# Patient Record
Sex: Female | Born: 1955 | Race: White | Hispanic: No | Marital: Married | Smoking: Never smoker
Health system: Southern US, Community
[De-identification: ages and names within clinical notes are randomized; demographics above are authoritative.]

## PROBLEM LIST (undated history)

## (undated) ENCOUNTER — Ambulatory Visit: Admission: EM | Disposition: A | Discharge status: Home / Self Care | Payer: 59

## (undated) DIAGNOSIS — B2790 Infectious mononucleosis, unspecified without complication: Secondary | ICD-10-CM

## (undated) DIAGNOSIS — C4490 Unspecified malignant neoplasm of skin, unspecified: Secondary | ICD-10-CM

## (undated) DIAGNOSIS — E039 Hypothyroidism, unspecified: Secondary | ICD-10-CM

## (undated) DIAGNOSIS — K929 Disease of digestive system, unspecified: Secondary | ICD-10-CM

## (undated) DIAGNOSIS — M810 Age-related osteoporosis without current pathological fracture: Secondary | ICD-10-CM

## (undated) DIAGNOSIS — E7800 Pure hypercholesterolemia, unspecified: Secondary | ICD-10-CM

## (undated) DIAGNOSIS — B379 Candidiasis, unspecified: Secondary | ICD-10-CM

## (undated) DIAGNOSIS — Z923 Personal history of irradiation: Secondary | ICD-10-CM

## (undated) HISTORY — DX: Age-related osteoporosis without current pathological fracture: M81.0

## (undated) HISTORY — DX: Disease of digestive system, unspecified: K92.9

## (undated) HISTORY — PX: BREAST SURGERY: SHX581

## (undated) HISTORY — PX: OTHER SURGICAL HISTORY: SHX169

## (undated) HISTORY — PX: TONSILLECTOMY: SUR1361

---

## 2009-05-09 ENCOUNTER — Encounter: Admission: RE | Admit: 2009-05-09 | Discharge: 2009-05-09 | Payer: Self-pay | Admitting: Gynecology

## 2012-02-28 ENCOUNTER — Other Ambulatory Visit: Payer: Self-pay | Admitting: Family Medicine

## 2012-02-28 DIAGNOSIS — Z1231 Encounter for screening mammogram for malignant neoplasm of breast: Secondary | ICD-10-CM

## 2012-03-12 ENCOUNTER — Ambulatory Visit
Admission: RE | Admit: 2012-03-12 | Discharge: 2012-03-12 | Disposition: A | Discharge status: Home / Self Care | Payer: BC Managed Care – PPO | Source: Ambulatory Visit | Attending: Family Medicine | Admitting: Family Medicine

## 2012-03-12 DIAGNOSIS — Z1231 Encounter for screening mammogram for malignant neoplasm of breast: Secondary | ICD-10-CM

## 2013-03-11 ENCOUNTER — Ambulatory Visit: Payer: BC Managed Care – PPO | Admitting: Internal Medicine

## 2014-02-03 ENCOUNTER — Other Ambulatory Visit: Payer: Self-pay

## 2014-03-30 ENCOUNTER — Encounter: Payer: Self-pay | Admitting: Internal Medicine

## 2014-03-30 ENCOUNTER — Ambulatory Visit (HOSPITAL_BASED_OUTPATIENT_CLINIC_OR_DEPARTMENT_OTHER)
Admission: RE | Admit: 2014-03-30 | Discharge: 2014-03-30 | Disposition: A | Discharge status: Home / Self Care | Payer: Managed Care, Other (non HMO) | Source: Ambulatory Visit | Attending: Internal Medicine | Admitting: Internal Medicine

## 2014-03-30 ENCOUNTER — Ambulatory Visit (INDEPENDENT_AMBULATORY_CARE_PROVIDER_SITE_OTHER): Payer: Managed Care, Other (non HMO) | Admitting: Internal Medicine

## 2014-03-30 VITALS — BP 95/60 | HR 80 | Temp 98.4°F | Resp 18 | Ht 64.0 in | Wt 111.0 lb

## 2014-03-30 DIAGNOSIS — N6019 Diffuse cystic mastopathy of unspecified breast: Secondary | ICD-10-CM

## 2014-03-30 DIAGNOSIS — N951 Menopausal and female climacteric states: Secondary | ICD-10-CM

## 2014-03-30 DIAGNOSIS — C4491 Basal cell carcinoma of skin, unspecified: Secondary | ICD-10-CM | POA: Insufficient documentation

## 2014-03-30 DIAGNOSIS — Z803 Family history of malignant neoplasm of breast: Secondary | ICD-10-CM

## 2014-03-30 DIAGNOSIS — E785 Hyperlipidemia, unspecified: Secondary | ICD-10-CM

## 2014-03-30 DIAGNOSIS — M542 Cervicalgia: Secondary | ICD-10-CM

## 2014-03-30 DIAGNOSIS — D126 Benign neoplasm of colon, unspecified: Secondary | ICD-10-CM

## 2014-03-30 DIAGNOSIS — M5030 Other cervical disc degeneration, unspecified cervical region: Secondary | ICD-10-CM | POA: Diagnosis not present

## 2014-03-30 DIAGNOSIS — M47812 Spondylosis without myelopathy or radiculopathy, cervical region: Secondary | ICD-10-CM | POA: Insufficient documentation

## 2014-03-30 DIAGNOSIS — M4120 Other idiopathic scoliosis, site unspecified: Secondary | ICD-10-CM | POA: Diagnosis not present

## 2014-03-30 DIAGNOSIS — Z139 Encounter for screening, unspecified: Secondary | ICD-10-CM

## 2014-03-30 DIAGNOSIS — M810 Age-related osteoporosis without current pathological fracture: Secondary | ICD-10-CM

## 2014-03-30 DIAGNOSIS — Z780 Asymptomatic menopausal state: Secondary | ICD-10-CM

## 2014-03-30 DIAGNOSIS — K635 Polyp of colon: Secondary | ICD-10-CM

## 2014-03-30 LAB — CBC WITH DIFFERENTIAL/PLATELET
BASOS ABS: 0 10*3/uL (ref 0.0–0.1)
BASOS PCT: 0 % (ref 0–1)
EOS ABS: 0.2 10*3/uL (ref 0.0–0.7)
Eosinophils Relative: 3 % (ref 0–5)
HEMATOCRIT: 40.9 % (ref 36.0–46.0)
Hemoglobin: 13.6 g/dL (ref 12.0–15.0)
Lymphocytes Relative: 26 % (ref 12–46)
Lymphs Abs: 1.9 10*3/uL (ref 0.7–4.0)
MCH: 30 pg (ref 26.0–34.0)
MCHC: 33.3 g/dL (ref 30.0–36.0)
MCV: 90.3 fL (ref 78.0–100.0)
MONO ABS: 0.6 10*3/uL (ref 0.1–1.0)
Monocytes Relative: 8 % (ref 3–12)
NEUTROS ABS: 4.7 10*3/uL (ref 1.7–7.7)
NEUTROS PCT: 63 % (ref 43–77)
Platelets: 280 10*3/uL (ref 150–400)
RBC: 4.53 MIL/uL (ref 3.87–5.11)
RDW: 12.4 % (ref 11.5–15.5)
WBC: 7.4 10*3/uL (ref 4.0–10.5)
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
## 2014-03-30 NOTE — Patient Instructions (Signed)
[facility] up [facility] D [facility]    Will refer to [facility]     See me in 2-3 weeks  Leave 30 mins

## 2014-03-31 ENCOUNTER — Telehealth: Payer: Self-pay | Admitting: *Deleted

## 2014-03-31 LAB — VITAMIN D 25 HYDROXY (VIT D DEFICIENCY, FRACTURES): Vit D, 25-Hydroxy: 96 ng/mL — ABNORMAL HIGH (ref 30–89)

## 2014-03-31 LAB — COMPREHENSIVE METABOLIC PANEL
ALBUMIN: 4.8 g/dL (ref 3.5–5.2)
ALT: 26 U/L (ref 0–35)
AST: 29 U/L (ref 0–37)
Alkaline Phosphatase: 90 U/L (ref 39–117)
BUN: 24 mg/dL — ABNORMAL HIGH (ref 6–23)
CALCIUM: 10.2 mg/dL (ref 8.4–10.5)
CHLORIDE: 103 meq/L (ref 96–112)
CO2: 27 mEq/L (ref 19–32)
Creat: 0.67 mg/dL (ref 0.50–1.10)
Glucose, Bld: 82 mg/dL (ref 70–99)
POTASSIUM: 4.5 meq/L (ref 3.5–5.3)
Sodium: 139 mEq/L (ref 135–145)
Total Bilirubin: 0.4 mg/dL (ref 0.2–1.2)
Total Protein: 7.5 g/dL (ref 6.0–8.3)

## 2014-03-31 LAB — LIPID PANEL
CHOLESTEROL: 261 mg/dL — AB (ref 0–200)
HDL: 93 mg/dL (ref >39)
LDL Cholesterol: 152 mg/dL — ABNORMAL HIGH (ref 0–99)
Total CHOL/HDL Ratio: 2.8 Ratio
Triglycerides: 82 mg/dL (ref <150)
VLDL: 16 mg/dL (ref 0–40)

## 2014-03-31 LAB — TSH: TSH: 2.073 u[IU]/mL (ref 0.350–4.500)

## 2014-03-31 NOTE — Telephone Encounter (Signed)
Left VM message for pt to return call.

## 2014-03-31 NOTE — Telephone Encounter (Signed)
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

## 2014-04-03 ENCOUNTER — Telehealth: Payer: Self-pay | Admitting: Internal Medicine

## 2014-04-03 DIAGNOSIS — Z780 Asymptomatic menopausal state: Secondary | ICD-10-CM | POA: Insufficient documentation

## 2014-04-03 NOTE — Telephone Encounter (Signed)
Paula [name]  Call pt and let her know that her cholesterol is quite high and I wish to see her in office to discuss this and see if she is losing weight.  Labs will be coming to her in the mail    Give her a 30 min office visit in one month  Message back with date of visit

## 2014-04-04 NOTE — Telephone Encounter (Signed)
Left a message for [place] to give [place] a call.

## 2014-04-07 ENCOUNTER — Other Ambulatory Visit: Payer: Self-pay | Admitting: Internal Medicine

## 2014-04-07 ENCOUNTER — Ambulatory Visit
Admission: RE | Admit: 2014-04-07 | Discharge: 2014-04-07 | Disposition: A | Discharge status: Home / Self Care | Payer: Managed Care, Other (non HMO) | Source: Ambulatory Visit | Attending: Internal Medicine | Admitting: Internal Medicine

## 2014-04-07 ENCOUNTER — Ambulatory Visit: Admission: RE | Admit: 2014-04-07 | Payer: Managed Care, Other (non HMO) | Source: Ambulatory Visit

## 2014-04-07 DIAGNOSIS — Z139 Encounter for screening, unspecified: Secondary | ICD-10-CM

## 2014-04-20 ENCOUNTER — Ambulatory Visit (INDEPENDENT_AMBULATORY_CARE_PROVIDER_SITE_OTHER): Payer: Managed Care, Other (non HMO) | Admitting: Internal Medicine

## 2014-04-20 ENCOUNTER — Encounter: Payer: Self-pay | Admitting: Internal Medicine

## 2014-04-20 VITALS — BP 99/58 | HR 76 | Resp 16 | Ht 64.0 in | Wt 113.0 lb

## 2014-04-20 DIAGNOSIS — E785 Hyperlipidemia, unspecified: Secondary | ICD-10-CM

## 2014-04-20 DIAGNOSIS — M542 Cervicalgia: Secondary | ICD-10-CM

## 2014-04-20 NOTE — Progress Notes (Signed)
Subjective:    Patient ID: Paula [name], female    DOB: 01-Dec-1955, 58 y.o.   MRN: [id number]  HPI  [name] is here for follow up  Neck pain is better  Xray neg  See lipids  She repeatedly declines RX meds    No Known Allergies Past Medical History  Diagnosis Date  . Osteoporosis   . Digestive system disease    Past Surgical History  Procedure Laterality Date  . Breast surgery      right breast biopsy   History   Social History  . Marital Status: Married    Spouse Name: N/A    Number of Children: N/A  . Years of Education: N/A   Occupational History  . Not on file.   Social History Main Topics  . Smoking status: Former Smoker    Quit date: 03/30/1976  . Smokeless tobacco: Never Used  . Alcohol Use: No  . Drug Use: No  . Sexual Activity: Yes    Birth Control/ Protection: Post-menopausal   Other Topics Concern  . Not on file   Social History Narrative  . No narrative on file   Family History  Problem Relation Age of Onset  . Cancer Mother 4    breast  . Cancer Father     pancreatic  . Cancer Sister 82    breast  . Heart disease Maternal Grandfather   . Cancer Paternal Grandmother 37    lymphoma  . Cancer Paternal Grandfather 44    stomach   Patient Active Problem List   Diagnosis Date Noted  . Menopause 04/03/2014  . Basal cell carcinoma of skin 03/30/2014  . Hyperlipidemia 03/30/2014  . Neck pain 03/30/2014  . Colon polyp 03/30/2014  . Family history of breast cancer in first degree relative  MGM, mother and sister 03/30/2014  . Fibrocystic breast disease 03/30/2014  . Osteoporosis  last Dexa  02/2012 03/30/2014   Current Outpatient Prescriptions on File Prior to Visit  Medication Sig Dispense Refill  . Ascorbic Acid (VITAMIN C) 100 MG tablet Take 100 mg by mouth 2 (two) times daily.      . cholecalciferol (VITAMIN D) 1000 UNITS tablet Take 1,000 Units by mouth daily.      . COD LIVER OIL PO Take 1 tablet by mouth daily.      Marland Kitchen GARLIC  OIL PO Take by mouth 3 (three) times daily with meals.      Marland Kitchen GLUTAMINE PO Take by mouth. For leaky gut      . MAGNESIUM CITRATE PO Take 200 mg by mouth 2 (two) times daily.      . niacin 50 MG tablet Take 50 mg by mouth at bedtime.      . OIL OF OREGANO PO Take by mouth 3 (three) times daily with meals.      Marland Kitchen OVER THE COUNTER MEDICATION 1 tablet 3 (three) times daily with meals. Digestive enzmyes      . thiamine (VITAMIN B-1) 50 MG tablet Take 50 mg by mouth 2 (two) times daily.      . TURMERIC PO Take 250 mg by mouth daily.      . vitamin E (VITAMIN E) 200 UNIT capsule Take 200 Units by mouth 2 (two) times daily.      Marland Kitchen VITAMIN K PO Take 2 mg by mouth daily.      Marland Kitchen ZINC ASPARTATE PO Take 15 mg by mouth daily. For leaky gut  No current facility-administered medications on file prior to visit.      Review of Systems See HPI    Objective:   Physical Exam Physical Exam  Nursing note and vitals reviewed.  Constitutional: She is oriented to person, place, and time. She appears well-developed and well-nourished.  HENT:  Head: Normocephalic and atraumatic.  Cardiovascular: Normal rate and regular rhythm. Exam reveals no gallop and no friction rub.  No murmur heard.  Pulmonary/Chest: Breath sounds normal. She has no wheezes. She has no rales.  Neurological: She is alert and oriented to person, place, and time.  Skin: Skin is warm and dry.  Psychiatric: She has a normal mood and affect. Her behavior is normal.        Assessment & Plan:  Hyperlipidemia :  Discussed red yeast rice and counseled pt it is very similar to statin and if she decides to start this, she is to see me in 8 weeks for hepatic profile  Neck pain  Improved  Keep CPE appt with me

## 2014-05-03 ENCOUNTER — Encounter: Payer: Self-pay | Admitting: Internal Medicine

## 2014-06-28 LAB — HM COLONOSCOPY

## 2014-07-06 ENCOUNTER — Encounter: Payer: BC Managed Care – PPO | Admitting: Internal Medicine

## 2014-08-29 ENCOUNTER — Encounter: Payer: Self-pay | Admitting: Internal Medicine

## 2015-04-27 ENCOUNTER — Other Ambulatory Visit: Payer: Self-pay | Admitting: Nurse Practitioner

## 2015-04-27 DIAGNOSIS — Z1231 Encounter for screening mammogram for malignant neoplasm of breast: Secondary | ICD-10-CM

## 2016-10-17 ENCOUNTER — Other Ambulatory Visit: Payer: Self-pay | Admitting: Family Medicine

## 2016-10-17 DIAGNOSIS — Z1231 Encounter for screening mammogram for malignant neoplasm of breast: Secondary | ICD-10-CM

## 2016-11-08 ENCOUNTER — Ambulatory Visit: Payer: Managed Care, Other (non HMO)

## 2016-12-05 HISTORY — PX: BREAST LUMPECTOMY: SHX2

## 2016-12-23 ENCOUNTER — Encounter: Payer: Self-pay | Admitting: Radiation Oncology

## 2017-01-01 ENCOUNTER — Institutional Professional Consult (permissible substitution): Payer: Managed Care, Other (non HMO) | Admitting: Radiation Oncology

## 2017-01-03 NOTE — Progress Notes (Signed)
Location of Breast Cancer: Right Breast  Histology per Pathology Report:  12/05/16 at [facility]: Right Axilla, sentinel lymph node, biopsy - Two lymph nodes negative for carcinoma (0,2)  B:Breast, right, needle-localized partial mastectomy -Invasive ductal carcinoma, multifocal (see synoptic report) -Tumor size 56m, 7 mm and 3 mm (see comment) -Ductal carcinoma in situ, nuclear grade 3, solid type with microcalcifications   Receptor Status: ER(NEG), PR (NEG), Her2-neu (NEG), Ki-()  Did patient present with symptoms or was this found on screening mammography?: It was found on a screening mammgram.   Past/Anticipated interventions by surgeon, if any: 12/05/16 Procedure(s): MASTECTOMY, PARTIAL (EG, LUMPECTOMY, TYLECTOMY, QUADRANTECTOMY, SEGMENTECTOMY)  BX/EXC LYMPH NODE; OPEN, DEEP AXILRY NODE  INTRAOPERATIVE IDENTIFICATION SENTINEL LYMPH NODE(S) INCLUDE INJECTION NON-RADIOACTIVE DYE, WHEN PERFORMED  Note: Revisions to procedures should be made in chart - see Procedures tab.  Performing Service: Surgical Oncology Surgeon(s) and Role: * Kristalyn KDoran Stabler DO - Primary * BOswaldo Milian MD - Resident - Assisting   Lymphedema issues, if any:  She denies. She has good arm mobility  Pain issues, if any:  She denies.   SAFETY ISSUES:  Prior radiation? No  Pacemaker/ICD? No  Possible current pregnancy? No  Is the patient on methotrexate? No  Current Complaints / other details:   BP 94/67   Pulse 87   Temp 98.6 F (37 C)   Ht _0  (1.626 m)   Wt 103 lb (46.7 kg)   SpO2 97% Comment: room air  BMI 17.68 kg/m    Wt Readings from Last 3 Encounters:  01/07/17 103 lb (46.7 kg)  04/20/14 113 lb (51.3 kg)  03/30/14 111 lb (50.3 kg)      Jamarious Febo, JStephani Police RN 01/03/2017,1:36 PM

## 2017-01-07 ENCOUNTER — Ambulatory Visit
Admission: RE | Admit: 2017-01-07 | Discharge: 2017-01-07 | Disposition: A | Discharge status: Home / Self Care | Payer: Managed Care, Other (non HMO) | Source: Ambulatory Visit | Attending: Radiation Oncology | Admitting: Radiation Oncology

## 2017-01-07 ENCOUNTER — Encounter: Payer: Self-pay | Admitting: Radiation Oncology

## 2017-01-07 VITALS — BP 94/67 | HR 87 | Temp 98.6°F | Ht 64.0 in | Wt 103.0 lb

## 2017-01-07 DIAGNOSIS — C50911 Malignant neoplasm of unspecified site of right female breast: Secondary | ICD-10-CM

## 2017-01-07 DIAGNOSIS — E039 Hypothyroidism, unspecified: Secondary | ICD-10-CM | POA: Diagnosis not present

## 2017-01-07 DIAGNOSIS — R079 Chest pain, unspecified: Secondary | ICD-10-CM | POA: Diagnosis not present

## 2017-01-07 DIAGNOSIS — Z510 Encounter for antineoplastic radiation therapy: Secondary | ICD-10-CM | POA: Insufficient documentation

## 2017-01-07 DIAGNOSIS — C50411 Malignant neoplasm of upper-outer quadrant of right female breast: Secondary | ICD-10-CM

## 2017-01-07 DIAGNOSIS — E7800 Pure hypercholesterolemia, unspecified: Secondary | ICD-10-CM | POA: Diagnosis not present

## 2017-01-07 DIAGNOSIS — M810 Age-related osteoporosis without current pathological fracture: Secondary | ICD-10-CM | POA: Diagnosis not present

## 2017-01-07 DIAGNOSIS — Z87891 Personal history of nicotine dependence: Secondary | ICD-10-CM | POA: Diagnosis not present

## 2017-01-07 DIAGNOSIS — Z85828 Personal history of other malignant neoplasm of skin: Secondary | ICD-10-CM | POA: Insufficient documentation

## 2017-01-07 DIAGNOSIS — Z809 Family history of malignant neoplasm, unspecified: Secondary | ICD-10-CM | POA: Insufficient documentation

## 2017-01-07 DIAGNOSIS — Z171 Estrogen receptor negative status [ER-]: Secondary | ICD-10-CM | POA: Diagnosis not present

## 2017-01-07 HISTORY — DX: Hypothyroidism, unspecified: E03.9

## 2017-01-07 HISTORY — DX: Unspecified malignant neoplasm of skin, unspecified: C44.90

## 2017-01-07 HISTORY — DX: Infectious mononucleosis, unspecified without complication: B27.90

## 2017-01-07 HISTORY — DX: Candidiasis, unspecified: B37.9

## 2017-01-07 HISTORY — DX: Pure hypercholesterolemia, unspecified: E78.00

## 2017-01-07 NOTE — Progress Notes (Signed)
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 female who presented with an abnormal right mammogram last year.  She opted to follow this with a thermogram instead of a biopsy at that time.  The thermogram was apparently reassuring.  However, she developed arm pain months later and pursued a repeat thermogram which was concerning, followed by another right breast mammogram.  On 10/25/16  Bilateral diagnostic mammogram with tomosynthesis: segmental distribution of puncture with spanning a 3 x 3 x 7 cm. There is an irregular 7 mm mass over the posterior third of the upper outer right breast with few associated microcalcifications, as this mass lies along the posterior edge of the previously described segmental microcalcifications. Targeted ultrasound revealed a bilobed hypoechoic mass with mild border irregularity over the 10:30 position of the right breast; ultrasound of the right axilla demonstrates no abnormal lymph nodes.   On 11/06/16 RIGHT breast biopsy: ER/PR-, HER 2 non amplified (ratio 0.9). 10:30: invasive carcinoma, ductal, associate with high grade DCIS. 11:00 4cm from nipple: invasive carcinoma, favor metaplastic carcinoma.   She underwent  Right lumpectomy and  SLN dissection on 12-05-16 at [facility] showing multifocal breast cancer - pT1b (81m, 749mand 3m33m pN0(0/2).   Path summary from surgery: A: Right axilla, sentinel lymph node, biopsy - Two lymph nodes negative for carcinoma (0/2)  B: Breast, right, needle-localized partial mastectomy - Invasive ductal carcinoma, multifocal (see synoptic report) - Tumor size: 9 mm, 7 mm and 3 mm (see comment) - Ductal carcinoma in situ, nuclear grade 3, solid type with microcalcifications - Biopsy sites (x2) identified - Margin status for specimen B only (for final margin status see specimens C-G)  Invasive carcinoma: Negative; 0.2 mm from anterior margin; 0.8 mm from inferior margin  Ductal carcinoma in situ: Positive inferior, medial, lateral and posterior margins; 0.2 mm from anterior margin; 0.6 from superior margin - Ancillary studies previously reported on [facility] biopsies ([facility] [id number], 9 cm from nipple: Estrogen receptor: Negative Progesterone receptor: Negative HER2 IHC: Negative (1+) 11:00, 4 cm from nipple: Estrogen receptor: Negative Progesterone receptor: Negative HER2 IHC: Equivocal (2+) HER2 FISH: Not amplified HER2/CEP17 ratio: 0.9 Mean HER2 copy number: 2.7 - Ancillary studies for 3 mm tumor (see biomarker synoptic report)  Estrogen receptor: Negative  Progesterone receptor: Negative  HER2 IHC: Equivocal (2+)  C: Breast, right, medial margin, excision - Negative for carcinoma   D: Breast, right, lateral margin, excision - Negative for carcinoma  E: Breast, right, anterior margin, excision  - Ductal carcinoma in situ, nuclear grade 3, solid type - Margin: Negative, 0.5 mm  F: Breast, right, inferior margin, excision  - Ductal carcinoma in situ, nuclear grade 3, solid type - Margin: Negative, 0.2 mm  G: Breast, right, superior margin, excision  - Negative for carcinoma  Chemotherapy recommended at one university and less favored at another.  I  only have the [facility] notes on hand (recommend dose dense AC --> taxol). Patient is undecided.   Genetic testing was conducted as patient's mother and two sisters with breast cancer, all tested negative. Sister with VUS in [place]. Patient revealed BRCA2 variant of uncertain significance (VUS) known as [id number] ([id number]).  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  She's not seen GI or infectious disease specialists, she reports.  Gynecologic History: Period in the last 6 months: no Age of menarche: 79 Number of pregnancies: 0 Age of first term pregancy: 0 Gravida: 0  Para: 0 Breastfeeding status: 0 Date of last pelvic/pap smear: 2017  OCP History: y Fertility drug use: maybe, not sure but possibly clomid  Menopause caused by: Natural menopause Age at menopause: 34 Hormone replacement therapy: no   PREVIOUS RADIATION THERAPY: No  PAST MEDICAL HISTORY:  has a past medical history of Candida infection; Digestive system disease; [name] infection; Hypercholesteremia; Hypothyroid; Osteoporosis; and Skin cancer.    PAST SURGICAL HISTORY: Past Surgical History:  Procedure Laterality Date  . basal cell skin cancer     to right side of face two times  . BREAST SURGERY     right breast biopsy  . TONSILLECTOMY     as a child    FAMILY HISTORY: family history includes Cancer in her father; Cancer (age of onset: 10) in her paternal grandfather; Cancer (age of onset: 71) in her sister; Cancer (age of onset: 53) in her paternal grandmother; Cancer (age of onset: 36) in her mother; Heart disease in her maternal grandfather.  SOCIAL HISTORY:  reports that she quit smoking about 40 years ago. She has never used smokeless tobacco. She reports that she drinks alcohol. She reports that she does not use drugs.  ALLERGIES: Patient has no known allergies.  MEDICATIONS:  Current Outpatient  Prescriptions  Medication Sig Dispense Refill  . Ascorbic Acid (VITAMIN C) 100 MG tablet Take 100 mg by mouth 2 (two) times daily.    . cholecalciferol (VITAMIN D) 1000 UNITS tablet Take 1,000 Units by mouth daily.    Marland Kitchen GLUTAMINE PO Take by mouth. For leaky gut    . MAGNESIUM CITRATE PO Take 200 mg by mouth 2 (two) times daily.    . niacin 50 MG tablet Take 50 mg by mouth at bedtime.    . OIL OF OREGANO PO Take by mouth 3 (three) times daily with meals.    Marland Kitchen OVER THE COUNTER MEDICATION 1 tablet 3 (three) times daily with meals. Digestive enzmyes    . TURMERIC PO Take 250 mg by mouth daily.    . vitamin E (VITAMIN E) 200 UNIT capsule Take 200 Units by mouth 2 (two) times daily.    Marland Kitchen VITAMIN K PO Take 2 mg by mouth daily.    Marland Kitchen ZINC ASPARTATE PO Take 15 mg by mouth daily. For leaky gut    . co-enzyme Q-10 50 MG capsule Take 50 mg by mouth.    . Selenium (V-R SELENIUM) 200 MCG TABS Take by mouth.     No current facility-administered medications for this encounter.     REVIEW OF SYSTEMS: as above  PHYSICAL EXAM:  height is _0  (1.626 m) and weight is 103 lb (46.7 kg). Her temperature is 98.6 F (37 C). Her blood pressure is 94/67 and her  pulse is 87. Her oxygen saturation is 97%.   General: Alert and oriented, in no acute distress HEENT: Head is normocephalic. Extraocular movements are intact. Oropharynx is clear. Neck: Neck is supple, no palpable cervical or supraclavicular lymphadenopathy. Heart: Regular in rate and rhythm with no murmurs, rubs, or gallops. Chest: Clear to auscultation bilaterally, with no rhonchi, wheezes, or rales. Abdomen: Soft, nontender, nondistended, with no rigidity or guarding. Extremities: No cyanosis or edema. Lymphatics: see Neck Exam Skin: No concerning lesions. Musculoskeletal: symmetric strength and muscle tone throughout. Neurologic: Cranial nerves II through XII are grossly intact. No obvious focalities. Speech is fluent. Coordination is  intact. Psychiatric: Judgment and insight are intact. Affect is appropriate. Breasts: healing well from recent right lumpectomy and SLN biopsy.  No oozing from scars . No other masses appreciated in the breasts or axillae bilateraly .   ECOG = 0  0 - Asymptomatic (Fully active, able to carry on all predisease activities without restriction)  1 - Symptomatic but completely ambulatory (Restricted in physically strenuous activity but ambulatory and able to carry out work of a light or sedentary nature. For example, light housework, office work)  2 - Symptomatic, <50% in bed during the day (Ambulatory and capable of all self care but unable to carry out any work activities. Up and about more than 50% of waking hours)  3 - Symptomatic, >50% in bed, but not bedbound (Capable of only limited self-care, confined to bed or chair 50% or more of waking hours)  4 - Bedbound (Completely disabled. Cannot carry on any self-care. Totally confined to bed or chair)  5 - Death   Eustace Pen MM, Creech RH, Tormey DC, et al. [phone]). "Toxicity and response criteria of the [facility]". [place] Oncol. 5 (6): 649-55   LABORATORY DATA:  Lab Results  Component Value Date   WBC 7.4 03/30/2014   HGB 13.6 03/30/2014   HCT 40.9 03/30/2014   MCV 90.3 03/30/2014   PLT 280 03/30/2014   CMP     Component Value Date/Time   NA 139 03/30/2014 1530   K 4.5 03/30/2014 1530   CL 103 03/30/2014 1530   CO2 27 03/30/2014 1530   GLUCOSE 82 03/30/2014 1530   BUN 24 (H) 03/30/2014 1530   CREATININE 0.67 03/30/2014 1530   CALCIUM 10.2 03/30/2014 1530   PROT 7.5 03/30/2014 1530   ALBUMIN 4.8 03/30/2014 1530   AST 29 03/30/2014 1530   ALT 26 03/30/2014 1530   ALKPHOS 90 03/30/2014 1530   BILITOT 0.4 03/30/2014 1530       RADIOGRAPHY: as above    IMPRESSION/PLAN: triple negative right breast cancer  It was a pleasure meeting the patient today. We discussed the risks, benefits, and side  effects of radiotherapy. I recommend radiotherapy to the right breast to reduce her risk of locoregional recurrence by 2/3.  We discussed that radiation would take approximately 4-6 weeks to complete and that I would give the patient a few weeks to heal following surgery before starting treatment planning.  If chemotherapy were to be given, this would precede radiotherapy. We spoke about acute effects including skin irritation and fatigue as well as much less common late effects including internal organ injury or irritation. We spoke about the latest technology that is used to minimize the risk of late effects for patients undergoing radiotherapy to the breast or chest wall. No guarantees of treatment were given. The patient is enthusiastic about proceeding with treatment. I look forward  to participating in the patient's care.  I will await her referral back to me for postoperative follow-up and eventual CT simulation/treatment planning. The patient has my contact card and may also call, herself, after she decides upon chemotherapy.  A total of 3 medically necessary complex treatment devices will be fabricated and supervised by me: 2 fields with MLCs for custom blocks to protect heart, and lungs;  and, a Vac-lok. MORE COMPLEX DEVICES MAY BE MADE IN DOSIMETRY FOR FIELD IN FIELD BEAMS FOR DOSE HOMOGENEITY.  I have requested : 3D Simulation which is medically necessary to give adequate dose to at risk tissues while sparing lungs and heart.  I have requested a DVH of the following structures: lungs, heart, right lumpectomy cavity.    All of this being said, she is still undecided on chemotherapy.  She feels split between the opinions of [facility] and [place].  I cannot see the [facility] consult note but I see [facility]'s opinion is favoring systemic therapy.  I explained the concerning biologics of her cancer to her.  She would like to see a medical oncologist here, as it may provide a "tie breaker" opinion; and, she'll likely want  local therapy if pursued. I will refer to Dr Burr Medico; she is a Social research officer, government and I think she will be an excellent fit for the patient.  I will also refer to social work due to acute distress.  I advised the patient to talk to Dr Burr Medico about her parasitic condition. This may warrant an ID consult, especially before chemotherapy which can be immunosuppressive.  The patient had dozens of questions which I answering to her satisfaction. I gave her my contact information if more questions arise.  I spent >60 minutes  face to face with the patient and more than 50% of that time was spent in counseling and/or coordination of care.   __________________________________________   Eppie Gibson, MD

## 2017-01-08 ENCOUNTER — Telehealth: Payer: Self-pay | Admitting: Hematology

## 2017-01-08 DIAGNOSIS — C50411 Malignant neoplasm of upper-outer quadrant of right female breast: Secondary | ICD-10-CM | POA: Insufficient documentation

## 2017-01-08 NOTE — Telephone Encounter (Signed)
Cld pt to schedule an appt for her to see Dr. Burr Medico on 3/15 at 230pm. Pt aware to arrive 15 minutes early.

## 2017-01-09 ENCOUNTER — Encounter: Payer: Self-pay | Admitting: Hematology

## 2017-01-09 ENCOUNTER — Telehealth: Payer: Self-pay | Admitting: *Deleted

## 2017-01-09 ENCOUNTER — Ambulatory Visit (HOSPITAL_BASED_OUTPATIENT_CLINIC_OR_DEPARTMENT_OTHER): Payer: Managed Care, Other (non HMO) | Admitting: Hematology

## 2017-01-09 VITALS — BP 106/65 | HR 79 | Temp 98.7°F | Resp 18 | Ht 64.0 in | Wt 103.0 lb

## 2017-01-09 DIAGNOSIS — C50411 Malignant neoplasm of upper-outer quadrant of right female breast: Secondary | ICD-10-CM | POA: Diagnosis not present

## 2017-01-09 DIAGNOSIS — A071 Giardiasis [lambliasis]: Secondary | ICD-10-CM

## 2017-01-09 DIAGNOSIS — M818 Other osteoporosis without current pathological fracture: Secondary | ICD-10-CM | POA: Diagnosis not present

## 2017-01-09 DIAGNOSIS — B719 Cestode infection, unspecified: Secondary | ICD-10-CM | POA: Diagnosis not present

## 2017-01-09 DIAGNOSIS — Z171 Estrogen receptor negative status [ER-]: Secondary | ICD-10-CM | POA: Diagnosis not present

## 2017-01-09 DIAGNOSIS — E039 Hypothyroidism, unspecified: Secondary | ICD-10-CM | POA: Diagnosis not present

## 2017-01-09 NOTE — Progress Notes (Signed)
[place]  [phone] [phone]  Clinic New Consult Note   Patient Care Team: Lanice Shirts, MD as PCP - General (Internal Medicine) 01/09/2017  Referring physician: Dr. Isidore Moos  Oncology History   Cancer Staging Malignant neoplasm of upper outer quadrant of female breast [facility]) Staging form: Breast, AJCC 8th Edition - Clinical: No stage assigned - Unsigned - Pathologic: pT1b, pN0, cM0, ER: Negative, PR: Negative, HER2: Negative - Signed by Eppie Gibson, MD on 01/08/2017       Malignant neoplasm of upper outer quadrant of female breast ([place])   02/01/2016 Mammogram    Breast density category B. There are new punctate calcification at the 11:00 position of right breast, highly suspicious for malignancy. Patient refused biopsy      12/05/2016 Initial Diagnosis    Malignant neoplasm of upper outer quadrant of female breast ([place])      12/05/2016 Surgery    Right breast lumpectomy and sentinel lymph node biopsy by Dr. Ernst Bowler at [facility]       12/05/2016 Pathology Results    Right breast lumpectomy and a sentinel lymph node biopsy showed invasive ductal carcinoma, multifocal, tumor size 9 mm, 7 mm and 3 mm. Grade 3. High-grade DCIS with microcalcifications. Final margins were negative for invasive and DCIS. 2 sentinel lymph nodes were negative.      12/05/2016 Receptors her2    ER, PR and HER-2 triple negative       CHIEF COMPLAINTS/PURPOSE OF CONSULTATION:  Recently diagnosed Right Breast cancer, triple Negative, Stage pT1b, pN0  HISTORY OF PRESENTING ILLNESS:  Paula [name] 61 y.o. female is here because of recently diagnosed right multifocal ductal carcinoma, triple negative.  She is s/p right lumpectomy and node biopsy on 12/05/2016. She was referred by her radiation oncologist Dr. Isidore Moos. She presents to my clinic by herself today.  The patient had a routine mammogram on 01/2016. The findings demonstrated a group of indeterminate  microcalcification over the upper outer right breast for which  A biopsy was recommended, which was declined at the time. The patient opted to pursue a thermogram instead that was reported as normal. A few months after, while walking her dog, and felt pain in her arm. A repeat thermogram was done and showed an abnormality on the opposite side. She then decided to repeat a mammogram.   Repeat mammogram on 10/25/16 showed an irregular 7 mm mass over the posterior third of the upper right breast with few associated microcalcifications, as this mass lies along the posterior edge of the previously described segmental microclacifcations. Targeted ultrasound showed bilobed hypoechoic mass with mild border irregularly over the 10:30 position of the right breast 9 cm from the nipple corresponding to the mass with the border irregularity.  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  Right lumpectomy and sentinel node dissection on 12/05/16 with Dr. Ernst Bowler revealed multifocal breast cancer - pT1b, pN0.  The patient presents today to discuss the roll that chemotherapy may have to help with her disease. Patient does not live in [place], but in [place] She reports her sisters have experienced the same thing, which has resulted in [place], Stage 0. She is here to get another opinion, due to her cancer being triple negative. She denies any pain, swelling and has full ROM. Patient does yoga and walk to help. Also denies fever. Patient does not smoke. She does  not want to take calcium because she thinks it may have cause colon polyps.   Patient reports history of parasites which was found 20 years ago and tests are still positive. She also has history of EBV virus infection.  Patient is strongly believe homeopathic and alternative medicine, she takes multiple vitamin and  herbal supplements.    MEDICAL HISTORY:  Past Medical History:  Diagnosis Date  . Candida infection    in bowels  . 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 [name] infection   . Hypercholesteremia   . Hypothyroid   . Osteoporosis   . Skin cancer    right side of face basal cell    SURGICAL HISTORY: Past Surgical History:  Procedure Laterality Date  . basal cell skin cancer     to right side of face two times  . BREAST SURGERY     right breast biopsy  . TONSILLECTOMY     as a child    SOCIAL HISTORY: Social History   Social History  . Marital status: Married    Spouse name: N/A  . Number of children: N/A  . Years of education: N/A   Occupational History  . Not on file.   Social History Main Topics  . Smoking status: Former Smoker    Packs/day: 0.25    Years: 2.00    Quit date: 03/30/1976  . Smokeless tobacco: Never Used     Comment: she smoked socially as a teenager  . Alcohol use Yes     Comment: rare wine use  . Drug use: No  . Sexual activity: Yes    Birth control/ protection: Post-menopausal   Other Topics Concern  . Not on file   Social History Narrative  . No narrative on file    FAMILY HISTORY: Family History  Problem Relation Age of Onset  . Cancer Mother 55    breast  . Cancer Father     pancreatic  . Cancer Sister 60    breast  . Heart disease Maternal Grandfather   . Cancer Paternal Grandmother 38    lymphoma  . Cancer Paternal Grandfather 28    stomach  . Cancer Maternal Grandmother 95    breast cancer   . Cancer Sister 23    breast cancer   2 children, not biological  ALLERGIES:  has No Known Allergies.  MEDICATIONS:  Current Outpatient Prescriptions  Medication Sig Dispense Refill  . Ascorbic Acid (VITAMIN C) 100 MG tablet Take 100 mg by mouth 2 (two) times daily.    . cholecalciferol (VITAMIN D) 1000 UNITS tablet Take 1,000 Units by mouth daily.    Marland Kitchen co-enzyme Q-10 50 MG capsule Take 50 mg by  mouth.    Marland Kitchen GLUTAMINE PO Take by mouth. For leaky gut    . MAGNESIUM CITRATE PO Take 200 mg by mouth 2 (two) times daily.    . niacin 50 MG tablet Take 50 mg by mouth at bedtime.    . OIL OF OREGANO PO Take by mouth 3 (three) times daily with meals.    Marland Kitchen OVER THE COUNTER MEDICATION 1 tablet 3 (three) times daily with meals. Digestive enzmyes    . Selenium (V-R SELENIUM) 200 MCG TABS Take by mouth.    . TURMERIC PO Take 250 mg by mouth daily.    . vitamin E (VITAMIN E) 200 UNIT capsule Take 200 Units by mouth 2 (two) times daily.    Marland Kitchen VITAMIN  K PO Take 2 mg by mouth daily.    Marland Kitchen ZINC ASPARTATE PO Take 15 mg by mouth daily. For leaky gut     No current facility-administered medications for this visit.     REVIEW OF SYSTEMS:   Constitutional: Denies fevers, chills or abnormal night sweats Eyes: Denies blurriness of vision, double vision or watery eyes Ears, nose, mouth, throat, and face: Denies mucositis or sore throat Respiratory: Denies cough, dyspnea or wheezes Cardiovascular: Denies palpitation, chest discomfort or lower extremity swelling Gastrointestinal:  Denies nausea, heartburn or change in bowel habits Skin: Denies abnormal skin rashes Lymphatics: Denies new lymphadenopathy or easy bruising Neurological:Denies numbness, tingling or new weaknesses Behavioral/Psych: Mood is stable, no new changes  All other systems were reviewed with the patient and are negative.  PHYSICAL EXAMINATION:  ECOG PERFORMANCE STATUS: 0 - Asymptomatic  Vitals:   01/09/17 1515  BP: 106/65  Pulse: 79  Resp: 18  Temp: 98.7 F (37.1 C)   Filed Weights   01/09/17 1515  Weight: 103 lb (46.7 kg)    GENERAL:alert, no distress and comfortable SKIN: skin color, texture, turgor are normal, no rashes or significant lesions EYES: normal, conjunctiva are pink and non-injected, sclera clear OROPHARYNX:no exudate, no erythema and lips, buccal mucosa, and tongue normal  NECK: supple, thyroid normal size,  non-tender, without nodularity LYMPH:  no palpable lymphadenopathy in the cervical, axillary or inguinal LUNGS: clear to auscultation and percussion with normal breathing effort HEART: regular rate & rhythm and no murmurs and no lower extremity edema ABDOMEN:abdomen soft, non-tender and normal bowel sounds Musculoskeletal:no cyanosis of digits and no clubbing  PSYCH: alert & oriented x 3 with fluent speech NEURO: no focal motor/sensory deficits BREAST: No discharge in incision in right breast above areola well healed   LABORATORY DATA:  I have reviewed the data as listed CBC Latest Ref Rng & Units 03/30/2014  WBC 4.0 - 10.5 K/uL 7.4  Hemoglobin 12.0 - 15.0 g/dL 13.6  Hematocrit 36.0 - 46.0 % 40.9  Platelets 150 - 400 K/uL 280   CMP Latest Ref Rng & Units 03/30/2014  Glucose 70 - 99 mg/dL 82  BUN 6 - 23 mg/dL 24(H)  Creatinine 0.50 - 1.10 mg/dL 0.67  Sodium 135 - 145 mEq/L 139  Potassium 3.5 - 5.3 mEq/L 4.5  Chloride 96 - 112 mEq/L 103  CO2 19 - 32 mEq/L 27  Calcium 8.4 - 10.5 mg/dL 10.2  Total Protein 6.0 - 8.3 g/dL 7.5  Total Bilirubin 0.2 - 1.2 mg/dL 0.4  Alkaline Phos 39 - 117 U/L 90  AST 0 - 37 U/L 29  ALT 0 - 35 U/L 26   PATHOLOGY REPORT  See onc history   RADIOGRAPHIC STUDIES: I have personally reviewed the radiological images as listed and agreed with the findings in the report. No results found.  ASSESSMENT & PLAN: 61 year old Caucasian female, postmenopausal  1. 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 -She had early stage breast cancer, had a complete surgical resection. -We discussed the risk of cancer recurrence after surgical resection. We reviewed the natural history of triple negative disease, which is more aggressive. She probably has moderate to high risk  of recurrence, estimated distant recurrence risk about 20-30%.  -Given her multifocal disease, with the largest 0.9 cm, and the triple negative disease, I do recommend  adjuvant chemotherapy. We discussed Reiss option of chemotherapy regiment, including standard AC-T, or TC 4-6 cycles, or single agent with Taxol or Taxotere. Due to her stage IA disease, I think the benefit of TC (docetaxel and Cytoxan) 4-6 cycles is probably similar to Adriamycin-based chemotherapy regimen. Patient is very concerned about side effects from chemotherapy, I think TCx4 is probably the best fit for her. - Patient has visited multiple oncologist to get opinion of taking chemotherapy, but has not determined if she wants to take chemo or not - Patient fears being over treated, would prefer a less intensive chemo if she takes it -He has been 5 weeks since her breast surgery, I encouraged her to make a decision soon. I strongly recommend her to start adjuvant chemotherapy as well as possible. We usually recommend adjuvant chemotherapy starts within 8 weeks of surgery. -She has seen radiation oncologist Dr. Isidore Moos, and agreed to take adjuvant breast radiation. We discussed that adjuvant chemotherapy is usually given before radiation. - I encouraged her to take chemotherapy as soon as possible, to help her make decision.  -After lengthy discussion, patient agrees to take the chemotherapy class, and call me next week about her final decision on chemotherapy -Patient takes multiple vitamins and herbs supplements, I may ask our pharmacist to check the interaction with chemotherapy, if she decides to do chemotherapy  2. Genetic Testing A. Mother and two sisters with breast cancer, all tested negative. Sister with VUS in [place]. B. 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 - Father had pancreatic cancer   3. 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 She is being treated by Dr.  Julien Nordmann at [facility] in [place].  -I will refer her to see infection disease specialist if she decides to take chemotherapy.   Plan:   -she will schedule chemo class - pt will consider options and contact us with her decisions on chemo next week. If she decides against chemo, she will start radiation soon.  -she will call us next week.    No orders of the defined types were placed in this encounter.   All questions were answered. The patient knows to call the clinic with any problems, questions or concerns.  I spent 55 minutes counseling the patient face to face. The total time spent in the appointment was 60 minutes and more than 50% was on counseling.  This document serves as a record of services personally performed by Truitt Merle, MD. It was created on her behalf by Brandt Loosen, a trained medical scribe. The creation of this record is based on the scribe's personal observations and the provider's statements to them. This document has been checked and approved by the attending provider.     Truitt Merle, MD 01/09/2017

## 2017-01-09 NOTE — Telephone Encounter (Signed)
PATIENT HAD AN APPT. WITH DR. FENG ON 01-09-17 @ 2:30 PM, PT. CAME TO APPT.

## 2017-01-10 ENCOUNTER — Encounter: Payer: Self-pay | Admitting: Hematology

## 2017-01-13 ENCOUNTER — Telehealth: Payer: Self-pay | Admitting: *Deleted

## 2017-01-13 ENCOUNTER — Telehealth: Payer: Self-pay

## 2017-01-13 NOTE — Telephone Encounter (Signed)
Pt called with questions: PORT vs peripheral, ice on fingers and toes, how long is a treatment in infusion. Does she need a driver. We discussed port/peripheral, ice on fingers and toes and treatment time.  She is concerned about taking steroids and the effects on osteoporosis and bone density. She already has osteoporosis and is worried also that the steroids will worsen the possible bone mets.  She is asking if she can go without the steroids? Would you use a biphosphonate? And how often? Does she need a bone density test presently?

## 2017-01-13 NOTE — Telephone Encounter (Signed)
I called her back and left a message, encouraged her to call back tomorrow. I will also try tomorrow.   Truitt Merle MD

## 2017-01-13 NOTE — Telephone Encounter (Signed)
Called pt to give navigation resources and contact information.

## 2017-01-14 ENCOUNTER — Other Ambulatory Visit: Payer: Self-pay | Admitting: Hematology

## 2017-01-14 ENCOUNTER — Encounter: Payer: Self-pay | Admitting: Hematology

## 2017-01-14 ENCOUNTER — Telehealth: Payer: Self-pay | Admitting: *Deleted

## 2017-01-14 ENCOUNTER — Telehealth: Payer: Self-pay | Admitting: Hematology

## 2017-01-14 DIAGNOSIS — M8580 Other specified disorders of bone density and structure, unspecified site: Secondary | ICD-10-CM

## 2017-01-14 DIAGNOSIS — C50411 Malignant neoplasm of upper-outer quadrant of right female breast: Secondary | ICD-10-CM

## 2017-01-14 DIAGNOSIS — Z171 Estrogen receptor negative status [ER-]: Secondary | ICD-10-CM

## 2017-01-14 MED ORDER — ONDANSETRON HCL 8 MG PO TABS: 8.0000 mg | ORAL_TABLET | Freq: Two times a day (BID) | ORAL | 1 refills | Status: DC | PRN

## 2017-01-14 MED ORDER — PROCHLORPERAZINE MALEATE 10 MG PO TABS: 10.0000 mg | ORAL_TABLET | Freq: Four times a day (QID) | ORAL | 1 refills | Status: DC | PRN

## 2017-01-14 NOTE — Telephone Encounter (Signed)
Return pt call regarding starting chemotherapy. After looking at her calendar she would like to start on 01/16/17. Informed pt that we need to get the authorization from her insurance company first as well as ensure there is availability in the infusion room. Scheduled pt for chemo education on 01/15/17. Informed pt that I would reach out the prior authorization specialist as well as infusion room to see if there was availability. Received verbal understanding.

## 2017-01-14 NOTE — Progress Notes (Signed)
START ON PATHWAY REGIMEN - Breast     A cycle is every 21 days:     Docetaxel      Cyclophosphamide   **Always confirm dose/schedule in your pharmacy ordering system**    Patient Characteristics: Adjuvant Therapy, Node Negative, HER2/neu Negative/Unknown/Equivocal, ER Negative AJCC Stage Grouping: IA Current Disease Status: No Distant Mets or Local Recurrence AJCC M Stage: X ER Status: Negative (-) AJCC N Stage: X AJCC T Stage: X HER2/neu: Negative (-) PR Status: Negative (-) Node Status: Negative (-)  Intent of Therapy: Curative Intent, Discussed with Patient

## 2017-01-14 NOTE — Progress Notes (Signed)
Called patient to introduce myself as her Estate manager/land agent and to see if she had any financial questions or concerns. Asked patient if she was familiar with her deductible/OOP for her insurance plan. Patient states her deductible has been met and she doesn't have OOP.She thinks once her deductible has been met, her insurance pays at 100%. Gave patient my name and number should she have any questions or concerns.

## 2017-01-14 NOTE — Telephone Encounter (Signed)
sw pt to confirm March appt dates/times per LOS

## 2017-01-15 ENCOUNTER — Other Ambulatory Visit: Payer: Self-pay | Admitting: *Deleted

## 2017-01-15 ENCOUNTER — Encounter: Payer: Self-pay | Admitting: *Deleted

## 2017-01-15 ENCOUNTER — Other Ambulatory Visit (HOSPITAL_BASED_OUTPATIENT_CLINIC_OR_DEPARTMENT_OTHER): Payer: Managed Care, Other (non HMO)

## 2017-01-15 ENCOUNTER — Other Ambulatory Visit: Payer: Managed Care, Other (non HMO)

## 2017-01-15 DIAGNOSIS — Z171 Estrogen receptor negative status [ER-]: Principal | ICD-10-CM

## 2017-01-15 DIAGNOSIS — C50411 Malignant neoplasm of upper-outer quadrant of right female breast: Secondary | ICD-10-CM

## 2017-01-15 LAB — COMPREHENSIVE METABOLIC PANEL
ALT: 24 U/L (ref 0–55)
AST: 25 U/L (ref 5–34)
Albumin: 4.4 g/dL (ref 3.5–5.0)
Alkaline Phosphatase: 102 U/L (ref 40–150)
Anion Gap: 7 mEq/L (ref 3–11)
BUN: 11.5 mg/dL (ref 7.0–26.0)
CHLORIDE: 103 meq/L (ref 98–109)
CO2: 28 meq/L (ref 22–29)
CREATININE: 0.8 mg/dL (ref 0.6–1.1)
Calcium: 10.1 mg/dL (ref 8.4–10.4)
EGFR: 87 mL/min/{1.73_m2} — ABNORMAL LOW (ref >90)
GLUCOSE: 92 mg/dL (ref 70–140)
Potassium: 4.6 mEq/L (ref 3.5–5.1)
SODIUM: 138 meq/L (ref 136–145)
Total Bilirubin: 0.36 mg/dL (ref 0.20–1.20)
Total Protein: 7.2 g/dL (ref 6.4–8.3)

## 2017-01-15 LAB — CBC WITH DIFFERENTIAL/PLATELET
BASO%: 0.7 % (ref 0.0–2.0)
BASOS ABS: 0 10*3/uL (ref 0.0–0.1)
EOS%: 0.7 % (ref 0.0–7.0)
Eosinophils Absolute: 0 10*3/uL (ref 0.0–0.5)
HCT: 40.2 % (ref 34.8–46.6)
HGB: 13.3 g/dL (ref 11.6–15.9)
LYMPH#: 1.6 10*3/uL (ref 0.9–3.3)
LYMPH%: 24.7 % (ref 14.0–49.7)
MCH: 30.5 pg (ref 25.1–34.0)
MCHC: 33.1 g/dL (ref 31.5–36.0)
MCV: 92.1 fL (ref 79.5–101.0)
MONO#: 0.6 10*3/uL (ref 0.1–0.9)
MONO%: 8.9 % (ref 0.0–14.0)
NEUT#: 4.3 10*3/uL (ref 1.5–6.5)
NEUT%: 65 % (ref 38.4–76.8)
Platelets: 294 10*3/uL (ref 145–400)
RBC: 4.36 10*6/uL (ref 3.70–5.45)
RDW: 13 % (ref 11.2–14.5)
WBC: 6.6 10*3/uL (ref 3.9–10.3)

## 2017-01-16 ENCOUNTER — Encounter: Payer: Self-pay | Admitting: *Deleted

## 2017-01-17 ENCOUNTER — Telehealth: Payer: Self-pay | Admitting: Hematology

## 2017-01-17 ENCOUNTER — Telehealth: Payer: Self-pay

## 2017-01-17 NOTE — Telephone Encounter (Signed)
Pt had many questions. Worried that her chemo is starting 8 weeks after her surgery for triple negative breast cancer, and she heard that 8 weeks after surgery is the longest to wait to start chemo.  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 She is anxiously waiting since her diagnosis was in January. We were also waiting for chemo prior auth.   Discussed premed for nausea and that she has nausea pills at the pharmacy for pickup. Noted her injection appt is before chemo and not after - inbasket sent to change this. Discussed neulasta and OnPro

## 2017-01-17 NOTE — Telephone Encounter (Signed)
I called her back, and suggested her to keep her chemo appointment on 4/4, due to her trip to [place] on 3/31. She agrees.   Truitt Merle MD

## 2017-01-17 NOTE — Telephone Encounter (Signed)
Rescheduled inj per Currie Paris. Patient is aware of new time and date.

## 2017-01-17 NOTE — Telephone Encounter (Signed)
Unable to reach patient . Left message with appt date and time. Sent reminder letter.

## 2017-01-20 NOTE — Progress Notes (Signed)
[place]  Telephone:([phone] Fax:(336) [phone]  Clinic Follow Up Note   Patient Care Team: Hayden Rasmussen, MD as PCP - General (Family Medicine)   Oncology History   Cancer Staging Malignant neoplasm of upper outer quadrant of female breast [facility]) Staging form: Breast, AJCC 8th Edition - Clinical: No stage assigned - Unsigned - Pathologic: pT1b, pN0, cM0, ER: Negative, PR: Negative, HER2: Negative - Signed by Eppie Gibson, MD on 01/08/2017       Breast cancer of upper-outer quadrant of right female breast ([place])   02/01/2016 Mammogram    Breast density category B. There are new punctate calcification at the 11:00 position of right breast, highly suspicious for malignancy. Patient refused biopsy      12/05/2016 Initial Diagnosis    Malignant neoplasm of upper outer quadrant of female breast ([place])      12/05/2016 Surgery    Right breast lumpectomy and sentinel lymph node biopsy by Dr. Ernst Bowler at [facility]       12/05/2016 Pathology Results    Right breast lumpectomy and a sentinel lymph node biopsy showed invasive ductal carcinoma, multifocal, tumor size 9 mm, 7 mm and 3 mm. Grade 3. High-grade DCIS with microcalcifications. Final margins were negative for invasive and DCIS. 2 sentinel lymph nodes were negative.      12/05/2016 Receptors her2    ER, PR and HER-2 triple negative      01/22/2017 Imaging    Bone Density  ASSESSMENT: The BMD measured at AP Spine L1-L4 is 0.766 g/cm2 with a T-score of -3.5. This patient is considered OSTEOPOROTIC according to [facility]) criteria. There has been a statistically significant decrease in BMD of left hip since exam dated 03/12/2012. Spine was not compared to prior study due to exclusion of vertebral bodies.      01/29/2017 -  Adjuvant Chemotherapy    Docetaxel and Cytoxan 4 cycles every 3 weeks starting 01/29/17. Neulasta given 2 days after chemo.       CHIEF COMPLAINTS:  Follow up right triple  negative breast cancer   HISTORY OF PRESENTING ILLNESS:  Paula [name] 61 y.o. female is here because of recently diagnosed right multifocal ductal carcinoma, triple negative.  She is s/p right lumpectomy and node biopsy on 12/05/2016. She was referred by her radiation oncologist Dr. Isidore Moos. She presents to my clinic by herself today.  The patient had a routine mammogram on 01/2016. The findings demonstrated a group of indeterminate microcalcification over the upper outer right breast for which  A biopsy was recommended, which was declined at the time. The patient opted to pursue a thermogram instead that was reported as normal. A few months after, while walking her dog, and felt pain in her arm. A repeat thermogram was done and showed an abnormality on the opposite side. She then decided to repeat a mammogram.   Repeat mammogram on 10/25/16 showed an irregular 7 mm mass over the posterior third of the upper right breast with few associated microcalcifications, as this mass lies along the posterior edge of the previously described segmental microclacifcations. Targeted ultrasound showed bilobed hypoechoic mass with mild border irregularly over the 10:30 position of the right breast 9 cm from the nipple corresponding to the mass with the border irregularity.  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  Right lumpectomy and sentinel node dissection on 12/05/16 with Dr. Ernst Bowler  revealed multifocal breast cancer - pT1b, pN0.  The patient presents today to discuss the roll that chemotherapy may have to help with her disease. Patient does not live in [place], but in [place] She reports her sisters have experienced the same thing, which has resulted in [place], Stage 0. She is here to get another opinion, due to her cancer being triple negative. She denies any  pain, swelling and has full ROM. Patient does yoga and walk to help. Also denies fever. Patient does not smoke. She does not want to take calcium because she thinks it may have cause colon polyps.   Patient reports history of parasites which was found 20 years ago and tests are still positive. She also has history of EBV virus infection.  Patient is strongly believe homeopathic and alternative medicine, she takes multiple vitamin and herbal supplements.  CURRENT THERAPY: Docetaxel and Cytoxan 4 cycles every 3 weeks and Neulasta on day 3, started 01/29/17.  INTERVAL HISTORY: Paula [name] returns for a follow up and first cycle of Docetaxel and Cytoxan. She presents with her husband. The patient states she is doing well and has no complaints at this time. She is worried about the results of her bone scan.  MEDICAL HISTORY:  Past Medical History:  Diagnosis Date  . Candida infection    in bowels  . 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 [name] infection   . Hypercholesteremia   . Hypothyroid   . Osteoporosis   . Skin cancer    right side of face basal cell    SURGICAL HISTORY: Past Surgical History:  Procedure Laterality Date  . basal cell skin cancer     to right side of face two times  . BREAST SURGERY     right breast biopsy  . TONSILLECTOMY     as a child    SOCIAL HISTORY: Social History   Social History  . Marital status: Married    Spouse name: N/A  . Number of children: N/A  . Years of education: N/A   Occupational History  . Not on file.   Social History Main Topics  . Smoking status: Former Smoker    Packs/day: 0.25    Years: 2.00    Quit date: 03/30/1976  . Smokeless tobacco: Never Used     Comment: she smoked socially as a teenager  . Alcohol use Yes     Comment: rare wine use  . Drug use: No  . Sexual activity: Yes    Birth control/ protection: Post-menopausal   Other Topics Concern  . Not on file   Social History  Narrative  . No narrative on file    FAMILY HISTORY: Family History  Problem Relation Age of Onset  . Cancer Mother 22    breast  . Cancer Father     pancreatic  . Cancer Sister 53    breast  . Heart disease Maternal Grandfather   . Cancer Paternal Grandmother 83    lymphoma  . Cancer Paternal Grandfather 7    stomach  . Cancer Maternal Grandmother 95    breast cancer   . Cancer Sister 47    breast cancer   2 children, not biological  ALLERGIES:  has No Known Allergies.  MEDICATIONS:  Current Outpatient Prescriptions  Medication Sig Dispense Refill  . ARMOUR THYROID 30 MG tablet 1 1/2 TABLET EVERY DAY  3  . Ascorbic Acid (VITAMIN C) 100 MG tablet Take  100 mg by mouth 2 (two) times daily.    . cholecalciferol (VITAMIN D) 1000 UNITS tablet Take 1,000 Units by mouth daily.    Marland Kitchen co-enzyme Q-10 50 MG capsule Take 50 mg by mouth.    . Digestive Enzymes (BETAINE HCL) 650-130 MG CAPS Take 1 capsule by mouth 3 (three) times daily with meals. Betaine HCL [place]    . Garlic 10 MG CAPS Take by mouth. Dosing unknown    . GLUTAMINE PO Take by mouth. For leaky gut    . Homeopathic Products (FRANKINCENSE UPLIFTING) OIL Inhale into the lungs.    . IODINE, KELP, PO Take by mouth. ? Dose 84m?    .Marland KitchenMAGNESIUM CITRATE PO Take 200 mg by mouth 2 (two) times daily.    . Melatonin 3-2 MG TABS Take by mouth every evening. Actual dose is 3-62m   . niacin 50 MG tablet Take 50 mg by mouth at bedtime.    . OIL OF [place]O PO Take by mouth 3 (three) times daily with meals.    . ondansetron (ZOFRAN) 8 MG tablet Take 1 tablet (8 mg total) by mouth 2 (two) times daily as needed for refractory nausea / vomiting. Start on day 3 after chemo. 30 tablet 1  . OVER THE COUNTER MEDICATION 1 tablet 3 (three) times daily with meals. Digestive enzmyes    . Selenium (V-R SELENIUM) 200 MCG TABS Take by mouth.    . TURMERIC PO Take 250 mg by mouth daily.    . Marland KitchenNABLE TO FIND 1 Units by Other  route once. Cranial Prosthesis - hair loss due to chemotherapy. 1 Units 0  . vitamin E (VITAMIN E) 200 UNIT capsule Take 200 Units by mouth 2 (two) times daily.    . Marland KitchenITAMIN K PO Take 2 mg by mouth daily.    . Marland KitchenINC ASPARTATE PO Take 15 mg by mouth daily. For leaky gut    . prochlorperazine (COMPAZINE) 10 MG tablet Take 1 tablet (10 mg total) by mouth every 6 (six) hours as needed (Nausea or vomiting). 30 tablet 1   No current facility-administered medications for this visit.     REVIEW OF SYSTEMS:   Constitutional: Denies fevers, chills or abnormal night sweats Eyes: Denies blurriness of vision, double vision or watery eyes Ears, nose, mouth, throat, and face: Denies mucositis or sore throat Respiratory: Denies cough, dyspnea or wheezes Cardiovascular: Denies palpitation, chest discomfort or lower extremity swelling Gastrointestinal:  Denies nausea, heartburn or change in bowel habits Skin: Denies abnormal skin rashes Lymphatics: Denies new lymphadenopathy or easy bruising Neurological:Denies numbness, tingling or new weaknesses Behavioral/Psych: Mood is stable, no new changes  All other systems were reviewed with the patient and are negative.  PHYSICAL EXAMINATION:  ECOG PERFORMANCE STATUS: 0 - Asymptomatic  Vitals:   01/29/17 1335  BP: 114/67  Pulse: 76  Resp: 17  Temp: 98.2 F (36.8 C)   Filed Weights   01/29/17 1335  Weight: 103 lb 1.6 oz (46.8 kg)    GENERAL:alert, no distress and comfortable SKIN: skin color, texture, turgor are normal, no rashes or significant lesions EYES: normal, conjunctiva are pink and non-injected, sclera clear OROPHARYNX:no exudate, no erythema and lips, buccal mucosa, and tongue normal  NECK: supple, thyroid normal size, non-tender, without nodularity LYMPH:  no palpable lymphadenopathy in the cervical, axillary or inguinal LUNGS: clear to auscultation and percussion with normal breathing effort HEART: regular rate & rhythm and no murmurs  and no lower  extremity edema ABDOMEN:abdomen soft, non-tender and normal bowel sounds Musculoskeletal:no cyanosis of digits and no clubbing  PSYCH: alert & oriented x 3 with fluent speech NEURO: no focal motor/sensory deficits BREAST: the incision in right breast above areola well healed, No palpable mass or adenopathy.  LABORATORY DATA:  I have reviewed the data as listed CBC Latest Ref Rng & Units 01/29/2017 01/15/2017 03/30/2014  WBC 3.9 - 10.3 10e3/uL 6.7 6.6 7.4  Hemoglobin 11.6 - 15.9 g/dL 12.8 13.3 13.6  Hematocrit 34.8 - 46.6 % 37.6 40.2 40.9  Platelets 145 - 400 10e3/uL 284 294 280   CMP Latest Ref Rng & Units 01/29/2017 01/15/2017 03/30/2014  Glucose 70 - 140 mg/dl 118 92 82  BUN 7.0 - 26.0 mg/dL 17.2 11.5 24(H)  Creatinine 0.6 - 1.1 mg/dL 0.8 0.8 0.67  Sodium 136 - 145 mEq/L 136 138 139  Potassium 3.5 - 5.1 mEq/L 4.3 4.6 4.5  Chloride 96 - 112 mEq/L - - 103  CO2 22 - 29 mEq/L _0 Calcium 8.4 - 10.4 mg/dL 9.9 10.1 10.2  Total Protein 6.4 - 8.3 g/dL 6.7 7.2 7.5  Total Bilirubin 0.20 - 1.20 mg/dL 0.23 0.36 0.4  Alkaline Phos 40 - 150 U/L 99 102 90  AST 5 - 34 U/L _1 ALT 0 - 55 U/L _2 PATHOLOGY REPORT  See onc history   RADIOGRAPHIC STUDIES: I have personally reviewed the radiological images as listed and agreed with the findings in the report. Dg Bone Density  Result Date: 01/22/2017 EXAM: DUAL X-RAY ABSORPTIOMETRY (DXA) FOR BONE MINERAL DENSITY IMPRESSION: Referring Physician:  Truitt Merle PATIENT: Name: Paula, [name] Patient ID: [id number] Birth Date: October 14, 1956 Height: 64.0 in. Sex: Female Measured: 01/22/2017 Weight: 102.9 lbs. Indications: Caucasian, Estrogen Deficient, History of Osteoporosis, Low Body Weight (783.22), Low Calcium Intake (269.3), Postmenopausal Fractures: None Treatments: Vitamin D (E933.5) ASSESSMENT: The BMD measured at AP Spine L1-L4 is 0.766 g/cm2 with a T-score of -3.5. This patient is considered OSTEOPOROTIC according to [facility]) criteria. There has been a statistically significant decrease in BMD of left hip since exam dated 03/12/2012. Spine was not compared to prior study due to exclusion of vertebral bodies. Site Region Measured Date Measured Age YA T-score BMD Significant CHANGE AP Spine  L1-L4      2/22/2017    59.7         -3.6    0.766 g/cm2 DualFemur Total Left 01/22/2017 59.7 -2.9 0.647 g/cm2 * World Health Organization [facility]) criteria for post-menopausal, Caucasian Women: Normal       T-score at or above -1 SD Osteopenia   T-score between -1 and -2.5 SD Osteoporosis T-score at or below -2.5 SD RECOMMENDATION: [place] recommends that FDA-approved medical therapies be considered in postmenopausal women and men age 66 or older with a: 1. Hip or vertebral (clinical or morphometric) fracture. 2. T-score of <-2.5 at the spine or hip. 3. Ten-year fracture probability by FRAX of 3% or greater for hip fracture or 20% or greater for major osteoporotic fracture. All treatment decisions require clinical judgment and consideration of individual patient factors, including patient preferences, co-morbidities, previous drug use, risk factors not captured in the FRAX model (e.g. falls, vitamin D deficiency, increased bone turnover, interval significant decline in bone density) and possible under - or over-estimation of fracture risk by FRAX. All patients should ensure an adequate intake of dietary calcium (1200 mg/d) and vitamin D (800 IU  daily) unless contraindicated. FOLLOW-UP: People with diagnosed cases of osteoporosis or at high risk for fracture should have regular bone mineral density tests. For patients eligible for Medicare, routine testing is allowed once every 2 years. The testing frequency can be increased to one year for patients who have rapidly progressing disease, those who are receiving or discontinuing medical therapy to restore bone mass, or have additional risk factors. I have  reviewed this report, and agree with the above findings. Mark A. Thornton Papas, M.D. [facility] Electronically Signed   By: Lavonia Dana M.D.   On: 01/22/2017 15:58    ASSESSMENT & PLAN: 61 y.o. Caucasian female, postmenopausal  1. 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 -She had early stage breast cancer, had a complete surgical resection. -We discussed the risk of cancer recurrence after surgical resection. We reviewed the natural history of triple negative disease, which is more aggressive. She probably has moderate to high risk of recurrence, estimated distant recurrence risk about 20-30%.  -Given her multifocal disease, with the largest 0.9 cm, and the triple negative disease, I do recommend adjuvant chemotherapy. We discussed various option of chemotherapy regiment, including standard AC-T, or TC 4-6 cycles, or single agent with Taxol or Taxotere. Due to her stage IA disease, I think the benefit of TC (docetaxel and Cytoxan) 4-6 cycles is probably similar to Adriamycin-based chemotherapy regimen. Patient is very concerned about side effects from chemotherapy, I think TCx4 is probably the best fit for her. - Patient has visited multiple oncologist to get opinion of taking chemotherapy, but was not determined if she wants to take chemo or not -After lengthy discussion and multiple phone calls after her last visit, patient finally agreed to take chemo TC, starting today  -Patient takes multiple vitamins and herbs supplements, I may ask our pharmacist to check the interaction with chemotherapy, if she decides to do chemotherapy.  -I advised the patient not to take herbal supplements 3 days prior and 3 days after chemotherapy. -Labs reviewed, adequate for treatment. First cycle TC today and every  3 weeks. Neulasta injection 2 days after chemo. -Patient is extremely concerned about the side effects of osteoporosis from steroids, and she declined any steroids before or after chemotherapy, she is only agreeable to take steroids if she has severe allergy reaction.  2. Genetic Testing A. Mother and two sisters with breast cancer, all tested negative. Sister with VUS in [place]. B. 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 - Father had pancreatic cancer   3. 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 She is being treated by Dr. Julien Nordmann at [facility] in [place].  -I offered her a referral to see infection disease specialist if she decides to take chemotherapy. She deferred.  4. Osteoporosis -Bone scan on 01/22/17 revealed worsening osteoporosis. -I advised the patient to take Calcium and Vitamin D supplementation. -The patient does not want to take steroids for her treatment since steroids could weaken her bone. -I discussed that she might develop reactions during chemotherapy; such as a drop in BP, skin rash, itchy skin. -The patient still insists not have steroids with her IV chemotherapy infusion to reduce the risk of reaction. If she does develop a reaction, she is to take oral Benadryl TID and I will give steroids the following infusion.  Plan:   -  First cycle TC today, every 3 weeks. Neulasta on day 3  -No steroids given with chemo today. -Lab and  f/u in 1 week for toxicity check up  No orders of the defined types were placed in this encounter.   All questions were answered. The patient knows to call the clinic with any problems, questions or concerns.  I spent 20 minutes counseling the patient face to face. The total time spent in the appointment was 30 minutes and more than 50% was on counseling.     Truitt Merle, MD 01/29/2017   This document serves as a record of services personally performed by Truitt Merle,  MD. It was created on her behalf by Darcus Austin, a trained medical scribe. The creation of this record is based on the scribe's personal observations and the provider's statements to them. This document has been checked and approved by the attending provider.

## 2017-01-21 ENCOUNTER — Other Ambulatory Visit: Payer: Managed Care, Other (non HMO)

## 2017-01-21 ENCOUNTER — Ambulatory Visit: Payer: Managed Care, Other (non HMO)

## 2017-01-21 ENCOUNTER — Ambulatory Visit: Payer: Managed Care, Other (non HMO) | Admitting: Hematology

## 2017-01-22 ENCOUNTER — Encounter: Payer: Self-pay | Admitting: *Deleted

## 2017-01-22 ENCOUNTER — Ambulatory Visit
Admission: RE | Admit: 2017-01-22 | Discharge: 2017-01-22 | Disposition: A | Discharge status: Home / Self Care | Payer: Managed Care, Other (non HMO) | Source: Ambulatory Visit | Attending: Hematology | Admitting: Hematology

## 2017-01-22 DIAGNOSIS — M8580 Other specified disorders of bone density and structure, unspecified site: Secondary | ICD-10-CM

## 2017-01-22 NOTE — Progress Notes (Signed)
[place] Psychosocial Distress Screening Clinical Social Work  Clinical Social Work was referred by distress screening protocol.  The patient scored a 7 on the Psychosocial Distress Thermometer which indicates severe distress. Clinical Social Worker reviewed chart and phoned pt to assess for distress and other psychosocial needs. CSW left brief message for pt and introduced self, explained role of CSW/Pt and Family Support Team, support groups and other resources to assist. CSW encouraged pt to return CSW call.     ONCBCN DISTRESS SCREENING 01/07/2017  Screening Type Initial Screening  Distress experienced in past week (1-10) 7  Family Problem type Partner;Children  Emotional problem type Nervousness/Anxiety  Information Concerns Type Lack of info about complementary therapy choices  Physical Problem type Sleep/insomnia    Clinical Social Worker follow up needed: No.  If yes, follow up plan:  Loren Racer, Marlinda Mike, OSW-C Clinical Social Worker [place]  [facility] Phone: [phone] Fax: [phone]

## 2017-01-23 ENCOUNTER — Ambulatory Visit: Payer: Managed Care, Other (non HMO)

## 2017-01-24 ENCOUNTER — Other Ambulatory Visit: Payer: Self-pay | Admitting: *Deleted

## 2017-01-27 ENCOUNTER — Encounter: Payer: Self-pay | Admitting: Hematology

## 2017-01-27 ENCOUNTER — Encounter: Payer: Self-pay | Admitting: *Deleted

## 2017-01-28 ENCOUNTER — Other Ambulatory Visit: Payer: Self-pay | Admitting: *Deleted

## 2017-01-28 DIAGNOSIS — C50411 Malignant neoplasm of upper-outer quadrant of right female breast: Secondary | ICD-10-CM

## 2017-01-29 ENCOUNTER — Other Ambulatory Visit (HOSPITAL_BASED_OUTPATIENT_CLINIC_OR_DEPARTMENT_OTHER): Payer: Managed Care, Other (non HMO)

## 2017-01-29 ENCOUNTER — Ambulatory Visit (HOSPITAL_BASED_OUTPATIENT_CLINIC_OR_DEPARTMENT_OTHER): Payer: Managed Care, Other (non HMO) | Admitting: Hematology

## 2017-01-29 ENCOUNTER — Ambulatory Visit (HOSPITAL_BASED_OUTPATIENT_CLINIC_OR_DEPARTMENT_OTHER): Payer: Managed Care, Other (non HMO)

## 2017-01-29 ENCOUNTER — Other Ambulatory Visit: Payer: Self-pay | Admitting: *Deleted

## 2017-01-29 VITALS — BP 114/67 | HR 76 | Temp 98.2°F | Resp 17 | Ht 64.0 in | Wt 103.1 lb

## 2017-01-29 VITALS — BP 122/86 | HR 67 | Temp 97.8°F | Resp 18

## 2017-01-29 DIAGNOSIS — A071 Giardiasis [lambliasis]: Secondary | ICD-10-CM | POA: Diagnosis not present

## 2017-01-29 DIAGNOSIS — M810 Age-related osteoporosis without current pathological fracture: Secondary | ICD-10-CM | POA: Diagnosis not present

## 2017-01-29 DIAGNOSIS — C50411 Malignant neoplasm of upper-outer quadrant of right female breast: Secondary | ICD-10-CM

## 2017-01-29 DIAGNOSIS — Z171 Estrogen receptor negative status [ER-]: Principal | ICD-10-CM

## 2017-01-29 DIAGNOSIS — B719 Cestode infection, unspecified: Secondary | ICD-10-CM

## 2017-01-29 DIAGNOSIS — Z5111 Encounter for antineoplastic chemotherapy: Secondary | ICD-10-CM | POA: Diagnosis not present

## 2017-01-29 DIAGNOSIS — C50412 Malignant neoplasm of upper-outer quadrant of left female breast: Secondary | ICD-10-CM

## 2017-01-29 LAB — CBC WITH DIFFERENTIAL/PLATELET
BASO%: 0.7 % (ref 0.0–2.0)
Basophils Absolute: 0 10*3/uL (ref 0.0–0.1)
EOS%: 0.9 % (ref 0.0–7.0)
Eosinophils Absolute: 0.1 10*3/uL (ref 0.0–0.5)
HCT: 37.6 % (ref 34.8–46.6)
HGB: 12.8 g/dL (ref 11.6–15.9)
LYMPH%: 25.7 % (ref 14.0–49.7)
MCH: 30.5 pg (ref 25.1–34.0)
MCHC: 34 g/dL (ref 31.5–36.0)
MCV: 90 fL (ref 79.5–101.0)
MONO#: 0.5 10*3/uL (ref 0.1–0.9)
MONO%: 7 % (ref 0.0–14.0)
NEUT%: 65.7 % (ref 38.4–76.8)
NEUTROS ABS: 4.4 10*3/uL (ref 1.5–6.5)
Platelets: 284 10*3/uL (ref 145–400)
RBC: 4.17 10*6/uL (ref 3.70–5.45)
RDW: 12.6 % (ref 11.2–14.5)
WBC: 6.7 10*3/uL (ref 3.9–10.3)
lymph#: 1.7 10*3/uL (ref 0.9–3.3)

## 2017-01-29 LAB — COMPREHENSIVE METABOLIC PANEL
ALT: 19 U/L (ref 0–55)
AST: 21 U/L (ref 5–34)
Albumin: 4.1 g/dL (ref 3.5–5.0)
Alkaline Phosphatase: 99 U/L (ref 40–150)
Anion Gap: 9 mEq/L (ref 3–11)
BUN: 17.2 mg/dL (ref 7.0–26.0)
CHLORIDE: 103 meq/L (ref 98–109)
CO2: 24 meq/L (ref 22–29)
CREATININE: 0.8 mg/dL (ref 0.6–1.1)
Calcium: 9.9 mg/dL (ref 8.4–10.4)
EGFR: 82 mL/min/{1.73_m2} — ABNORMAL LOW (ref >90)
GLUCOSE: 118 mg/dL (ref 70–140)
Potassium: 4.3 mEq/L (ref 3.5–5.1)
SODIUM: 136 meq/L (ref 136–145)
Total Bilirubin: 0.23 mg/dL (ref 0.20–1.20)
Total Protein: 6.7 g/dL (ref 6.4–8.3)

## 2017-01-29 MED ORDER — SODIUM CHLORIDE 0.9 % IV SOLN
600.0000 mg/m2 | Freq: Once | INTRAVENOUS | Status: AC
  Administered 2017-01-29: 880 mg via INTRAVENOUS
  Filled 2017-01-29: qty 44

## 2017-01-29 MED ORDER — PALONOSETRON HCL INJECTION 0.25 MG/5ML
INTRAVENOUS | Status: AC
  Filled 2017-01-29: qty 5

## 2017-01-29 MED ORDER — SODIUM CHLORIDE 0.9 % IV SOLN
Freq: Once | INTRAVENOUS | Status: AC
  Administered 2017-01-29: 15:00:00 via INTRAVENOUS

## 2017-01-29 MED ORDER — DEXTROSE 5 % IV SOLN
75.0000 mg/m2 | Freq: Once | INTRAVENOUS | Status: AC
  Administered 2017-01-29: 110 mg via INTRAVENOUS
  Filled 2017-01-29: qty 11

## 2017-01-29 MED ORDER — UNABLE TO FIND: 1.0000 [IU] | Freq: Once | 0 refills | Status: AC

## 2017-01-29 MED ORDER — PALONOSETRON HCL INJECTION 0.25 MG/5ML
0.2500 mg | Freq: Once | INTRAVENOUS | Status: AC
  Administered 2017-01-29: 0.25 mg via INTRAVENOUS

## 2017-01-29 NOTE — Patient Instructions (Addendum)
[facility] Discharge Instructions for Patients Receiving Chemotherapy  Today you received the following chemotherapy agents:  Taxotere, Carboplatin  To help prevent nausea and vomiting after your treatment, we encourage you to take your nausea medication as prescribed.   If you develop nausea and vomiting that is not controlled by your nausea medication, call the clinic.   BELOW ARE SYMPTOMS THAT SHOULD BE REPORTED IMMEDIATELY:  *FEVER GREATER THAN 100.5 F  *CHILLS WITH OR WITHOUT FEVER  NAUSEA AND VOMITING THAT IS NOT CONTROLLED WITH YOUR NAUSEA MEDICATION  *UNUSUAL SHORTNESS OF BREATH  *UNUSUAL BRUISING OR BLEEDING  TENDERNESS IN MOUTH AND THROAT WITH OR WITHOUT PRESENCE OF ULCERS  *URINARY PROBLEMS  *BOWEL PROBLEMS  UNUSUAL RASH Items with * indicate a potential emergency and should be followed up as soon as possible.  Feel free to call the clinic you have any questions or concerns. The clinic phone number is [phone].  Please show the [facility] at check-in to the Emergency Department and triage nurse.    Cyclophosphamide injection What is this medicine? CYCLOPHOSPHAMIDE (sye kloe FOSS fa mide) is a chemotherapy drug. It slows the growth of cancer cells. This medicine is used to treat many types of cancer like lymphoma, myeloma, leukemia, breast cancer, and ovarian cancer, to name a few. This medicine may be used for other purposes; ask your health care provider or pharmacist if you have questions. COMMON BRAND NAME(S): Cytoxan, Neosar What should I tell my health care provider before I take this medicine? They need to know if you have any of these conditions: -blood disorders -history of other chemotherapy -infection -kidney disease -liver disease -recent or ongoing radiation therapy -tumors in the bone marrow -an unusual or allergic reaction to cyclophosphamide, other chemotherapy, other medicines, foods, dyes, or  preservatives -pregnant or trying to get pregnant -breast-feeding How should I use this medicine? This drug is usually given as an injection into a vein or muscle or by infusion into a vein. It is administered in a hospital or clinic by a specially trained health care professional. Talk to your pediatrician regarding the use of this medicine in children. Special care may be needed. Overdosage: If you think you have taken too much of this medicine contact a poison control center or emergency room at once. NOTE: This medicine is only for you. Do not share this medicine with others. What if I miss a dose? It is important not to miss your dose. Call your doctor or health care professional if you are unable to keep an appointment. What may interact with this medicine? This medicine may interact with the following medications: -amiodarone -amphotericin B -azathioprine -certain antiviral medicines for HIV or AIDS such as protease inhibitors (e.g., indinavir, ritonavir) and zidovudine -certain blood pressure medications such as benazepril, captopril, enalapril, fosinopril, lisinopril, moexipril, monopril, perindopril, quinapril, ramipril, trandolapril -certain cancer medications such as anthracyclines (e.g., daunorubicin, doxorubicin), busulfan, cytarabine, paclitaxel, pentostatin, tamoxifen, trastuzumab -certain diuretics such as chlorothiazide, chlorthalidone, hydrochlorothiazide, indapamide, metolazone -certain medicines that treat or prevent blood clots like warfarin -certain muscle relaxants such as succinylcholine -cyclosporine -etanercept -indomethacin -medicines to increase blood counts like filgrastim, pegfilgrastim, sargramostim -medicines used as general anesthesia -metronidazole -natalizumab This list may not describe all possible interactions. Give your health care provider a list of all the medicines, herbs, non-prescription drugs, or dietary supplements you use. Also tell them if  you smoke, drink alcohol, or use illegal drugs. Some items may interact with your medicine. What should I  watch for while using this medicine? Visit your doctor for checks on your progress. This drug may make you feel generally unwell. This is not uncommon, as chemotherapy can affect healthy cells as well as cancer cells. Report any side effects. Continue your course of treatment even though you feel ill unless your doctor tells you to stop. Drink water or other fluids as directed. Urinate often, even at night. In some cases, you may be given additional medicines to help with side effects. Follow all directions for their use. Call your doctor or health care professional for advice if you get a fever, chills or sore throat, or other symptoms of a cold or flu. Do not treat yourself. This drug decreases your body's ability to fight infections. Try to avoid being around people who are sick. This medicine may increase your risk to bruise or bleed. Call your doctor or health care professional if you notice any unusual bleeding. Be careful brushing and flossing your teeth or using a toothpick because you may get an infection or bleed more easily. If you have any dental work done, tell your dentist you are receiving this medicine. You may get drowsy or dizzy. Do not drive, use machinery, or do anything that needs mental alertness until you know how this medicine affects you. Do not become pregnant while taking this medicine or for 1 year after stopping it. Women should inform their doctor if they wish to become pregnant or think they might be pregnant. Men should not father a child while taking this medicine and for 4 months after stopping it. There is a potential for serious side effects to an unborn child. Talk to your health care professional or pharmacist for more information. Do not breast-feed an infant while taking this medicine. This medicine may interfere with the ability to have a child. This medicine  has caused ovarian failure in some women. This medicine has caused reduced sperm counts in some men. You should talk with your doctor or health care professional if you are concerned about your fertility. If you are going to have surgery, tell your doctor or health care professional that you have taken this medicine. What side effects may I notice from receiving this medicine? Side effects that you should report to your doctor or health care professional as soon as possible: -allergic reactions like skin rash, itching or hives, swelling of the face, lips, or tongue -low blood counts - this medicine may decrease the number of white blood cells, red blood cells and platelets. You may be at increased risk for infections and bleeding. -signs of infection - fever or chills, cough, sore throat, pain or difficulty passing urine -signs of decreased platelets or bleeding - bruising, pinpoint red spots on the skin, black, tarry stools, blood in the urine -signs of decreased red blood cells - unusually weak or tired, fainting spells, lightheadedness -breathing problems -dark urine -dizziness -palpitations -swelling of the ankles, feet, hands -trouble passing urine or change in the amount of urine -weight gain -yellowing of the eyes or skin Side effects that usually do not require medical attention (report to your doctor or health care professional if they continue or are bothersome): -changes in nail or skin color -hair loss -missed menstrual periods -mouth sores -nausea, vomiting This list may not describe all possible side effects. Call your doctor for medical advice about side effects. You may report side effects to FDA at 1-800-FDA-1088. Where should I keep my medicine? This drug is given in a  hospital or clinic and will not be stored at home. NOTE: This sheet is a summary. It may not cover all possible information. If you have questions about this medicine, talk to your doctor, pharmacist, or  health care provider.  2018 Elsevier/Gold Standard (2012-08-28 16:22:58)      Docetaxel injection What is this medicine? DOCETAXEL (doe se TAX el) is a chemotherapy drug. It targets fast dividing cells, like cancer cells, and causes these cells to die. This medicine is used to treat many types of cancers like breast cancer, certain stomach cancers, head and neck cancer, lung cancer, and prostate cancer. This medicine may be used for other purposes; ask your health care provider or pharmacist if you have questions. COMMON BRAND NAME(S): Docefrez, Taxotere What should I tell my health care provider before I take this medicine? They need to know if you have any of these conditions: -infection (especially a virus infection such as chickenpox, cold sores, or herpes) -liver disease -low blood counts, like low white cell, platelet, or red cell counts -an unusual or allergic reaction to docetaxel, polysorbate 80, other chemotherapy agents, other medicines, foods, dyes, or preservatives -pregnant or trying to get pregnant -breast-feeding How should I use this medicine? This drug is given as an infusion into a vein. It is administered in a hospital or clinic by a specially trained health care professional. Talk to your pediatrician regarding the use of this medicine in children. Special care may be needed. Overdosage: If you think you have taken too much of this medicine contact a poison control center or emergency room at once. NOTE: This medicine is only for you. Do not share this medicine with others. What if I miss a dose? It is important not to miss your dose. Call your doctor or health care professional if you are unable to keep an appointment. What may interact with this medicine? -cyclosporine -erythromycin -ketoconazole -medicines to increase blood counts like filgrastim, pegfilgrastim, sargramostim -vaccines Talk to your doctor or health care professional before taking any of these  medicines: -acetaminophen -aspirin -ibuprofen -ketoprofen -naproxen This list may not describe all possible interactions. Give your health care provider a list of all the medicines, herbs, non-prescription drugs, or dietary supplements you use. Also tell them if you smoke, drink alcohol, or use illegal drugs. Some items may interact with your medicine. What should I watch for while using this medicine? Your condition will be monitored carefully while you are receiving this medicine. You will need important blood work done while you are taking this medicine. This drug may make you feel generally unwell. This is not uncommon, as chemotherapy can affect healthy cells as well as cancer cells. Report any side effects. Continue your course of treatment even though you feel ill unless your doctor tells you to stop. In some cases, you may be given additional medicines to help with side effects. Follow all directions for their use. Call your doctor or health care professional for advice if you get a fever, chills or sore throat, or other symptoms of a cold or flu. Do not treat yourself. This drug decreases your body's ability to fight infections. Try to avoid being around people who are sick. This medicine may increase your risk to bruise or bleed. Call your doctor or health care professional if you notice any unusual bleeding. This medicine may contain alcohol in the product. You may get drowsy or dizzy. Do not drive, use machinery, or do anything that needs mental alertness until you know  how this medicine affects you. Do not stand or sit up quickly, especially if you are an older patient. This reduces the risk of dizzy or fainting spells. Avoid alcoholic drinks. Do not become pregnant while taking this medicine. Women should inform their doctor if they wish to become pregnant or think they might be pregnant. There is a potential for serious side effects to an unborn child. Talk to your health care  professional or pharmacist for more information. Do not breast-feed an infant while taking this medicine. What side effects may I notice from receiving this medicine? Side effects that you should report to your doctor or health care professional as soon as possible: -allergic reactions like skin rash, itching or hives, swelling of the face, lips, or tongue -low blood counts - This drug may decrease the number of white blood cells, red blood cells and platelets. You may be at increased risk for infections and bleeding. -signs of infection - fever or chills, cough, sore throat, pain or difficulty passing urine -signs of decreased platelets or bleeding - bruising, pinpoint red spots on the skin, black, tarry stools, nosebleeds -signs of decreased red blood cells - unusually weak or tired, fainting spells, lightheadedness -breathing problems -fast or irregular heartbeat -low blood pressure -mouth sores -nausea and vomiting -pain, swelling, redness or irritation at the injection site -pain, tingling, numbness in the hands or feet -swelling of the ankle, feet, hands -weight gain Side effects that usually do not require medical attention (report to your doctor or health care professional if they continue or are bothersome): -bone pain -complete hair loss including hair on your head, underarms, pubic hair, eyebrows, and eyelashes -diarrhea -excessive tearing -changes in the color of fingernails -loosening of the fingernails -nausea -muscle pain -red flush to skin -sweating -weak or tired This list may not describe all possible side effects. Call your doctor for medical advice about side effects. You may report side effects to FDA at 1-800-FDA-1088. Where should I keep my medicine? This drug is given in a hospital or clinic and will not be stored at home. NOTE: This sheet is a summary. It may not cover all possible information. If you have questions about this medicine, talk to your doctor,  pharmacist, or health care provider.  2018 Elsevier/Gold Standard (2015-11-16 12:32:56)

## 2017-01-30 ENCOUNTER — Telehealth: Payer: Self-pay | Admitting: *Deleted

## 2017-01-30 NOTE — Telephone Encounter (Signed)
  Oncology Nurse Navigator Documentation  Navigator Location: [facility] (01/30/17 1200)   )Navigator Encounter Type: Treatment (01/30/17 1200) Telephone: [facility] (01/30/17 1200)    left vm to assess needs after 1st chemo. Contact information provided.               Patient Visit Type: MedOnc (01/30/17 1200) Treatment Phase: First Chemo Tx (01/29/17) (01/30/17 1200)                            Time Spent with Patient: 15 (01/30/17 1200)

## 2017-01-31 ENCOUNTER — Telehealth: Payer: Self-pay | Admitting: Hematology

## 2017-01-31 ENCOUNTER — Ambulatory Visit (HOSPITAL_BASED_OUTPATIENT_CLINIC_OR_DEPARTMENT_OTHER): Payer: Managed Care, Other (non HMO)

## 2017-01-31 VITALS — BP 99/61 | HR 76 | Temp 98.6°F | Resp 18

## 2017-01-31 DIAGNOSIS — C50411 Malignant neoplasm of upper-outer quadrant of right female breast: Secondary | ICD-10-CM | POA: Diagnosis not present

## 2017-01-31 DIAGNOSIS — Z5189 Encounter for other specified aftercare: Secondary | ICD-10-CM | POA: Diagnosis not present

## 2017-01-31 DIAGNOSIS — Z171 Estrogen receptor negative status [ER-]: Principal | ICD-10-CM

## 2017-01-31 MED ORDER — PEGFILGRASTIM INJECTION 6 MG/0.6ML ~~LOC~~
6.0000 mg | PREFILLED_SYRINGE | Freq: Once | SUBCUTANEOUS | Status: AC
  Administered 2017-01-31: 6 mg via SUBCUTANEOUS
  Filled 2017-01-31: qty 0.6

## 2017-01-31 NOTE — Telephone Encounter (Signed)
Gave patient calender - patient came in after injection for scheduled per 01/29/2017 los.

## 2017-01-31 NOTE — Patient Instructions (Signed)
Pegfilgrastim injection What is this medicine? PEGFILGRASTIM (PEG fil gra stim) is a long-acting granulocyte colony-stimulating factor that stimulates the growth of neutrophils, a type of white blood cell important in the body's fight against infection. It is used to reduce the incidence of fever and infection in patients with certain types of cancer who are receiving chemotherapy that affects the bone marrow, and to increase survival after being exposed to high doses of radiation. This medicine may be used for other purposes; ask your health care provider or pharmacist if you have questions. COMMON BRAND NAME(S): Neulasta What should I tell my health care provider before I take this medicine? They need to know if you have any of these conditions: -kidney disease -latex allergy -ongoing radiation therapy -sickle cell disease -skin reactions to acrylic adhesives (On-Body Injector only) -an unusual or allergic reaction to pegfilgrastim, filgrastim, other medicines, foods, dyes, or preservatives -pregnant or trying to get pregnant -breast-feeding How should I use this medicine? This medicine is for injection under the skin. If you get this medicine at home, you will be taught how to prepare and give the pre-filled syringe or how to use the On-body Injector. Refer to the patient Instructions for Use for detailed instructions. Use exactly as directed. Tell your healthcare provider immediately if you suspect that the On-body Injector may not have performed as intended or if you suspect the use of the On-body Injector resulted in a missed or partial dose. It is important that you put your used needles and syringes in a special sharps container. Do not put them in a trash can. If you do not have a sharps container, call your pharmacist or healthcare provider to get one. Talk to your pediatrician regarding the use of this medicine in children. While this drug may be prescribed for selected conditions,  precautions do apply. Overdosage: If you think you have taken too much of this medicine contact a poison control center or emergency room at once. NOTE: This medicine is only for you. Do not share this medicine with others. What if I miss a dose? It is important not to miss your dose. Call your doctor or health care professional if you miss your dose. If you miss a dose due to an On-body Injector failure or leakage, a new dose should be administered as soon as possible using a single prefilled syringe for manual use. What may interact with this medicine? Interactions have not been studied. Give your health care provider a list of all the medicines, herbs, non-prescription drugs, or dietary supplements you use. Also tell them if you smoke, drink alcohol, or use illegal drugs. Some items may interact with your medicine. This list may not describe all possible interactions. Give your health care provider a list of all the medicines, herbs, non-prescription drugs, or dietary supplements you use. Also tell them if you smoke, drink alcohol, or use illegal drugs. Some items may interact with your medicine. What should I watch for while using this medicine? You may need blood work done while you are taking this medicine. If you are going to need a MRI, CT scan, or other procedure, tell your doctor that you are using this medicine (On-Body Injector only). What side effects may I notice from receiving this medicine? Side effects that you should report to your doctor or health care professional as soon as possible: -allergic reactions like skin rash, itching or hives, swelling of the face, lips, or tongue -dizziness -fever -pain, redness, or irritation at site   where injected -pinpoint red spots on the skin -red or dark-brown urine -shortness of breath or breathing problems -stomach or side pain, or pain at the shoulder -swelling -tiredness -trouble passing urine or change in the amount of urine Side  effects that usually do not require medical attention (report to your doctor or health care professional if they continue or are bothersome): -bone pain -muscle pain This list may not describe all possible side effects. Call your doctor for medical advice about side effects. You may report side effects to FDA at 1-800-FDA-1088. Where should I keep my medicine? Keep out of the reach of children. Store pre-filled syringes in a refrigerator between 2 and 8 degrees C (36 and 46 degrees F). Do not freeze. Keep in carton to protect from light. Throw away this medicine if it is left out of the refrigerator for more than 48 hours. Throw away any unused medicine after the expiration date. NOTE: This sheet is a summary. It may not cover all possible information. If you have questions about this medicine, talk to your doctor, pharmacist, or health care provider.  2018 Elsevier/Gold Standard (2016-10-10 12:58:03)  

## 2017-02-01 ENCOUNTER — Encounter: Payer: Self-pay | Admitting: Hematology

## 2017-02-01 ENCOUNTER — Telehealth: Payer: Self-pay | Admitting: Hematology

## 2017-02-01 NOTE — Telephone Encounter (Signed)
Due to YF off day - moved 4/10 lab/fu to 4/11. Left message for patient.

## 2017-02-03 NOTE — Telephone Encounter (Signed)
Spoke with patient today to confirm appointment change. Per patient lab/fu 4/11 moved to 4/13 w/app to better coordinate with her schedule.

## 2017-02-04 ENCOUNTER — Ambulatory Visit: Payer: Managed Care, Other (non HMO) | Admitting: Hematology

## 2017-02-04 ENCOUNTER — Telehealth: Payer: Self-pay | Admitting: *Deleted

## 2017-02-04 ENCOUNTER — Other Ambulatory Visit: Payer: Managed Care, Other (non HMO)

## 2017-02-04 NOTE — Telephone Encounter (Signed)
Faxed 01/31/17 office note to Darrell Jewel. Dye RN/Specialty Case Manager-Oncology to [phone] per Dr Ernestina Penna request.

## 2017-02-04 NOTE — Telephone Encounter (Signed)
"  I am not feeling well.  Received first chemotherapy last Wednesday.   1. Today I'm very nauseous, not eating much.  Last week I was nauseated once, took a pill and it went away.   I have two prescriptions for nausea medicine but do not know if I am to use them both.  Took one this morning and still feel nauseated.  I'd probably feel better if I could throw up.   I ate oatmeal this morning.  Last night I ate lamb and a stuffed pepper with rice.   2. I may be dehydrated because I have a headache between my eyes.  I drink a half gallon water.  No diarrhea.  My bowels moved yesterday.     3. I'm tired so just lying around.   4. I can feel a rash/bumps to my collar near my chest and neck.  Itches a little.  I haven't looked at it but it's more  Than a few bumps.  This is different than  The two to three outbreaks I've had in the past.  No new no new soaps, lotions or detergents.

## 2017-02-05 ENCOUNTER — Other Ambulatory Visit: Payer: Managed Care, Other (non HMO)

## 2017-02-05 ENCOUNTER — Ambulatory Visit: Payer: Managed Care, Other (non HMO) | Admitting: Hematology

## 2017-02-05 NOTE — Telephone Encounter (Signed)
I called her back, and reviewed the management of nausea, hemorrhoids, etc. she had temperature 99.4 this afternoon, no chills, no other new symptoms. I warned her about neutropenic fever, and she knows to call us or go to the emergency room if she has temperature >100.4.  Truitt Merle MD

## 2017-02-07 ENCOUNTER — Other Ambulatory Visit (HOSPITAL_BASED_OUTPATIENT_CLINIC_OR_DEPARTMENT_OTHER): Payer: Managed Care, Other (non HMO)

## 2017-02-07 ENCOUNTER — Ambulatory Visit (HOSPITAL_BASED_OUTPATIENT_CLINIC_OR_DEPARTMENT_OTHER): Payer: Managed Care, Other (non HMO) | Admitting: Nurse Practitioner

## 2017-02-07 ENCOUNTER — Encounter: Payer: Self-pay | Admitting: *Deleted

## 2017-02-07 VITALS — BP 103/89 | HR 98 | Temp 98.4°F | Resp 16 | Wt 103.5 lb

## 2017-02-07 DIAGNOSIS — Z171 Estrogen receptor negative status [ER-]: Secondary | ICD-10-CM

## 2017-02-07 DIAGNOSIS — C50411 Malignant neoplasm of upper-outer quadrant of right female breast: Secondary | ICD-10-CM

## 2017-02-07 DIAGNOSIS — M818 Other osteoporosis without current pathological fracture: Secondary | ICD-10-CM | POA: Diagnosis not present

## 2017-02-07 LAB — COMPREHENSIVE METABOLIC PANEL WITH GFR
ALT: 21 U/L (ref 0–55)
AST: 56 U/L — ABNORMAL HIGH (ref 5–34)
Albumin: 3.6 g/dL (ref 3.5–5.0)
Alkaline Phosphatase: 112 U/L (ref 40–150)
Anion Gap: 8 meq/L (ref 3–11)
BUN: 10.3 mg/dL (ref 7.0–26.0)
CO2: 29 meq/L (ref 22–29)
Calcium: 9.5 mg/dL (ref 8.4–10.4)
Chloride: 102 meq/L (ref 98–109)
Creatinine: 0.7 mg/dL (ref 0.6–1.1)
EGFR: >90 mL/min/{1.73_m2}
Glucose: 102 mg/dL (ref 70–140)
Potassium: 4.1 meq/L (ref 3.5–5.1)
Sodium: 139 meq/L (ref 136–145)
Total Bilirubin: 0.22 mg/dL (ref 0.20–1.20)
Total Protein: 6.5 g/dL (ref 6.4–8.3)

## 2017-02-07 LAB — CBC WITH DIFFERENTIAL/PLATELET
BASO%: 0.3 % (ref 0.0–2.0)
Basophils Absolute: 0.1 10*3/uL (ref 0.0–0.1)
EOS%: 0.1 % (ref 0.0–7.0)
Eosinophils Absolute: 0.1 10*3/uL (ref 0.0–0.5)
HCT: 35.3 % (ref 34.8–46.6)
HGB: 11.6 g/dL (ref 11.6–15.9)
LYMPH%: 3.2 % — ABNORMAL LOW (ref 14.0–49.7)
MCH: 29.5 pg (ref 25.1–34.0)
MCHC: 32.8 g/dL (ref 31.5–36.0)
MCV: 90 fL (ref 79.5–101.0)
MONO#: 2.5 10*3/uL — ABNORMAL HIGH (ref 0.1–0.9)
MONO%: 4.8 % (ref 0.0–14.0)
NEUT#: 47 10*3/uL — ABNORMAL HIGH (ref 1.5–6.5)
NEUT%: 91.6 % — ABNORMAL HIGH (ref 38.4–76.8)
Platelets: 208 10*3/uL (ref 145–400)
RBC: 3.93 10*6/uL (ref 3.70–5.45)
RDW: 12.6 % (ref 11.2–14.5)
WBC: 51.3 10*3/uL (ref 3.9–10.3)
lymph#: 1.6 10*3/uL (ref 0.9–3.3)

## 2017-02-07 NOTE — Progress Notes (Signed)
[place] Work  Clinical Social Work was referred by nurse for assessment of psychosocial needs as pt requested to meet with CSW.  Clinical Social Worker contacted patient at home and left brief message; CSW introduced self, explained role of CSW/Pt and Family Support Team, support groups and other resources to assist. CSW encouraged pt to return call in order to schedule appointment.     Loren Racer, LCSW, OSW-C Clinical Social Worker [place]  [place] Phone: [phone] Fax: [phone]

## 2017-02-07 NOTE — Progress Notes (Signed)
[name]   Diagnosis: Breast cancer, triple negative Oncology History   Cancer Staging Malignant neoplasm of upper outer quadrant of female breast ([place]) Staging form: Breast, AJCC 8th Edition - Clinical: No stage assigned - Unsigned - Pathologic: pT1b, pN0, cM0, ER: Negative, PR: Negative, HER2: Negative - Signed by Eppie Gibson, MD on 01/08/2017       Breast cancer of upper-outer quadrant of right female breast ([place])   02/01/2016 Mammogram    Breast density category B. There are new punctate calcification at the 11:00 position of right breast, highly suspicious for malignancy. Patient refused biopsy      12/05/2016 Initial Diagnosis    Malignant neoplasm of upper outer quadrant of female breast ([place])      12/05/2016 Surgery    Right breast lumpectomy and sentinel lymph node biopsy by Dr. Ernst Bowler at [facility]       12/05/2016 Pathology Results    Right breast lumpectomy and a sentinel lymph node biopsy showed invasive ductal carcinoma, multifocal, tumor size 9 mm, 7 mm and 3 mm. Grade 3. High-grade DCIS with microcalcifications. Final margins were negative for invasive and DCIS. 2 sentinel lymph nodes were negative.      12/05/2016 Receptors her2    ER, PR and HER-2 triple negative      01/22/2017 Imaging    Bone Density  ASSESSMENT: The BMD measured at AP Spine L1-L4 is 0.766 g/cm2 with a T-score of -3.5. This patient is considered OSTEOPOROTIC according to [facility]) criteria. There has been a statistically significant decrease in BMD of left hip since exam dated 03/12/2012. Spine was not compared to prior study due to exclusion of vertebral bodies.      01/29/2017 -  Adjuvant Chemotherapy    Docetaxel and Cytoxan 4 cycles every 3 weeks starting 01/29/17. Neulasta given 2 days after chemo.      CURRENT THERAPY: Docetaxel and Cytoxan 4 cycles every 3 weeks and Neulasta on day 3,  started 01/29/17. INTERVAL HISTORY:   Paula [name] returns for follow-up. She completed cycle 1 docetaxel/Cytoxan 01/29/2017. She had mild nausea day 6 and 7. No vomiting. She took anti-emetics with relief. She noted that one anti-emetic caused drowsiness and the other one caused a headache. She plans to try ginger if she has nausea following cycle 2. No mouth sores. No diarrhea or constipation. No arthralgias or myalgias. She was able to walk in a 5K race a few days after chemotherapy. On 02/03/2017 she developed a rash on the anterior chest/abdomen. The rash is pruritic and is fading/improving.  Objective:  Vital signs in last 24 hours:  Blood pressure 103/89, pulse 98, temperature 98.4 F (36.9 C), temperature source Oral, resp. rate 16, weight 103 lb 8 oz (46.9 kg), SpO2 100 %.    HEENT: No thrush or ulcers. Resp: Lungs clear bilaterally. Cardio: Regular rate and rhythm. GI: Abdomen soft and nontender. No hepatomegaly. Vascular: No leg edema. Calves soft and nontender. Neuro: Alert and oriented.  Skin: Erythematous macular rash scattered over the abdomen.    Lab Results:  Lab Results  Component Value Date   WBC 51.3 (HH) 02/07/2017   HGB 11.6 02/07/2017   HCT 35.3 02/07/2017   MCV 90.0 02/07/2017   PLT 208 02/07/2017   NEUTROABS 47.0 (H) 02/07/2017    Imaging:  No results found.  Medications: I have reviewed the patient's current medications.  Assessment/Plan: 1. 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 Genetic testing; A. Mother and two sisters with breast cancer, all tested negative. Sister with VUS in [place]. B. 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 Being treated by Dr. Julien Nordmann at [facility] in [place]. Previously offered a referral to see infectious disease specialist. She deferred. 4. Osteoporosis. Bone scan 01/22/2017 showed worsening osteoporosis. Dr. Burr Medico advised calcium and vitamin D. She declined steroids for treatment.  Disposition: Paula [name] appears stable. She completed cycle 1 docetaxel/Cytoxan on 01/29/2017. She received Neulasta support. Aside from mild delayed nausea she seems to have tolerated treatment well. She developed a rash on the abdomen earlier this week. We discussed that it may be related to the chemotherapy. The rash is improving. She will continue to manage symptomatically and understands to contact the office if the rash increases.  We reviewed today's labs including the elevated white count related to Neulasta.  She has requested to meet with a [place] pharmacist regarding supplements/herbs. She would also like to meet with the [place] dietitian and social worker. We will make those referrals.  She will return for a follow-up visit and cycle 2 docetaxel/Cytoxan in 2 weeks. She will contact the office in the interim as outlined above or with any other problems.  Plan reviewed with Dr. Burr Medico.     Ned Card ANP/GNP-BC   02/07/2017  10:40 AM

## 2017-02-12 ENCOUNTER — Telehealth: Payer: Self-pay | Admitting: Hematology

## 2017-02-12 ENCOUNTER — Encounter: Payer: Self-pay | Admitting: *Deleted

## 2017-02-12 NOTE — Telephone Encounter (Signed)
Called patient to confirm nutrition appointment for 02/18/17. Left voicemail with appointment details.

## 2017-02-12 NOTE — Progress Notes (Signed)
Paula [name]  Clinical Social [name] was referred by patient for assessment of psychosocial needs. CSW introduced self, explained role of CSW/Pt and Family Support Team, support groups and other resources to assist.  Clinical Social Worker provided supportive counseling session. Pt has concerns about complex family dynamics that were present prior to cancer diagnosis. CSW provided pt with additional counseling resources to address with complex needs. [facility] CSW can assist with adjustment to cancer diagnosis and direct impact. CSW to follow.     Paula Racer, LCSW, OSW-C Clinical Social Worker Paula  [name] Phone: [phone] Fax: [phone]

## 2017-02-13 ENCOUNTER — Telehealth: Payer: Self-pay | Admitting: Hematology

## 2017-02-13 NOTE — Telephone Encounter (Signed)
Per Ashley/schd msg, patient requested to reschedule her nutrition appointment to day of visit. Appointment rescheduled to 02/19/17. Left appointment details on voicemail.

## 2017-02-14 NOTE — Progress Notes (Signed)
[facility]  Telephone:([phone]) [phone] Fax:(336) [phone]  Clinic Follow Up Note   Patient Care Team: Hayden Rasmussen, MD as PCP - General (Family Medicine)   Oncology History   Cancer Staging Malignant neoplasm of upper outer quadrant of female breast [facility]) Staging form: Breast, AJCC 8th Edition - Clinical: No stage assigned - Unsigned - Pathologic: pT1b, pN0, cM0, ER: Negative, PR: Negative, HER2: Negative - Signed by Eppie Gibson, MD on 01/08/2017       Breast cancer of upper-outer quadrant of right female breast ([place])   02/01/2016 Mammogram    Breast density category B. There are new punctate calcification at the 11:00 position of right breast, highly suspicious for malignancy. Patient refused biopsy      12/05/2016 Initial Diagnosis    Malignant neoplasm of upper outer quadrant of female breast ([place])      12/05/2016 Surgery    Right breast lumpectomy and sentinel lymph node biopsy by Dr. Ernst Bowler at [facility]       12/05/2016 Pathology Results    Right breast lumpectomy and a sentinel lymph node biopsy showed invasive ductal carcinoma, multifocal, tumor size 9 mm, 7 mm and 3 mm. Grade 3. High-grade DCIS with microcalcifications. Final margins were negative for invasive and DCIS. 2 sentinel lymph nodes were negative.      12/05/2016 Receptors her2    ER, PR and HER-2 triple negative      01/22/2017 Imaging    Bone Density  ASSESSMENT: The BMD measured at AP Spine L1-L4 is 0.766 g/cm2 with a T-score of -3.5. This patient is considered OSTEOPOROTIC according to [facility]) criteria. There has been a statistically significant decrease in BMD of left hip since exam dated 03/12/2012. Spine was not compared to prior study due to exclusion of vertebral bodies.      01/29/2017 -  Adjuvant Chemotherapy    Docetaxel and Cytoxan 4 cycles every 3 weeks starting 01/29/17. Neulasta given 2 days after chemo.       CHIEF COMPLAINTS:  Follow up right triple  negative breast cancer   HISTORY OF PRESENTING ILLNESS:  Paula [name] 61 y.o. female is here because of recently diagnosed right multifocal ductal carcinoma, triple negative.  She is s/p right lumpectomy and node biopsy on 12/05/2016. She was referred by her radiation oncologist Dr. Isidore Moos. She presents to my clinic by herself today.  The patient had a routine mammogram on 01/2016. The findings demonstrated a group of indeterminate microcalcification over the upper outer right breast for which  A biopsy was recommended, which was declined at the time. The patient opted to pursue a thermogram instead that was reported as normal. A few months after, while walking her dog, and felt pain in her arm. A repeat thermogram was done and showed an abnormality on the opposite side. She then decided to repeat a mammogram.   Repeat mammogram on 10/25/16 showed an irregular 7 mm mass over the posterior third of the upper right breast with few associated microcalcifications, as this mass lies along the posterior edge of the previously described segmental microclacifcations. Targeted ultrasound showed bilobed hypoechoic mass with mild border irregularly over the 10:30 position of the right breast 9 cm from the nipple corresponding to the mass with the border irregularity.  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  Right lumpectomy and sentinel node dissection on 12/05/16 with Dr. Ernst Bowler  revealed multifocal breast cancer - pT1b, pN0.  The patient presents today to discuss the roll that chemotherapy may have to help with her disease. Patient does not live in [place], but in [place] She reports her sisters have experienced the same thing, which has resulted in [place], Stage 0. She is here to get another opinion, due to her cancer being triple negative. She denies any  pain, swelling and has full ROM. Patient does yoga and walk to help. Also denies fever. Patient does not smoke. She does not want to take calcium because she thinks it may have cause colon polyps.   Patient reports history of parasites which was found 20 years ago and tests are still positive. She also has history of EBV virus infection.  Patient is strongly believe homeopathic and alternative medicine, she takes multiple vitamin and herbal supplements.  CURRENT THERAPY: Docetaxel and Cytoxan 4 cycles every 3 weeks and Neulasta on day 3, started 01/29/17.  INTERVAL HISTORY: [name] returns for a follow up and second cycle of Docetaxel and Cytoxan. She reports getting a rash on her chest after chemo that was itchy. She used lotion and it improved. She denies diarrhea. The patient states she felt well after her injection of neulasta.  MEDICAL HISTORY:  Past Medical History:  Diagnosis Date  . Candida infection    in bowels  . 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 [name] infection   . Hypercholesteremia   . Hypothyroid   . Osteoporosis   . Skin cancer    right side of face basal cell    SURGICAL HISTORY: Past Surgical History:  Procedure Laterality Date  . basal cell skin cancer     to right side of face two times  . BREAST SURGERY     right breast biopsy  . TONSILLECTOMY     as a child    SOCIAL HISTORY: Social History   Social History  . Marital status: Married    Spouse name: N/A  . Number of children: N/A  . Years of education: N/A   Occupational History  . Not on file.   Social History Main Topics  . Smoking status: Former Smoker    Packs/day: 0.25    Years: 2.00    Quit date: 03/30/1976  . Smokeless tobacco: Never Used     Comment: she smoked socially as a teenager  . Alcohol use Yes     Comment: rare wine use  . Drug use: No  . Sexual activity: Yes    Birth control/ protection: Post-menopausal   Other Topics Concern  . Not  on file   Social History Narrative  . No narrative on file    FAMILY HISTORY: Family History  Problem Relation Age of Onset  . Cancer Mother 54    breast  . Cancer Father     pancreatic  . Cancer Sister 77    breast  . Heart disease Maternal Grandfather   . Cancer Paternal Grandmother 41    lymphoma  . Cancer Paternal Grandfather 27    stomach  . Cancer Maternal Grandmother 95    breast cancer   . Cancer Sister 27    breast cancer   2 children, not biological  ALLERGIES:  has No Known Allergies.  MEDICATIONS:  Current Outpatient Prescriptions  Medication Sig Dispense Refill  . ARMOUR THYROID 30 MG tablet 1 1/2 TABLET EVERY DAY  3  . Ascorbic Acid (VITAMIN C)  100 MG tablet Take 100 mg by mouth 2 (two) times daily.    . cholecalciferol (VITAMIN D) 1000 UNITS tablet Take 1,000 Units by mouth daily.    Marland Kitchen co-enzyme Q-10 50 MG capsule Take 50 mg by mouth.    . Digestive Enzymes (BETAINE HCL) 650-130 MG CAPS Take 1 capsule by mouth 3 (three) times daily with meals. Betaine HCL [place] and [place]    . Garlic 10 MG CAPS Take by mouth. Dosing unknown    . GLUTAMINE PO Take by mouth. For leaky gut    . Homeopathic Products (FRANKINCENSE UPLIFTING) OIL Inhale into the lungs.    . IODINE, KELP, PO Take by mouth. ? Dose 50m?    .Marland KitchenMAGNESIUM CITRATE PO Take 200 mg by mouth 2 (two) times daily.    . Melatonin 3-2 MG TABS Take by mouth every evening. Actual dose is 3-671m   . niacin 50 MG tablet Take 50 mg by mouth at bedtime.    . OIL OF OREGANO PO Take by mouth 3 (three) times daily with meals.    . ondansetron (ZOFRAN) 8 MG tablet Take 1 tablet (8 mg total) by mouth 2 (two) times daily as needed for refractory nausea / vomiting. Start on day 3 after chemo. 30 tablet 1  . Selenium (V-R SELENIUM) 200 MCG TABS Take by mouth.    . TURMERIC PO Take 250 mg by mouth daily.    . vitamin E (VITAMIN E) 200 UNIT capsule Take 200 Units by mouth 2 (two) times daily.    . Marland KitchenITAMIN K PO  Take 2 mg by mouth daily.    . Marland KitchenINC ASPARTATE PO Take 15 mg by mouth daily. For leaky gut    . prochlorperazine (COMPAZINE) 10 MG tablet Take 1 tablet (10 mg total) by mouth every 6 (six) hours as needed (Nausea or vomiting). (Patient not taking: Reported on 02/07/2017) 30 tablet 1   No current facility-administered medications for this visit.     REVIEW OF SYSTEMS:   Constitutional: Denies fevers, chills or abnormal night sweats, (+) mild fatigue  Eyes: Denies blurriness of vision, double vision or watery eyes Ears, nose, mouth, throat, and face: Denies mucositis or sore throat Respiratory: Denies cough, dyspnea or wheezes Cardiovascular: Denies palpitation, chest discomfort or lower extremity swelling Gastrointestinal:  Denies nausea, heartburn or change in bowel habits Skin: no rash Lymphatics: Denies new lymphadenopathy or easy bruising Neurological:Denies numbness, tingling or new weaknesses Behavioral/Psych: Mood is stable, no new changes  All other systems were reviewed with the patient and are negative.  PHYSICAL EXAMINATION:  ECOG PERFORMANCE STATUS: 0 - Asymptomatic  Vitals:   02/19/17 1333  BP: (!) 112/53  Pulse: 89  Resp: 18  Temp: 98.5 F (36.9 C)   Filed Weights   02/19/17 1333  Weight: 106 lb 6.4 oz (48.3 kg)    GENERAL:alert, no distress and comfortable SKIN: skin color, texture, turgor are normal, no rashes or significant lesions EYES: normal, conjunctiva are pink and non-injected, sclera clear OROPHARYNX:no exudate, no erythema and lips, buccal mucosa, and tongue normal  NECK: supple, thyroid normal size, non-tender, without nodularity LYMPH:  no palpable lymphadenopathy in the cervical, axillary or inguinal LUNGS: clear to auscultation and percussion with normal breathing effort HEART: regular rate & rhythm and no murmurs and no lower extremity edema ABDOMEN:abdomen soft, non-tender and normal bowel sounds Musculoskeletal:no cyanosis of digits and no  clubbing  PSYCH: alert & oriented x 3 with fluent speech NEURO:  no focal motor/sensory deficits BREAST: The incision in right breast above areola well healed. No palpable mass or adenopathy. Nipple pink, no erythema.  LABORATORY DATA:  I have reviewed the data as listed CBC Latest Ref Rng & Units 02/19/2017 02/07/2017 01/29/2017  WBC 3.9 - 10.3 10e3/uL 14.6(H) 51.3(HH) 6.7  Hemoglobin 11.6 - 15.9 g/dL 11.8 11.6 12.8  Hematocrit 34.8 - 46.6 % 36.5 35.3 37.6  Platelets 145 - 400 10e3/uL 449(H) 208 284   CMP Latest Ref Rng & Units 02/19/2017 02/07/2017 01/29/2017  Glucose 70 - 140 mg/dl 103 102 118  BUN 7.0 - 26.0 mg/dL 17.5 10.3 17.2  Creatinine 0.6 - 1.1 mg/dL 0.7 0.7 0.8  Sodium 136 - 145 mEq/L 140 139 136  Potassium 3.5 - 5.1 mEq/L 4.0 4.1 4.3  Chloride 96 - 112 mEq/L - - -  CO2 22 - 29 mEq/L _0 Calcium 8.4 - 10.4 mg/dL 9.7 9.5 9.9  Total Protein 6.4 - 8.3 g/dL 7.1 6.5 6.7  Total Bilirubin 0.20 - 1.20 mg/dL 0.24 0.22 0.23  Alkaline Phos 40 - 150 U/L 99 112 99  AST 5 - 34 U/L 33 56(H) 21  ALT 0 - 55 U/L 48 21 19   PATHOLOGY REPORT  See onc history   RADIOGRAPHIC STUDIES: I have personally reviewed the radiological images as listed and agreed with the findings in the report. Dg Bone Density  Result Date: 01/22/2017 EXAM: DUAL X-RAY ABSORPTIOMETRY (DXA) FOR BONE MINERAL DENSITY IMPRESSION: Referring Physician:  Truitt Merle PATIENT: Name: Paula [name], Paula [name] Patient ID: [id number] Birth Date: 1956-02-06 Height: 64.0 in. Sex: Female Measured: 01/22/2017 Weight: 102.9 lbs. Indications: Caucasian, Estrogen Deficient, History of Osteoporosis, Low Body Weight (783.22), Low Calcium Intake (269.3), Postmenopausal Fractures: None Treatments: Vitamin D (E933.5) ASSESSMENT: The BMD measured at AP Spine L1-L4 is 0.766 g/cm2 with a T-score of -3.5. This patient is considered OSTEOPOROTIC according to [facility]) criteria. There has been a statistically significant decrease in BMD  of left hip since exam dated 03/12/2012. Spine was not compared to prior study due to exclusion of vertebral bodies. Site Region Measured Date Measured Age YA T-score BMD Significant CHANGE AP Spine  L1-L4      01[age]22/2017    59.7         -3.5    0.766 g/cm2 DualFemur Total Left 01/22/2017 59.7 -2.9 0.647 g/cm2 * World Health Organization [facility]) criteria for post-menopausal, Caucasian Women: Normal       T-score at or above -1 SD Osteopenia   T-score between -1 and -2.5 SD Osteoporosis T-score at or below -2.5 SD RECOMMENDATION: [place] recommends that FDA-approved medical therapies be considered in postmenopausal women and men age 20 or older with a: 1. Hip or vertebral (clinical or morphometric) fracture. 2. T-score of <-2.5 at the spine or hip. 3. Ten-year fracture probability by FRAX of 3% or greater for hip fracture or 20% or greater for major osteoporotic fracture. All treatment decisions require clinical judgment and consideration of individual patient factors, including patient preferences, co-morbidities, previous drug use, risk factors not captured in the FRAX model (e.g. falls, vitamin D deficiency, increased bone turnover, interval significant decline in bone density) and possible under - or over-estimation of fracture risk by FRAX. All patients should ensure an adequate intake of dietary calcium (1200 mg/d) and vitamin D (800 IU daily) unless contraindicated. FOLLOW-UP: People with diagnosed cases of osteoporosis or at high risk for fracture should have regular bone mineral density tests.  For patients eligible for Medicare, routine testing is allowed once every 2 years. The testing frequency can be increased to one year for patients who have rapidly progressing disease, those who are receiving or discontinuing medical therapy to restore bone mass, or have additional risk factors. I have reviewed this report, and agree with the above findings. Mark A. Thornton Papas, M.D. [facility]  Radiology Electronically Signed   By: Lavonia Dana M.D.   On: 01/22/2017 15:58    ASSESSMENT & PLAN: 61 y.o. Caucasian female, postmenopausal  1. 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 -She had early stage breast cancer, had a complete surgical resection. -she was reluctant to take adjuvant chemo, and finally has started TC, plan for 4 cycles -Patient takes multiple vitamins and herbs supplements, I advised the patient not tohold herbal supplements during her chemo -She tolerated first cycle chemotherapy well.  -Labs reviewed, adequate for treatment. Second cycle TC today and every 3 weeks. Neulasta injection 2 days after chemo. -Patient is extremely concerned about the side effects of osteoporosis from steroids, and she declined any steroids before or after chemotherapy, she is only agreeable to take steroids if she has severe allergy reaction.  2. Genetic Testing - Mother and two sisters with breast cancer, all tested negative. Sister with VUS in BRCA2. -BRCA2 variant of uncertain significance (VUS) known as c.7759C>T (p.[id number]). - Father had pancreatic cancer   3. 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 She is being treated by Dr. Julien Nordmann at [facility] in [place].  -I offered her a referral to see infection disease specialist if she decides to take chemotherapy. She deferred.  4. Osteoporosis -Bone scan on 01/22/17 revealed worsening osteoporosis. -I advised the patient to take Calcium and Vitamin D supplementation. -The patient does not want to take steroids for her treatment since steroids could weaken her bone. -I discussed that she might develop reactions during chemotherapy; such as a drop in BP, skin rash, itchy skin. -The patient  still insists not have steroids with her IV chemotherapy infusion to reduce the risk of reaction. If she does develop a reaction, she is to take oral Benadryl TID and I will give steroids the following infusion.  5. Skin rash -The patient developed a pruritic skin rash on her chest after chemotherapy that revolved afterwards. -I advised the patient to use oral benadryl and topical hydrocortisone for it.  Plan:   -Second cycle TC today, every 3 weeks. Neulasta on day 3  -No steroids given with chemo today. -Lab, f/u and chemo in 3 weeks  No orders of the defined types were placed in this encounter.   All questions were answered. The patient knows to call the clinic with any problems, questions or concerns.  I spent 20 minutes counseling the patient face to face. The total time spent in the appointment was 30 minutes and more than 50% was on counseling.     Truitt Merle, MD 02/19/2017   This document serves as a record of services personally performed by Truitt Merle, MD. It was created on her behalf by Darcus Austin, a trained medical scribe. The creation of this record is based on the scribe's personal observations and the provider's statements to them. This document has been checked and approved by the attending provider.

## 2017-02-17 ENCOUNTER — Telehealth: Payer: Self-pay

## 2017-02-18 NOTE — Telephone Encounter (Addendum)
Spoke with patient regarding follow up on drug-herbal interactions question. Current chemotherapy regimen along with all herbal medications were analyzed via [employer] drug interaction database. The following drug-herbal interactions were found and discussed with patient:   . Digestive Enzymes, Garlic, Melatonin, & Vitamin E may inhibit liver enzymes to varying degrees and thereby block the clearance of Docetaxel or Cytoxan from your body. . Niacin has been linked to liver toxicity. Cytoxan can have additive toxic effects to the liver.  . Theoretically, ginseng and selenium may decrease the effectiveness of immunosuppressant chemotherapy drugs.   After explaining these interactions with the patient and answering further questions, patient expressed that she is likely to continuing taking these supplements. Further questions that were not answered via phone call will be followed-up while patient is in clinic on 4/25.  Mikael Spray, PharmD Candidate [phone]   Reviewed student note and agree with her findings.   Darl Pikes, PharmD, [place] Clinical Pharmacist- Oncology Pharmacy Resident

## 2017-02-19 ENCOUNTER — Ambulatory Visit (HOSPITAL_BASED_OUTPATIENT_CLINIC_OR_DEPARTMENT_OTHER): Payer: Managed Care, Other (non HMO)

## 2017-02-19 ENCOUNTER — Ambulatory Visit (HOSPITAL_BASED_OUTPATIENT_CLINIC_OR_DEPARTMENT_OTHER): Payer: Managed Care, Other (non HMO) | Admitting: Hematology

## 2017-02-19 ENCOUNTER — Ambulatory Visit: Payer: Managed Care, Other (non HMO) | Admitting: Nutrition

## 2017-02-19 ENCOUNTER — Encounter: Payer: Self-pay | Admitting: Hematology

## 2017-02-19 VITALS — BP 112/53 | HR 89 | Temp 98.5°F | Resp 18 | Ht 64.0 in | Wt 106.4 lb

## 2017-02-19 DIAGNOSIS — E039 Hypothyroidism, unspecified: Secondary | ICD-10-CM | POA: Diagnosis not present

## 2017-02-19 DIAGNOSIS — C50411 Malignant neoplasm of upper-outer quadrant of right female breast: Secondary | ICD-10-CM

## 2017-02-19 DIAGNOSIS — Z5111 Encounter for antineoplastic chemotherapy: Secondary | ICD-10-CM

## 2017-02-19 DIAGNOSIS — Z171 Estrogen receptor negative status [ER-]: Secondary | ICD-10-CM | POA: Diagnosis not present

## 2017-02-19 LAB — COMPREHENSIVE METABOLIC PANEL
ALBUMIN: 4.1 g/dL (ref 3.5–5.0)
ALK PHOS: 99 U/L (ref 40–150)
ALT: 48 U/L (ref 0–55)
AST: 33 U/L (ref 5–34)
Anion Gap: 10 mEq/L (ref 3–11)
BUN: 17.5 mg/dL (ref 7.0–26.0)
CHLORIDE: 103 meq/L (ref 98–109)
CO2: 26 meq/L (ref 22–29)
Calcium: 9.7 mg/dL (ref 8.4–10.4)
Creatinine: 0.7 mg/dL (ref 0.6–1.1)
GLUCOSE: 103 mg/dL (ref 70–140)
POTASSIUM: 4 meq/L (ref 3.5–5.1)
SODIUM: 140 meq/L (ref 136–145)
Total Bilirubin: 0.24 mg/dL (ref 0.20–1.20)
Total Protein: 7.1 g/dL (ref 6.4–8.3)

## 2017-02-19 LAB — CBC WITH DIFFERENTIAL/PLATELET
BASO%: 0.6 % (ref 0.0–2.0)
BASOS ABS: 0.1 10*3/uL (ref 0.0–0.1)
EOS ABS: 0 10*3/uL (ref 0.0–0.5)
EOS%: 0.2 % (ref 0.0–7.0)
HCT: 36.5 % (ref 34.8–46.6)
HEMOGLOBIN: 11.8 g/dL (ref 11.6–15.9)
LYMPH%: 10 % — AB (ref 14.0–49.7)
MCH: 29.6 pg (ref 25.1–34.0)
MCHC: 32.4 g/dL (ref 31.5–36.0)
MCV: 91.5 fL (ref 79.5–101.0)
MONO#: 1 10*3/uL — ABNORMAL HIGH (ref 0.1–0.9)
MONO%: 6.8 % (ref 0.0–14.0)
NEUT#: 12.1 10*3/uL — ABNORMAL HIGH (ref 1.5–6.5)
NEUT%: 82.4 % — AB (ref 38.4–76.8)
Platelets: 449 10*3/uL — ABNORMAL HIGH (ref 145–400)
RBC: 3.99 10*6/uL (ref 3.70–5.45)
RDW: 13.4 % (ref 11.2–14.5)
WBC: 14.6 10*3/uL — ABNORMAL HIGH (ref 3.9–10.3)
lymph#: 1.5 10*3/uL (ref 0.9–3.3)

## 2017-02-19 MED ORDER — HEPARIN SOD (PORK) LOCK FLUSH 100 UNIT/ML IV SOLN
500.0000 [IU] | Freq: Once | INTRAVENOUS | Status: DC | PRN
  Filled 2017-02-19: qty 5

## 2017-02-19 MED ORDER — PALONOSETRON HCL INJECTION 0.25 MG/5ML
INTRAVENOUS | Status: AC
  Filled 2017-02-19: qty 5

## 2017-02-19 MED ORDER — PALONOSETRON HCL INJECTION 0.25 MG/5ML
0.2500 mg | Freq: Once | INTRAVENOUS | Status: AC
  Administered 2017-02-19: 0.25 mg via INTRAVENOUS

## 2017-02-19 MED ORDER — SODIUM CHLORIDE 0.9 % IV SOLN
Freq: Once | INTRAVENOUS | Status: AC
  Administered 2017-02-19: 15:00:00 via INTRAVENOUS

## 2017-02-19 MED ORDER — DOCETAXEL CHEMO INJECTION 160 MG/16ML
75.0000 mg/m2 | Freq: Once | INTRAVENOUS | Status: AC
  Administered 2017-02-19: 110 mg via INTRAVENOUS
  Filled 2017-02-19: qty 11

## 2017-02-19 MED ORDER — CYCLOPHOSPHAMIDE CHEMO INJECTION 1 GM
600.0000 mg/m2 | Freq: Once | INTRAMUSCULAR | Status: AC
  Administered 2017-02-19: 880 mg via INTRAVENOUS
  Filled 2017-02-19: qty 44

## 2017-02-19 MED ORDER — SODIUM CHLORIDE 0.9% FLUSH
10.0000 mL | INTRAVENOUS | Status: DC | PRN
  Filled 2017-02-19: qty 10

## 2017-02-19 NOTE — Telephone Encounter (Addendum)
Followed-up with patient's additional questions while in clinic receiving treatment. Additional herbal supplements recommendations that were added include:  Marland Kitchen MAJOR: Do not take grapefruit with Docetaxel OR Cytoxan as it inhibits liver enzymes known to metabolize these medications.  . Echinacea, Grape seed & Quercetin may also inhibit these liver enzymes to varying degrees and thereby block the clearance of Docetaxel or Cytoxan from your body. Nyoka Cowden tea extracts, & triphala have been linked to liver toxicity. Cytoxan can have additive toxic effects to the liver.   Patient stated that she will now likely discontinue most all her herbal medications with the exception of her Vitamin D3 and ginger to treat nausea. I counseled the patient that other herbals currently in her medication list not listed above but included in the drug interaction check (including ginger) were likely safe to continue & that if she felt any of the above listed were absolutely essential, holding the herbal supplements a few days before and after chemotherapy may be appropriate. Instructed the patient to call [facility] if she has any additional questions. A handout of all interactions results found was provided.  Mikael Spray, PharmD Candidate [phone]  Reviewed student note and agree with her findings.   Darl Pikes, PharmD, [place] Clinical Pharmacist- Oncology Pharmacy Resident

## 2017-02-19 NOTE — Progress Notes (Signed)
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  She is a patient of Dr. Burr Medico.  Past medical history includes osteoporosis, hypothyroidism, hypercholesterolemia, Epstein-Barr, candida infection, tapeworms, and Giardia.  Medications include Armour Thyroid, coenzyme Q 10, magnesium citrate, melatonin, niacin, Zofran, Compazine, vitamin C, vitamin D, garlic, glutamine, frankincense oil, iodine, kelp, oil of oregano, turmeric, vitamin E, vitamin K zinc asparate.  Labs include albumin 3.6 on April 13.  Height: 5 feet 4 inches. Weight: 103.5 pounds. Usual body weight: 113 pounds. BMI: 17.77.  Patient reports she is struggling to gain weight. States she has a goal weight of 120 pounds. She is receiving chemotherapy and had delayed nausea after first therapy treatment. She complains of fatigue. She states nausea medication was effective however, gave her headache and she wishes to use Ginger if she develops any more nausea.  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  Intervention: Patient was educated to consume a healthy plant-based diet with high protein foods. Encouraged patient to add a healthy fats. Encouraged small frequent meals and snacks with protein at each feeding. Provided multiple fact sheets.  Questions were answered. Teach back method used.  Contact information was given.  Monitoring, evaluation, goals: Patient will increase oral intake to promote slow weight gain.  No follow-up required; nutrition diagnosis resolved.  **Disclaimer: This note was dictated with voice recognition software. Similar sounding words can inadvertently be transcribed and this note may contain transcription errors which may not have been corrected upon publication of note.**

## 2017-02-19 NOTE — Patient Instructions (Signed)
[facility] Discharge Instructions for Patients Receiving Chemotherapy  Today you received the following chemotherapy agents;  Taxotere and Cytoxan.    To help prevent nausea and vomiting after your treatment, we encourage you to take your nausea medication as directed.     If you develop nausea and vomiting that is not controlled by your nausea medication, call the clinic.   BELOW ARE SYMPTOMS THAT SHOULD BE REPORTED IMMEDIATELY:  *FEVER GREATER THAN 100.5 F  *CHILLS WITH OR WITHOUT FEVER  NAUSEA AND VOMITING THAT IS NOT CONTROLLED WITH YOUR NAUSEA MEDICATION  *UNUSUAL SHORTNESS OF BREATH  *UNUSUAL BRUISING OR BLEEDING  TENDERNESS IN MOUTH AND THROAT WITH OR WITHOUT PRESENCE OF ULCERS  *URINARY PROBLEMS  *BOWEL PROBLEMS  UNUSUAL RASH Items with * indicate a potential emergency and should be followed up as soon as possible.  Feel free to call the clinic you have any questions or concerns. The clinic phone number is [phone].  Please show the CHEMO ALERT CARD at check-in to the Emergency Department and triage nurse.   

## 2017-02-21 ENCOUNTER — Ambulatory Visit (HOSPITAL_BASED_OUTPATIENT_CLINIC_OR_DEPARTMENT_OTHER): Payer: Managed Care, Other (non HMO)

## 2017-02-21 ENCOUNTER — Telehealth: Payer: Self-pay | Admitting: Oncology

## 2017-02-21 VITALS — BP 100/60 | HR 94 | Temp 98.5°F | Resp 20

## 2017-02-21 DIAGNOSIS — Z5189 Encounter for other specified aftercare: Secondary | ICD-10-CM

## 2017-02-21 DIAGNOSIS — C50411 Malignant neoplasm of upper-outer quadrant of right female breast: Secondary | ICD-10-CM | POA: Diagnosis not present

## 2017-02-21 DIAGNOSIS — Z171 Estrogen receptor negative status [ER-]: Principal | ICD-10-CM

## 2017-02-21 MED ORDER — PEGFILGRASTIM INJECTION 6 MG/0.6ML ~~LOC~~
6.0000 mg | PREFILLED_SYRINGE | Freq: Once | SUBCUTANEOUS | Status: AC
  Administered 2017-02-21: 6 mg via SUBCUTANEOUS
  Filled 2017-02-21: qty 0.6

## 2017-02-21 NOTE — Telephone Encounter (Signed)
Patient bypassed scheduling on 02/19/17. Appointments scheduled per 02/19/17 los. Patient will pick up copy of schedule at next visit.

## 2017-02-21 NOTE — Patient Instructions (Signed)
Pegfilgrastim injection What is this medicine? PEGFILGRASTIM (PEG fil gra stim) is a long-acting granulocyte colony-stimulating factor that stimulates the growth of neutrophils, a type of white blood cell important in the body's fight against infection. It is used to reduce the incidence of fever and infection in patients with certain types of cancer who are receiving chemotherapy that affects the bone marrow, and to increase survival after being exposed to high doses of radiation. This medicine may be used for other purposes; ask your health care provider or pharmacist if you have questions. COMMON BRAND NAME(S): Neulasta What should I tell my health care provider before I take this medicine? They need to know if you have any of these conditions: -kidney disease -latex allergy -ongoing radiation therapy -sickle cell disease -skin reactions to acrylic adhesives (On-Body Injector only) -an unusual or allergic reaction to pegfilgrastim, filgrastim, other medicines, foods, dyes, or preservatives -pregnant or trying to get pregnant -breast-feeding How should I use this medicine? This medicine is for injection under the skin. If you get this medicine at home, you will be taught how to prepare and give the pre-filled syringe or how to use the On-body Injector. Refer to the patient Instructions for Use for detailed instructions. Use exactly as directed. Tell your healthcare provider immediately if you suspect that the On-body Injector may not have performed as intended or if you suspect the use of the On-body Injector resulted in a missed or partial dose. It is important that you put your used needles and syringes in a special sharps container. Do not put them in a trash can. If you do not have a sharps container, call your pharmacist or healthcare provider to get one. Talk to your pediatrician regarding the use of this medicine in children. While this drug may be prescribed for selected conditions,  precautions do apply. Overdosage: If you think you have taken too much of this medicine contact a poison control center or emergency room at once. NOTE: This medicine is only for you. Do not share this medicine with others. What if I miss a dose? It is important not to miss your dose. Call your doctor or health care professional if you miss your dose. If you miss a dose due to an On-body Injector failure or leakage, a new dose should be administered as soon as possible using a single prefilled syringe for manual use. What may interact with this medicine? Interactions have not been studied. Give your health care provider a list of all the medicines, herbs, non-prescription drugs, or dietary supplements you use. Also tell them if you smoke, drink alcohol, or use illegal drugs. Some items may interact with your medicine. This list may not describe all possible interactions. Give your health care provider a list of all the medicines, herbs, non-prescription drugs, or dietary supplements you use. Also tell them if you smoke, drink alcohol, or use illegal drugs. Some items may interact with your medicine. What should I watch for while using this medicine? You may need blood work done while you are taking this medicine. If you are going to need a MRI, CT scan, or other procedure, tell your doctor that you are using this medicine (On-Body Injector only). What side effects may I notice from receiving this medicine? Side effects that you should report to your doctor or health care professional as soon as possible: -allergic reactions like skin rash, itching or hives, swelling of the face, lips, or tongue -dizziness -fever -pain, redness, or irritation at site   where injected -pinpoint red spots on the skin -red or dark-brown urine -shortness of breath or breathing problems -stomach or side pain, or pain at the shoulder -swelling -tiredness -trouble passing urine or change in the amount of urine Side  effects that usually do not require medical attention (report to your doctor or health care professional if they continue or are bothersome): -bone pain -muscle pain This list may not describe all possible side effects. Call your doctor for medical advice about side effects. You may report side effects to FDA at 1-800-FDA-1088. Where should I keep my medicine? Keep out of the reach of children. Store pre-filled syringes in a refrigerator between 2 and 8 degrees C (36 and 46 degrees F). Do not freeze. Keep in carton to protect from light. Throw away this medicine if it is left out of the refrigerator for more than 48 hours. Throw away any unused medicine after the expiration date. NOTE: This sheet is a summary. It may not cover all possible information. If you have questions about this medicine, talk to your doctor, pharmacist, or health care provider.  2018 Elsevier/Gold Standard (2016-10-10 12:58:03)  

## 2017-03-03 ENCOUNTER — Other Ambulatory Visit: Payer: Self-pay | Admitting: Hematology

## 2017-03-03 ENCOUNTER — Telehealth: Payer: Self-pay

## 2017-03-03 DIAGNOSIS — Z171 Estrogen receptor negative status [ER-]: Principal | ICD-10-CM

## 2017-03-03 DIAGNOSIS — C50411 Malignant neoplasm of upper-outer quadrant of right female breast: Secondary | ICD-10-CM

## 2017-03-03 NOTE — Telephone Encounter (Signed)
OK, I will order the referral.   Truitt Merle MD

## 2017-03-03 NOTE — Telephone Encounter (Signed)
Yes she wants to be referred to ID

## 2017-03-03 NOTE — Telephone Encounter (Signed)
Please ask pt to see if she wants to be referred to ID, to address her concern about giardia and tapeworms. I will review other concerns when I see her next time. Let us know if she has any other concerns which needs to be addressed before her next visit. Thanks.  Truitt Merle MD

## 2017-03-03 NOTE — Telephone Encounter (Signed)
  Pt is having floating stools, the last cycle this cleared up. This cycle it is lasting longer. She has giardia and tapeworms. She is wanting a referral to infectious disease for this. Dr Burr Medico had talked with pt before and she had postponed referral but she is ready now.  R ankle has been swollen over this weekend. It has gotten a lot better with some exercises and after a hike/run. Now there is very little discernible difference between R and L. No pain involved, no redness. Does have small spots on 2 on L and 4 on R thighs, are blanchable but return. No pain.   R breast aching at times. Prickling sensation in R armpit and R upper arm. Off and on. These have been for most of treatment time. She noticed started getting worse and she stopped laying on it, did some exercises and put it on pillows in bed and it is getting better again. She wanted Dr Burr Medico to be aware of this.  She has been feeling some discomfort "something not right" in her RUQ abdomen even before starting chemo. She is believing this is related to the giardia and tapeworms. She wants Dr Burr Medico to be aware of this.

## 2017-03-03 NOTE — Telephone Encounter (Signed)
s/w pt re Dr Ernestina Penna response

## 2017-03-06 ENCOUNTER — Telehealth: Payer: Self-pay

## 2017-03-06 NOTE — Telephone Encounter (Signed)
Pt had a couple tick bites. She has an appt with her PCP to check for lymes dx. The tick bites were in the last 2 weeks. She is more tired and the ankle swelling she called about on 5/7 was after the tick bites. She is calling to keep Dr Burr Medico informed.

## 2017-03-12 ENCOUNTER — Other Ambulatory Visit (HOSPITAL_BASED_OUTPATIENT_CLINIC_OR_DEPARTMENT_OTHER): Payer: Managed Care, Other (non HMO)

## 2017-03-12 ENCOUNTER — Ambulatory Visit: Payer: Managed Care, Other (non HMO)

## 2017-03-12 ENCOUNTER — Ambulatory Visit: Payer: Managed Care, Other (non HMO) | Admitting: Hematology

## 2017-03-12 ENCOUNTER — Ambulatory Visit (HOSPITAL_BASED_OUTPATIENT_CLINIC_OR_DEPARTMENT_OTHER): Payer: Managed Care, Other (non HMO)

## 2017-03-12 ENCOUNTER — Other Ambulatory Visit: Payer: Managed Care, Other (non HMO)

## 2017-03-12 ENCOUNTER — Encounter: Payer: Self-pay | Admitting: Radiation Oncology

## 2017-03-12 ENCOUNTER — Ambulatory Visit (HOSPITAL_BASED_OUTPATIENT_CLINIC_OR_DEPARTMENT_OTHER): Payer: Managed Care, Other (non HMO) | Admitting: Hematology

## 2017-03-12 ENCOUNTER — Encounter: Payer: Self-pay | Admitting: Hematology

## 2017-03-12 VITALS — BP 104/61 | HR 91 | Temp 97.6°F | Resp 18 | Ht 64.0 in | Wt 108.3 lb

## 2017-03-12 DIAGNOSIS — C50411 Malignant neoplasm of upper-outer quadrant of right female breast: Secondary | ICD-10-CM

## 2017-03-12 DIAGNOSIS — M818 Other osteoporosis without current pathological fracture: Secondary | ICD-10-CM | POA: Diagnosis not present

## 2017-03-12 DIAGNOSIS — Z171 Estrogen receptor negative status [ER-]: Secondary | ICD-10-CM | POA: Diagnosis not present

## 2017-03-12 DIAGNOSIS — Z5111 Encounter for antineoplastic chemotherapy: Secondary | ICD-10-CM

## 2017-03-12 DIAGNOSIS — R21 Rash and other nonspecific skin eruption: Secondary | ICD-10-CM

## 2017-03-12 LAB — COMPREHENSIVE METABOLIC PANEL
ALT: 34 U/L (ref 0–55)
ANION GAP: 8 meq/L (ref 3–11)
AST: 28 U/L (ref 5–34)
Albumin: 3.9 g/dL (ref 3.5–5.0)
Alkaline Phosphatase: 100 U/L (ref 40–150)
BILIRUBIN TOTAL: 0.28 mg/dL (ref 0.20–1.20)
BUN: 15.9 mg/dL (ref 7.0–26.0)
CALCIUM: 9.8 mg/dL (ref 8.4–10.4)
CO2: 25 mEq/L (ref 22–29)
CREATININE: 0.7 mg/dL (ref 0.6–1.1)
Chloride: 107 mEq/L (ref 98–109)
Glucose: 93 mg/dl (ref 70–140)
Potassium: 4.3 mEq/L (ref 3.5–5.1)
Sodium: 140 mEq/L (ref 136–145)
TOTAL PROTEIN: 6.9 g/dL (ref 6.4–8.3)

## 2017-03-12 LAB — CBC WITH DIFFERENTIAL/PLATELET
BASO%: 0.7 % (ref 0.0–2.0)
Basophils Absolute: 0.1 10*3/uL (ref 0.0–0.1)
EOS ABS: 0 10*3/uL (ref 0.0–0.5)
EOS%: 0.2 % (ref 0.0–7.0)
HEMATOCRIT: 33 % — AB (ref 34.8–46.6)
HGB: 11 g/dL — ABNORMAL LOW (ref 11.6–15.9)
LYMPH#: 1.2 10*3/uL (ref 0.9–3.3)
LYMPH%: 9.7 % — AB (ref 14.0–49.7)
MCH: 30.8 pg (ref 25.1–34.0)
MCHC: 33.4 g/dL (ref 31.5–36.0)
MCV: 92.1 fL (ref 79.5–101.0)
MONO#: 1.2 10*3/uL — AB (ref 0.1–0.9)
MONO%: 9.9 % (ref 0.0–14.0)
NEUT#: 9.5 10*3/uL — ABNORMAL HIGH (ref 1.5–6.5)
NEUT%: 79.5 % — ABNORMAL HIGH (ref 38.4–76.8)
PLATELETS: 326 10*3/uL (ref 145–400)
RBC: 3.58 10*6/uL — ABNORMAL LOW (ref 3.70–5.45)
RDW: 15.3 % — ABNORMAL HIGH (ref 11.2–14.5)
WBC: 12 10*3/uL — ABNORMAL HIGH (ref 3.9–10.3)

## 2017-03-12 MED ORDER — PALONOSETRON HCL INJECTION 0.25 MG/5ML
0.2500 mg | Freq: Once | INTRAVENOUS | Status: AC
  Administered 2017-03-12: 0.25 mg via INTRAVENOUS

## 2017-03-12 MED ORDER — PALONOSETRON HCL INJECTION 0.25 MG/5ML
INTRAVENOUS | Status: AC
  Filled 2017-03-12: qty 5

## 2017-03-12 MED ORDER — SODIUM CHLORIDE 0.9 % IV SOLN
Freq: Once | INTRAVENOUS | Status: AC
  Administered 2017-03-12: 15:00:00 via INTRAVENOUS

## 2017-03-12 MED ORDER — DEXTROSE 5 % IV SOLN
75.0000 mg/m2 | Freq: Once | INTRAVENOUS | Status: AC
  Administered 2017-03-12: 110 mg via INTRAVENOUS
  Filled 2017-03-12: qty 11

## 2017-03-12 MED ORDER — SODIUM CHLORIDE 0.9 % IV SOLN
600.0000 mg/m2 | Freq: Once | INTRAVENOUS | Status: AC
  Administered 2017-03-12: 880 mg via INTRAVENOUS
  Filled 2017-03-12: qty 44

## 2017-03-12 NOTE — Patient Instructions (Signed)
[facility] Discharge Instructions for Patients Receiving Chemotherapy  Today you received the following chemotherapy agents;  Taxotere and Cytoxan.    To help prevent nausea and vomiting after your treatment, we encourage you to take your nausea medication as directed.     If you develop nausea and vomiting that is not controlled by your nausea medication, call the clinic.   BELOW ARE SYMPTOMS THAT SHOULD BE REPORTED IMMEDIATELY:  *FEVER GREATER THAN 100.5 F  *CHILLS WITH OR WITHOUT FEVER  NAUSEA AND VOMITING THAT IS NOT CONTROLLED WITH YOUR NAUSEA MEDICATION  *UNUSUAL SHORTNESS OF BREATH  *UNUSUAL BRUISING OR BLEEDING  TENDERNESS IN MOUTH AND THROAT WITH OR WITHOUT PRESENCE OF ULCERS  *URINARY PROBLEMS  *BOWEL PROBLEMS  UNUSUAL RASH Items with * indicate a potential emergency and should be followed up as soon as possible.  Feel free to call the clinic you have any questions or concerns. The clinic phone number is [phone].  Please show the CHEMO ALERT CARD at check-in to the Emergency Department and triage nurse.   

## 2017-03-12 NOTE — Progress Notes (Signed)
[place]  [phone] [phone]  Clinic Follow Up Note   Patient Care Team: Hayden Rasmussen, MD as PCP - General (Family Medicine)   Oncology History   Cancer Staging Malignant neoplasm of upper outer quadrant of female breast [facility]) Staging form: Breast, AJCC 8th Edition - Clinical: No stage assigned - Unsigned - Pathologic: pT1b, pN0, cM0, ER: Negative, PR: Negative, HER2: Negative - Signed by Eppie Gibson, MD on 01/08/2017       Breast cancer of upper-outer quadrant of right female breast ([place])   02/01/2016 Mammogram    Breast density category B. There are new punctate calcification at the 11:00 position of right breast, highly suspicious for malignancy. Patient refused biopsy      12/05/2016 Initial Diagnosis    Malignant neoplasm of upper outer quadrant of female breast ([place])      12/05/2016 Surgery    Right breast lumpectomy and sentinel lymph node biopsy by Dr. Ernst Bowler at [facility]       12/05/2016 Pathology Results    Right breast lumpectomy and a sentinel lymph node biopsy showed invasive ductal carcinoma, multifocal, tumor size 9 mm, 7 mm and 3 mm. Grade 3. High-grade DCIS with microcalcifications. Final margins were negative for invasive and DCIS. 2 sentinel lymph nodes were negative.      12/05/2016 Receptors her2    ER, PR and HER-2 triple negative      01/22/2017 Imaging    Bone Density  ASSESSMENT: The BMD measured at AP Spine L1-L4 is 0.766 g/cm2 with a T-score of -3.5. This patient is considered OSTEOPOROTIC according to [facility]) criteria. There has been a statistically significant decrease in BMD of left hip since exam dated 03/12/2012. Spine was not compared to prior study due to exclusion of vertebral bodies.      01/29/2017 -  Adjuvant Chemotherapy    Docetaxel and Cytoxan 4 cycles every 3 weeks starting 01/29/17. Neulasta given 2 days after chemo.       CHIEF COMPLAINTS:  Follow up right  triple negative breast cancer   HISTORY OF PRESENTING ILLNESS:  Paula [name] 61 y.o. female is here because of recently diagnosed right multifocal ductal carcinoma, triple negative.  She is s/p right lumpectomy and node biopsy on 12/05/2016. She was referred by her radiation oncologist Dr. Isidore Moos. She presents to my clinic by herself today.  The patient had a routine mammogram on 01/2016. The findings demonstrated a group of indeterminate microcalcification over the upper outer right breast for which  A biopsy was recommended, which was declined at the time. The patient opted to pursue a thermogram instead that was reported as normal. A few months after, while walking her dog, and felt pain in her arm. A repeat thermogram was done and showed an abnormality on the opposite side. She then decided to repeat a mammogram.   Repeat mammogram on 10/25/16 showed an irregular 7 mm mass over the posterior third of the upper right breast with few associated microcalcifications, as this mass lies along the posterior edge of the previously described segmental microclacifcations. Targeted ultrasound showed bilobed hypoechoic mass with mild border irregularly over the 10:30 position of the right breast 9 cm from the nipple corresponding to the mass with the border irregularity.  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  Right lumpectomy and sentinel node dissection on 12/05/16 with Dr. Ernst Bowler  revealed multifocal breast cancer - pT1b, pN0.  The patient presents today to discuss the roll that chemotherapy may have to help with her disease. Patient does not live in [place], but in [place] She reports her sisters have experienced the same thing, which has resulted in [place], Stage 0. She is here to get another opinion, due to her cancer being triple negative. She  denies any pain, swelling and has full ROM. Patient does yoga and walk to help. Also denies fever. Patient does not smoke. She does not want to take calcium because she thinks it may have cause colon polyps.   Patient reports history of parasites which was found 20 years ago and tests are still positive. She also has history of EBV virus infection.  Patient is strongly believe homeopathic and alternative medicine, she takes multiple vitamin and herbal supplements.  CURRENT THERAPY: Docetaxel and Cytoxan 4 cycles every 3 weeks and Neulasta on day 3, started 01/29/17.  INTERVAL HISTORY: Paula [name] returns for a follow up and cycle 3 of Docetaxel and Cytoxan. She presents to the clinic today with her partner. She reports finding 5 Deer Ticks on leg. She has 2 acres and a cat and dog. She reports Her symptoms took longer to effect her. She felt really exahusted and depressed. Not sleeping well. She reports to feeling better this week. She reports to having floating stool. She does not want to take Neulasta due to her WBC spiking but agreed to taking neulasta now.  She has not being having bone pain. Did not get rash this round of chemo.   July 11th for 5 days she will be on a trip. Her last cycle will be June 6th    REVIEW OF SYSTEMS:   Constitutional: Denies fevers, chills or abnormal night sweats, (+) fatigue/exhausted Eyes: Denies blurriness of vision, double vision or watery eyes Ears, nose, mouth, throat, and face: Denies mucositis or sore throat Respiratory: Denies cough, dyspnea or wheezes Cardiovascular: Denies palpitation, chest discomfort or lower extremity swelling Gastrointestinal:  Denies nausea, heartburn (+) floating stool  Skin: no rash Lymphatics: Denies new lymphadenopathy or easy bruising Neurological:Denies numbness, tingling or new weaknesses Behavioral/Psych: Mood is stable, no new changes (+) one time occurrence of depression All other systems were reviewed with the  patient and are negative.   MEDICAL HISTORY:  Past Medical History:  Diagnosis Date  . Candida infection    in bowels  . 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 [name] infection   . Hypercholesteremia   . Hypothyroid   . Osteoporosis   . Skin cancer    right side of face basal cell    SURGICAL HISTORY: Past Surgical History:  Procedure Laterality Date  . basal cell skin cancer     to right side of face two times  . BREAST SURGERY     right breast biopsy  . TONSILLECTOMY     as a child    SOCIAL HISTORY: Social History   Social History  . Marital status: Married    Spouse name: N/A  . Number of children: N/A  . Years of education: N/A   Occupational History  . Not on file.   Social History Main Topics  . Smoking status: Former Smoker    Packs/day: 0.25    Years: 2.00    Quit date: 03/30/1976  . Smokeless tobacco: Never Used     Comment: she smoked socially as a teenager  .  Alcohol use Yes     Comment: rare wine use  . Drug use: No  . Sexual activity: Yes    Birth control/ protection: Post-menopausal   Other Topics Concern  . Not on file   Social History Narrative  . No narrative on file    FAMILY HISTORY: Family History  Problem Relation Age of Onset  . Cancer Mother 8       breast  . Cancer Father        pancreatic  . Cancer Sister 3       breast  . Heart disease Maternal Grandfather   . Cancer Paternal Grandmother 69       lymphoma  . Cancer Paternal Grandfather 90       stomach  . Cancer Maternal Grandmother 95       breast cancer   . Cancer Sister 27       breast cancer   2 children, not biological  ALLERGIES:  has No Known Allergies.  MEDICATIONS:  Current Outpatient Prescriptions  Medication Sig Dispense Refill  . ARMOUR THYROID 30 MG tablet 1 1/2 TABLET EVERY DAY  3  . Ascorbic Acid (VITAMIN C) 100 MG tablet Take 100 mg by mouth 2 (two) times daily.    . cholecalciferol (VITAMIN D) 1000 UNITS tablet Take  1,000 Units by mouth daily.    Marland Kitchen co-enzyme Q-10 50 MG capsule Take 50 mg by mouth.    . Digestive Enzymes (BETAINE HCL) 650-130 MG CAPS Take 1 capsule by mouth 3 (three) times daily with meals. Betaine HCL [place]    . Garlic 10 MG CAPS Take by mouth. Dosing unknown    . GLUTAMINE PO Take by mouth. For leaky gut    . Homeopathic Products (FRANKINCENSE UPLIFTING) OIL Inhale into the lungs.    . IODINE, KELP, PO Take by mouth. ? Dose 24m?    .Marland KitchenMAGNESIUM CITRATE PO Take 200 mg by mouth 2 (two) times daily.    . Melatonin 3-2 MG TABS Take by mouth every evening. Actual dose is 3-634m   . niacin 50 MG tablet Take 50 mg by mouth at bedtime.    . OIL OF OREGANO PO Take by mouth 3 (three) times daily with meals.    . ondansetron (ZOFRAN) 8 MG tablet Take 1 tablet (8 mg total) by mouth 2 (two) times daily as needed for refractory nausea / vomiting. Start on day 3 after chemo. 30 tablet 1  . prochlorperazine (COMPAZINE) 10 MG tablet Take 1 tablet (10 mg total) by mouth every 6 (six) hours as needed (Nausea or vomiting). (Patient not taking: Reported on 02/07/2017) 30 tablet 1  . Selenium (V-R SELENIUM) 200 MCG TABS Take by mouth.    . TURMERIC PO Take 250 mg by mouth daily.    . vitamin E (VITAMIN E) 200 UNIT capsule Take 200 Units by mouth 2 (two) times daily.    . Marland KitchenITAMIN K PO Take 2 mg by mouth daily.    . Marland KitchenINC ASPARTATE PO Take 15 mg by mouth daily. For leaky gut     No current facility-administered medications for this visit.     PHYSICAL EXAMINATION:  ECOG PERFORMANCE STATUS: 0 - Asymptomatic  Vitals:   03/12/17 1336  BP: 104/61  Pulse: 91  Resp: 18  Temp: 97.6 F (36.4 C)   Filed Weights   03/12/17 1336  Weight: 108 lb 4.8 oz (49.1 kg)    GENERAL:alert, no distress  and comfortable SKIN: skin color, texture, turgor are normal, no rashes or significant lesions EYES: normal, conjunctiva are pink and non-injected, sclera clear OROPHARYNX:no exudate, no erythema  and lips, buccal mucosa, and tongue normal  NECK: supple, thyroid normal size, non-tender, without nodularity LYMPH:  no palpable lymphadenopathy in the cervical, axillary or inguinal LUNGS: clear to auscultation and percussion with normal breathing effort HEART: regular rate & rhythm and no murmurs and no lower extremity edema ABDOMEN:abdomen soft, non-tender and normal bowel sounds Musculoskeletal:no cyanosis of digits and no clubbing  PSYCH: alert & oriented x 3 with fluent speech NEURO: no focal motor/sensory deficits BREAST: The incision in right breast above areola well healed. No palpable mass or adenopathy. Nipple pink, no erythema.  LABORATORY DATA:  I have reviewed the data as listed CBC Latest Ref Rng & Units 03/12/2017 02/19/2017 02/07/2017  WBC 3.9 - 10.3 10e3/uL 12.0(H) 14.6(H) 51.3(HH)  Hemoglobin 11.6 - 15.9 g/dL 11.0(L) 11.8 11.6  Hematocrit 34.8 - 46.6 % 33.0(L) 36.5 35.3  Platelets 145 - 400 10e3/uL 326 449(H) 208   CMP Latest Ref Rng & Units 03/12/2017 02/19/2017 02/07/2017  Glucose 70 - 140 mg/dl 93 103 102  BUN 7.0 - 26.0 mg/dL 15.9 17.5 10.3  Creatinine 0.6 - 1.1 mg/dL 0.7 0.7 0.7  Sodium 136 - 145 mEq/L 140 140 139  Potassium 3.5 - 5.1 mEq/L 4.3 4.0 4.1  Chloride 96 - 112 mEq/L - - -  CO2 22 - 29 mEq/L _0 Calcium 8.4 - 10.4 mg/dL 9.8 9.7 9.5  Total Protein 6.4 - 8.3 g/dL 6.9 7.1 6.5  Total Bilirubin 0.20 - 1.20 mg/dL 0.28 0.24 0.22  Alkaline Phos 40 - 150 U/L 100 99 112  AST 5 - 34 U/L 28 33 56(H)  ALT 0 - 55 U/L 34 48 21   PATHOLOGY REPORT  See onc history   RADIOGRAPHIC STUDIES: I have personally reviewed the radiological images as listed and agreed with the findings in the report. No results found.  ASSESSMENT & PLAN: 61 y.o. Caucasian female, postmenopausal  1. 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 -She had early stage breast cancer, had a complete surgical resection. -she was reluctant to take adjuvant chemo, and finally has started TC, plan for 4 cycles -Patient takes multiple vitamins and herbs supplements, I advised the patient not tohold herbal supplements during her chemo -She tolerated first cycle chemotherapy well.  -Labs reviewed, adequate for treatment. Second cycle TC today and every 3 weeks. Neulasta injection 2 days after chemo. -Patient is extremely concerned about the side effects of osteoporosis from steroids, and she declined any steroids before or after chemotherapy, she is only agreeable to take steroids if she has severe allergy reaction. -We discussed the benefits and side effects to neulasta. Patient agrees to take neulasta with treatment  -Labs reviewed and adequate for cycle 3 treatment today  -She will return in 3 weeks for her last cycle chemotherapy -she has scheduled to see Dr. Isidore Moos after her last cycle chemotherapy  2. Genetic Testing - Mother and two sisters with breast cancer, all tested negative. Sister with VUS in BRCA2. -BRCA2 variant of uncertain significance (VUS) known as [id number] ([id number]). - Father had pancreatic cancer   3. 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 She is being treated by Dr. Julien Nordmann  at [facility] in [place].  -I offered her a referral to see infection disease specialist if she decides to take chemotherapy. She deferred.  4. Osteoporosis -Bone scan on 01/22/17 revealed worsening osteoporosis. -I advised the patient to take Calcium and Vitamin D supplementation. -The patient does not want to take steroids for her treatment since steroids could weaken her bone. -I discussed that she might develop reactions during chemotherapy; such as a drop in BP, skin rash, itchy skin. -The patient still insists not have steroids with  her IV chemotherapy infusion to reduce the risk of reaction. If she does develop a reaction, she is to take oral Benadryl TID and I will give steroids the following infusion.  5. Skin rash -The patient developed a pruritic skin rash on her chest after chemotherapy that revolved afterwards. -I again advised the patient to use oral benadryl and topical hydrocortisone for it.  Plan:   -3rd cycle TC today, every 3 weeks. Neulasta on day 3 -labs and f/u in 3 weeks for last cycle chemo TC     No orders of the defined types were placed in this encounter.   All questions were answered. The patient knows to call the clinic with any problems, questions or concerns.  I spent 20 minutes counseling the patient face to face. The total time spent in the appointment was 30 minutes and more than 50% was on counseling.     Truitt Merle, MD 03/12/2017   This document serves as a record of services personally performed by Truitt Merle, MD. It was created on her behalf by Joslyn Devon, a trained medical scribe. The creation of this record is based on the scribe's personal observations and the provider's statements to them. This document has been checked and approved by the attending provider.

## 2017-03-14 ENCOUNTER — Ambulatory Visit: Payer: Managed Care, Other (non HMO)

## 2017-03-14 ENCOUNTER — Telehealth: Payer: Self-pay | Admitting: *Deleted

## 2017-03-14 NOTE — Telephone Encounter (Signed)
Pt called and left message wanting to inform Dr. Burr Medico re:  Pt decided not to receive Neulasta injection today.  Pt would like to see how her counts would be without Neulasta. Pt's    Phone      [phone].

## 2017-03-19 ENCOUNTER — Telehealth: Payer: Self-pay | Admitting: Hematology

## 2017-03-19 NOTE — Telephone Encounter (Signed)
No los per 03/12/17 visit.

## 2017-03-24 ENCOUNTER — Emergency Department (HOSPITAL_COMMUNITY)
Admission: EM | Admit: 2017-03-24 | Discharge: 2017-03-24 | Discharge status: Against Medical Advice | Payer: Managed Care, Other (non HMO)

## 2017-03-24 NOTE — ED Triage Notes (Signed)
Went to triage patient, she has decided to leave and come back if any symptoms occur. Pt reports her temperature was 99.5 F oral here and is denying any symptoms. Advised to please come back with any changes in her condition. Pt is alert, oriented x 4 and appears in no acute distress.

## 2017-03-25 ENCOUNTER — Emergency Department (HOSPITAL_COMMUNITY): Payer: Managed Care, Other (non HMO)

## 2017-03-25 ENCOUNTER — Inpatient Hospital Stay (HOSPITAL_COMMUNITY)
Admission: EM | Admit: 2017-03-25 | Discharge: 2017-03-27 | DRG: 810 | Disposition: A | Discharge status: Home / Self Care | Payer: Managed Care, Other (non HMO) | Attending: Internal Medicine | Admitting: Internal Medicine

## 2017-03-25 ENCOUNTER — Encounter (HOSPITAL_COMMUNITY): Payer: Self-pay | Admitting: Emergency Medicine

## 2017-03-25 ENCOUNTER — Telehealth: Payer: Self-pay

## 2017-03-25 DIAGNOSIS — Z8249 Family history of ischemic heart disease and other diseases of the circulatory system: Secondary | ICD-10-CM

## 2017-03-25 DIAGNOSIS — Z803 Family history of malignant neoplasm of breast: Secondary | ICD-10-CM

## 2017-03-25 DIAGNOSIS — E785 Hyperlipidemia, unspecified: Secondary | ICD-10-CM | POA: Diagnosis present

## 2017-03-25 DIAGNOSIS — I700 Atherosclerosis of aorta: Secondary | ICD-10-CM | POA: Diagnosis present

## 2017-03-25 DIAGNOSIS — D6481 Anemia due to antineoplastic chemotherapy: Secondary | ICD-10-CM | POA: Diagnosis not present

## 2017-03-25 DIAGNOSIS — T451X5A Adverse effect of antineoplastic and immunosuppressive drugs, initial encounter: Secondary | ICD-10-CM | POA: Diagnosis present

## 2017-03-25 DIAGNOSIS — Z87891 Personal history of nicotine dependence: Secondary | ICD-10-CM

## 2017-03-25 DIAGNOSIS — C50919 Malignant neoplasm of unspecified site of unspecified female breast: Secondary | ICD-10-CM

## 2017-03-25 DIAGNOSIS — B719 Cestode infection, unspecified: Secondary | ICD-10-CM | POA: Diagnosis present

## 2017-03-25 DIAGNOSIS — R05 Cough: Secondary | ICD-10-CM

## 2017-03-25 DIAGNOSIS — E7800 Pure hypercholesterolemia, unspecified: Secondary | ICD-10-CM | POA: Diagnosis present

## 2017-03-25 DIAGNOSIS — R5383 Other fatigue: Secondary | ICD-10-CM

## 2017-03-25 DIAGNOSIS — Z79899 Other long term (current) drug therapy: Secondary | ICD-10-CM

## 2017-03-25 DIAGNOSIS — Z807 Family history of other malignant neoplasms of lymphoid, hematopoietic and related tissues: Secondary | ICD-10-CM

## 2017-03-25 DIAGNOSIS — R5081 Fever presenting with conditions classified elsewhere: Secondary | ICD-10-CM | POA: Diagnosis not present

## 2017-03-25 DIAGNOSIS — Z85828 Personal history of other malignant neoplasm of skin: Secondary | ICD-10-CM

## 2017-03-25 DIAGNOSIS — R509 Fever, unspecified: Secondary | ICD-10-CM | POA: Diagnosis not present

## 2017-03-25 DIAGNOSIS — M810 Age-related osteoporosis without current pathological fracture: Secondary | ICD-10-CM | POA: Diagnosis present

## 2017-03-25 DIAGNOSIS — C50411 Malignant neoplasm of upper-outer quadrant of right female breast: Secondary | ICD-10-CM | POA: Diagnosis present

## 2017-03-25 DIAGNOSIS — D709 Neutropenia, unspecified: Secondary | ICD-10-CM | POA: Diagnosis not present

## 2017-03-25 DIAGNOSIS — Z171 Estrogen receptor negative status [ER-]: Secondary | ICD-10-CM

## 2017-03-25 DIAGNOSIS — D638 Anemia in other chronic diseases classified elsewhere: Secondary | ICD-10-CM | POA: Diagnosis present

## 2017-03-25 DIAGNOSIS — E039 Hypothyroidism, unspecified: Secondary | ICD-10-CM | POA: Diagnosis present

## 2017-03-25 LAB — CBC WITH DIFFERENTIAL/PLATELET
Basophils Absolute: 0 10*3/uL (ref 0.0–0.1)
Basophils Relative: 3 %
EOS ABS: 0 10*3/uL (ref 0.0–0.7)
EOS PCT: 0 %
HCT: 28.4 % — ABNORMAL LOW (ref 36.0–46.0)
HEMOGLOBIN: 9.2 g/dL — AB (ref 12.0–15.0)
LYMPHS PCT: 59 %
Lymphs Abs: 0.6 10*3/uL — ABNORMAL LOW (ref 0.7–4.0)
MCH: 28 pg (ref 26.0–34.0)
MCHC: 32.4 g/dL (ref 30.0–36.0)
MCV: 86.6 fL (ref 78.0–100.0)
MONOS PCT: 35 %
Monocytes Absolute: 0.4 10*3/uL (ref 0.1–1.0)
Neutro Abs: 0 10*3/uL — ABNORMAL LOW (ref 1.7–7.7)
Neutrophils Relative %: 3 %
PLATELETS: 239 10*3/uL (ref 150–400)
RBC: 3.28 MIL/uL — ABNORMAL LOW (ref 3.87–5.11)
RDW: 15.6 % — ABNORMAL HIGH (ref 11.5–15.5)
WBC: 1 10*3/uL — CL (ref 4.0–10.5)

## 2017-03-25 LAB — URINALYSIS, ROUTINE W REFLEX MICROSCOPIC
Bilirubin Urine: NEGATIVE
Glucose, UA: NEGATIVE mg/dL
Hgb urine dipstick: NEGATIVE
KETONES UR: NEGATIVE mg/dL
LEUKOCYTES UA: NEGATIVE
NITRITE: NEGATIVE
PH: 7 (ref 5.0–8.0)
PROTEIN: NEGATIVE mg/dL
Specific Gravity, Urine: 1.003 — ABNORMAL LOW (ref 1.005–1.030)

## 2017-03-25 LAB — COMPREHENSIVE METABOLIC PANEL
ALBUMIN: 3.5 g/dL (ref 3.5–5.0)
ALT: 28 U/L (ref 14–54)
AST: 37 U/L (ref 15–41)
Alkaline Phosphatase: 66 U/L (ref 38–126)
Anion gap: 6 (ref 5–15)
BUN: 12 mg/dL (ref 6–20)
CHLORIDE: 102 mmol/L (ref 101–111)
CO2: 26 mmol/L (ref 22–32)
Calcium: 8.8 mg/dL — ABNORMAL LOW (ref 8.9–10.3)
Creatinine, Ser: 0.54 mg/dL (ref 0.44–1.00)
GFR calc Af Amer: >60 mL/min (ref >60)
GFR calc non Af Amer: >60 mL/min (ref >60)
GLUCOSE: 117 mg/dL — AB (ref 65–99)
POTASSIUM: 4.2 mmol/L (ref 3.5–5.1)
SODIUM: 134 mmol/L — AB (ref 135–145)
TOTAL PROTEIN: 6 g/dL — AB (ref 6.5–8.1)
Total Bilirubin: 0.5 mg/dL (ref 0.3–1.2)

## 2017-03-25 LAB — I-STAT CG4 LACTIC ACID, ED

## 2017-03-25 LAB — LIPASE, BLOOD: Lipase: 31 U/L (ref 11–51)

## 2017-03-25 MED ORDER — ACETAMINOPHEN 325 MG PO TABS: 650.0000 mg | ORAL_TABLET | Freq: Four times a day (QID) | ORAL | Status: DC | PRN

## 2017-03-25 MED ORDER — ONDANSETRON HCL 4 MG PO TABS: 4.0000 mg | ORAL_TABLET | Freq: Four times a day (QID) | ORAL | Status: DC | PRN

## 2017-03-25 MED ORDER — ACETAMINOPHEN 650 MG RE SUPP: 650.0000 mg | Freq: Four times a day (QID) | RECTAL | Status: DC | PRN

## 2017-03-25 MED ORDER — NIACIN 100 MG PO TABS
50.0000 mg | ORAL_TABLET | Freq: Every day | ORAL | Status: DC
  Administered 2017-03-25 – 2017-03-26 (×2): 50 mg via ORAL
  Filled 2017-03-25 (×2): qty 1

## 2017-03-25 MED ORDER — ZINC SULFATE 220 (50 ZN) MG PO CAPS
220.0000 mg | ORAL_CAPSULE | Freq: Every day | ORAL | Status: DC
  Administered 2017-03-26: 220 mg via ORAL
  Filled 2017-03-25 (×3): qty 1

## 2017-03-25 MED ORDER — VANCOMYCIN HCL IN DEXTROSE 1-5 GM/200ML-% IV SOLN
1000.0000 mg | Freq: Once | INTRAVENOUS | Status: AC
  Administered 2017-03-25: 1000 mg via INTRAVENOUS
  Filled 2017-03-25: qty 200

## 2017-03-25 MED ORDER — ONDANSETRON HCL 4 MG/2ML IJ SOLN: 4.0000 mg | Freq: Four times a day (QID) | INTRAMUSCULAR | Status: DC | PRN

## 2017-03-25 MED ORDER — ZINC ASPARTATE 40 MG PO TABS: 15.0000 mg | ORAL_TABLET | Freq: Every day | ORAL | Status: DC

## 2017-03-25 MED ORDER — VANCOMYCIN HCL IN DEXTROSE 1-5 GM/200ML-% IV SOLN: 1000.0000 mg | INTRAVENOUS | Status: DC

## 2017-03-25 MED ORDER — PROCHLORPERAZINE MALEATE 10 MG PO TABS: 10.0000 mg | ORAL_TABLET | Freq: Four times a day (QID) | ORAL | Status: DC | PRN

## 2017-03-25 MED ORDER — THYROID 30 MG PO TABS
30.0000 mg | ORAL_TABLET | Freq: Every day | ORAL | Status: DC
  Administered 2017-03-26: 30 mg via ORAL
  Filled 2017-03-25 (×2): qty 1

## 2017-03-25 MED ORDER — VITAMIN C 100 MG PO TABS: 100.0000 mg | ORAL_TABLET | Freq: Two times a day (BID) | ORAL | Status: DC

## 2017-03-25 MED ORDER — PIPERACILLIN-TAZOBACTAM 3.375 G IVPB 30 MIN
3.3750 g | Freq: Once | INTRAVENOUS | Status: AC
  Administered 2017-03-25: 3.375 g via INTRAVENOUS
  Filled 2017-03-25: qty 50

## 2017-03-25 MED ORDER — ALBUTEROL SULFATE (2.5 MG/3ML) 0.083% IN NEBU
2.5000 mg | INHALATION_SOLUTION | RESPIRATORY_TRACT | Status: DC | PRN
Start: 2017-03-25 — End: 2017-03-27

## 2017-03-25 MED ORDER — VITAMIN D3 25 MCG (1000 UNIT) PO TABS
1000.0000 [IU] | ORAL_TABLET | Freq: Every day | ORAL | Status: DC
  Administered 2017-03-26: 1000 [IU] via ORAL
  Filled 2017-03-25: qty 1

## 2017-03-25 MED ORDER — NIACIN 50 MG PO TABS: 50.0000 mg | ORAL_TABLET | Freq: Every day | ORAL | Status: DC

## 2017-03-25 MED ORDER — CO-ENZYME Q-10 50 MG PO CAPS: 50.0000 mg | ORAL_CAPSULE | Freq: Every day | ORAL | Status: DC

## 2017-03-25 MED ORDER — PIPERACILLIN-TAZOBACTAM 3.375 G IVPB
3.3750 g | Freq: Three times a day (TID) | INTRAVENOUS | Status: DC
  Administered 2017-03-25 – 2017-03-27 (×5): 3.375 g via INTRAVENOUS
  Filled 2017-03-25 (×6): qty 50

## 2017-03-25 MED ORDER — VITAMIN E 45 MG (100 UNIT) PO CAPS
200.0000 [IU] | ORAL_CAPSULE | Freq: Two times a day (BID) | ORAL | Status: DC
  Administered 2017-03-25 – 2017-03-26 (×3): 200 [IU] via ORAL
  Filled 2017-03-25 (×5): qty 2

## 2017-03-25 MED ORDER — ENOXAPARIN SODIUM 40 MG/0.4ML ~~LOC~~ SOLN
40.0000 mg | Freq: Every day | SUBCUTANEOUS | Status: DC
  Filled 2017-03-25: qty 0.4

## 2017-03-25 MED ORDER — SODIUM CHLORIDE 0.9 % IV SOLN
INTRAVENOUS | Status: AC
  Administered 2017-03-25 (×2): via INTRAVENOUS

## 2017-03-25 MED ORDER — VITAMIN C 500 MG PO TABS
250.0000 mg | ORAL_TABLET | Freq: Every day | ORAL | Status: DC
  Administered 2017-03-26: 250 mg via ORAL
  Filled 2017-03-25: qty 1

## 2017-03-25 NOTE — ED Notes (Signed)
Patient transported to X-ray 

## 2017-03-25 NOTE — Progress Notes (Signed)
PHARMACIST - PHYSICIAN ORDER COMMUNICATION ° °CONCERNING: P&T Medication Policy on Herbal Medications ° °DESCRIPTION:  This patient’s order for:  CoEnzyme Q10  has been noted. ° °This product(s) is classified as an “herbal” or natural product. °Due to a lack of definitive safety studies or FDA approval, nonstandard manufacturing practices, plus the potential risk of unknown drug-drug interactions while on inpatient medications, the Pharmacy and Therapeutics Committee does not permit the use of “herbal” or natural products of this type within [facility]. °  °ACTION TAKEN: °The pharmacy department is unable to verify this order at this time and your patient has been informed of this safety policy. °Please reevaluate patient’s clinical condition at discharge and address if the herbal or natural product(s) should be resumed at that time.  ° °Malyna Budney, PharmD °

## 2017-03-25 NOTE — Progress Notes (Signed)
Paula [name]   DOB:02/24/1956   MW#:[id number]   YQI#:[id number]  Oncology follow-up note  Subjective: Patient is well-known to me, under my care for her breast cancer. She is on adjuvant chemotherapy, last chemotherapy on 5/23. She presents to the ED with fever and fatigue. She is being admitted for neutropenic fever. No productive cough, abdominal discomfort, dysuria or diarrhea.   Objective:  Vitals:   03/25/17 1844 03/25/17 1920  BP: 99/65 97/62  Pulse: 98 (!) 102  Resp: 16 16  Temp:  99.8 F (37.7 C)    Body mass index is 19 kg/m.  Intake/Output Summary (Last 24 hours) at 03/25/17 2127 Last data filed at 03/25/17 1705  Gross per 24 hour  Intake              200 ml  Output                0 ml  Net              200 ml     Sclerae unicteric  Oropharynx clear  No peripheral adenopathy  Lungs clear -- no rales or rhonchi  Heart regular rate and rhythm  Abdomen benign  MSK no focal spinal tenderness, no peripheral edema  Neuro nonfocal    CBG (last 3)  No results for input(s): GLUCAP in the last 72 hours.   Labs:  Lab Results  Component Value Date   WBC 1.0 (LL) 03/25/2017   HGB 9.2 (L) 03/25/2017   HCT 28.4 (L) 03/25/2017   MCV 86.6 03/25/2017   PLT 239 03/25/2017   NEUTROABS 0.0 (L) 03/25/2017    Urine Studies No results for input(s): UHGB, CRYS in the last 72 hours.  Invalid input(s): UACOL, UAPR, USPG, UPH, UTP, UGL, UKET, UBIL, UNIT, UROB, ULEU, UEPI, UWBC, URBC, UBAC, CAST, UCOM, BILUA  Basic Metabolic Panel:  Recent Labs Lab 03/25/17 1321  NA 134*  K 4.2  CL 102  CO2 26  GLUCOSE 117*  BUN 12  CREATININE 0.54  CALCIUM 8.8*   GFR Estimated Creatinine Clearance: 60 mL/min (by C-G formula based on SCr of 0.54 mg/dL). Liver Function Tests:  Recent Labs Lab 03/25/17 1321  AST 37  ALT 28  ALKPHOS 66  BILITOT 0.5  PROT 6.0*  ALBUMIN 3.5    Recent Labs Lab 03/25/17 1321  LIPASE 31   No results for input(s): AMMONIA in the last 168  hours. Coagulation profile No results for input(s): INR, PROTIME in the last 168 hours.  CBC:  Recent Labs Lab 03/25/17 1321  WBC 1.0*  NEUTROABS 0.0*  HGB 9.2*  HCT 28.4*  MCV 86.6  PLT 239   Cardiac Enzymes: No results for input(s): CKTOTAL, CKMB, CKMBINDEX, TROPONINI in the last 168 hours. BNP: Invalid input(s): POCBNP CBG: No results for input(s): GLUCAP in the last 168 hours. D-Dimer No results for input(s): DDIMER in the last 72 hours. Hgb A1c No results for input(s): HGBA1C in the last 72 hours. Lipid Profile No results for input(s): CHOL, HDL, LDLCALC, TRIG, CHOLHDL, LDLDIRECT in the last 72 hours. Thyroid function studies No results for input(s): TSH, T4TOTAL, T3FREE, THYROIDAB in the last 72 hours.  Invalid input(s): FREET3 Anemia work up No results for input(s): VITAMINB12, FOLATE, FERRITIN, TIBC, IRON, RETICCTPCT in the last 72 hours. Microbiology No results found for this or any previous visit (from the past 240 hour(s)).    Studies:  Dg Chest 2 View  Result Date: 03/25/2017 CLINICAL DATA:  Chemotherapy for breast  cancer. Intermittent fever beginning yesterday. EXAM: CHEST  2 VIEW COMPARISON:  None. FINDINGS: Heart size is normal. There is aortic atherosclerosis. Mediastinal shadows are otherwise normal. Insignificant azygos fissure. The lungs are clear. No infiltrate, collapse or effusion. Minimal spinal curvature. No significant bone finding. IMPRESSION: No active disease.  No cause of fever identified. Electronically Signed   By: Nelson Chimes M.D.   On: 03/25/2017 14:01    Assessment: 61 y.o. 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 Neutropenic fever 2. Stage IA triple negative right breast cancer, currently on adjuvant chemotherapy docetaxel and Cytoxan, last cycle on 03/12/2017, patient declined Neulasta after chemo  3. Anemia secondary to chemotherapy   Plan:  -Agree with hospital admission and IV  antibiotics, she does not have port, no signs of skin infection, I think Zosyn is probably adequate. -ID workup, including blood any urine cultures. Her chest x-ray was negative today -I recommend G-CSF Granix daily for her neutropenia, pt declined for tonight, concerns about "growth factor cause cancer and lymphoma" despite I explained to her that there is no scientific evidence for the statement. She may think about again tomorrow if her [place] does not improve  -please keep her in for at least 48 hours for culture results before discharge  -I will follow up  -she has one more cycle chemo which is scheduled for 6/6, if her counts recover well.    Truitt Merle, MD 03/25/2017  9:27 PM

## 2017-03-25 NOTE — ED Triage Notes (Signed)
Pt reports intermittent fever since yesterday. Pt on chemo for breast ca. Last treatment 13 days ago. Has also had a slight cough.

## 2017-03-25 NOTE — H&P (Signed)
TRH H&P   Patient Demographics:    Paula [name], is a 61 y.o. female  MRN: [id number]   DOB - [birth year]  Admit Date - 03/25/2017  Outpatient Primary MD for the patient is Hayden Rasmussen, MD  Outpatient Specialists: Dr Burr Medico    Patient coming from: Home  Chief Complaint  Patient presents with  . Fever      HPI:    Paula [name]  is a 61 y.o. 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  Patient got her last chemotherapy on 03/12/2017, she was offered Neulasta by the oncology team but she refused, she comes in with one-day history of fevers at home, not associated with chills, minimal dry cough but no other complaints, no headache, no chest pain, no abdominal pain or diarrhea, no new joint pains or aches or skin rashes or bruises. Came to the ER where her white count was 1, chest x-ray was  unremarkable, blood cultures were drawn, she was placed on empiric IV antibiotics and I was called to admit.    Review of systems:    In addition to the HPI above,   +ve Fever-chills, No Headache, No changes with Vision or hearing, No problems swallowing food or Liquids, No Chest pain, Cough or Shortness of Breath, No Abdominal pain, No Nausea or Vommitting, Bowel movements are regular, No Blood in stool or Urine, No dysuria, No new skin rashes or bruises, No new joints pains-aches,  No new weakness, tingling, numbness in any extremity, No recent weight gain or loss, No polyuria, polydypsia or  polyphagia, No significant Mental Stressors.  A full 10 point Review of Systems was done, except as stated above, all other Review of Systems were negative.   With Past History of the following :    Past Medical History:  Diagnosis Date  . Candida infection    in bowels  . 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 [name] infection   . Hypercholesteremia   . Hypothyroid   . Osteoporosis   . Skin cancer    right side of face basal cell      Past Surgical History:  Procedure Laterality Date  . basal cell skin cancer     to right side of face two times  . BREAST SURGERY     right breast biopsy  . TONSILLECTOMY     as a child      Social History:     Social History  Substance Use Topics  . Smoking status: Former Smoker    Packs/day: 0.25    Years: 2.00    Quit date: 03/30/1976  . Smokeless tobacco: Never Used     Comment: she smoked socially as a teenager  . Alcohol use Yes     Comment: rare wine use         Family History :     Family History  Problem Relation Age of Onset  . Cancer Mother 2       breast  . Cancer Father        pancreatic  . Cancer Sister 32       breast  . Heart disease Maternal Grandfather   . Cancer Paternal Grandmother 50       lymphoma  . Cancer Paternal Grandfather 22       stomach  . Cancer Maternal  Grandmother 95       breast cancer   . Cancer Sister 53       breast cancer        Home Medications:   Prior to Admission medications   Medication Sig Start Date End Date Taking? Authorizing Provider  ARMOUR THYROID 30 MG tablet 1 1/2 TABLET EVERY DAY 12/13/16  Yes [provider]  Ascorbic Acid (VITAMIN C) 100 MG tablet Take 100 mg by mouth 2 (two) times daily.   Yes [provider]  cholecalciferol (VITAMIN D) 1000 UNITS tablet Take 1,000 Units by mouth daily.   Yes [provider]  co-enzyme Q-10 50 MG capsule Take 50 mg by mouth.   Yes [provider]  Digestive Enzymes (BETAINE HCL) 650-130 MG CAPS Take 1 capsule by mouth 3 (three) times daily with meals. Betaine HCL [place] [facility]   Yes [provider]  Garlic 10 MG CAPS Take by mouth. Dosing unknown   Yes [provider]  GLUTAMINE PO Take by mouth. For leaky gut   Yes [provider]  Homeopathic Products (FRANKINCENSE UPLIFTING) OIL Inhale into the lungs.   Yes [provider]  IODINE, KELP, PO Take by mouth. ? Dose 6mg ?   Yes [provider]  MAGNESIUM CITRATE PO Take 200 mg by mouth 2 (two) times daily.   Yes [provider]  Melatonin 3-2 MG TABS Take by mouth every evening. Actual dose is 3-6mg    Yes [provider]  niacin 50 MG tablet Take 50 mg by mouth at bedtime.   Yes [provider]  OIL OF [place]ANO PO Take by mouth 3 (three)  times daily with meals.   Yes [provider]  ondansetron (ZOFRAN) 8 MG tablet Take 1 tablet (8 mg total) by mouth 2 (two) times daily as needed for refractory nausea / vomiting. Start on day 3 after chemo. 01/14/17  Yes Truitt Merle, MD  prochlorperazine (COMPAZINE) 10 MG tablet Take 1 tablet (10 mg total) by mouth every 6 (six) hours as needed (Nausea or vomiting). 01/14/17  Yes Truitt Merle, MD  Selenium (V-R SELENIUM) 200 MCG TABS Take by mouth.   Yes [provider]  TURMERIC PO Take 250 mg by mouth daily.   Yes [provider]  vitamin E (VITAMIN E) 200 UNIT capsule Take 200 Units by mouth 2 (two) times daily.   Yes [provider]  VITAMIN K PO Take 2 mg by mouth daily.   Yes [provider]  ZINC ASPARTATE PO Take 15 mg by mouth daily. For leaky gut   Yes [provider]     Allergies:    No Known Allergies   Physical Exam:   Vitals  Blood pressure 126/66, pulse 94, temperature 99.6 F (37.6 C), temperature source Oral, resp. rate 14, height 5\' 4"  (1.626 m), weight 49 kg (108 lb), SpO2 98 %.   1. General thin middle aged white female lying in bed in NAD,    2. Normal affect and insight, Not Suicidal or Homicidal, Awake Alert, Oriented X 3.  3. No F.N deficits, ALL C.Nerves Intact, Strength 5/5 all 4 extremities, Sensation intact all 4 extremities, Plantars down going.  4. Ears and Eyes appear Normal, Conjunctivae clear, PERRLA. Moist Oral Mucosa.  5. Supple Neck, No JVD, No cervical lymphadenopathy appriciated, No Carotid Bruits.  6. Symmetrical Chest wall movement, Good air movement bilaterally, CTAB.   7. RRR, No Gallops, Rubs or Murmurs, No Parasternal Heave.  8. Positive Bowel Sounds, Abdomen Soft, No tenderness, No organomegaly appriciated,No rebound -guarding or rigidity.  9.  No Cyanosis, Normal Skin Turgor, No Skin Rash or Bruise.  10. Good muscle tone,  joints appear normal , no effusions, Normal ROM.  11. No Palpable Lymph Nodes in Neck or Axillae      Data Review:    CBC  Recent Labs Lab 03/25/17 1321  WBC 1.0*  HGB 9.2*  HCT 28.4*  PLT 239  MCV 86.6  MCH 28.0  MCHC 32.4  RDW 15.6*  LYMPHSABS 0.6*  MONOABS 0.4  EOSABS 0.0  BASOSABS 0.0   ------------------------------------------------------------------------------------------------------------------  Chemistries   Recent Labs Lab 03/25/17 1321  NA 134*  K 4.2  CL 102  CO2 26  GLUCOSE 117*  BUN 12    CREATININE 0.54  CALCIUM 8.8*  AST 37  ALT 28  ALKPHOS 66  BILITOT 0.5   ------------------------------------------------------------------------------------------------------------------ estimated creatinine clearance is 58.6 mL/min (by C-G formula based on SCr of 0.54 mg/dL). ------------------------------------------------------------------------------------------------------------------ No results for input(s): TSH, T4TOTAL, T3FREE, THYROIDAB in the last 72 hours.  Invalid input(s): FREET3  Coagulation profile No results for input(s): INR, PROTIME in the last 168 hours. ------------------------------------------------------------------------------------------------------------------- No results for input(s): DDIMER in the last 72 hours. -------------------------------------------------------------------------------------------------------------------  Cardiac Enzymes No results for input(s): CKMB, TROPONINI, MYOGLOBIN in the last 168 hours.  Invalid input(s): CK ------------------------------------------------------------------------------------------------------------------ No results found for: BNP   ---------------------------------------------------------------------------------------------------------------  Urinalysis No results found for: COLORURINE, APPEARANCEUR, [place], [place], GLUCOSEU, [place], BILIRUBINUR, KETONESUR, PROTEINUR, UROBILINOGEN, NITRITE, LEUKOCYTESUR  ----------------------------------------------------------------------------------------------------------------   Imaging Results:    Dg Chest 2 View  Result Date: 03/25/2017 CLINICAL DATA:  Chemotherapy  for breast cancer. Intermittent fever beginning yesterday. EXAM: CHEST  2 VIEW COMPARISON:  None. FINDINGS: Heart size is normal. There is aortic atherosclerosis. Mediastinal shadows are otherwise normal. Insignificant azygos fissure. The lungs are clear. No infiltrate, collapse or effusion. Minimal  spinal curvature. No significant bone finding. IMPRESSION: No active disease.  No cause of fever identified. Electronically Signed   By: Nelson Chimes M.D.   On: 03/25/2017 14:01       Assessment & Plan:      1. Febrile neutropenia in a patient with history of breast cancer who is on chemotherapy with last treatment on the 16th of this month, she does not want Lunesta.  Patient will be kept in observation for one day, blood cultures have been drawn, chest x-ray is unremarkable, UA is pending but her history and exam are benign. She is nontoxic appearing. At this time I think she can be managed on IV Zosyn for 24 hours with cultures to be followed, if she remains stable likely can be discharged on oral Cipro or Levaquin. I have requested her oncologist Dr. Burr Medico to see her later today.   2. History of Giardia, tapeworm infection, EBV chronic infection. Follow with PCP. She does not want conventional treatment and is currently following with a veterinary physician for these issues.  3. Mild anemia of chronic disease. No acute issues monitor.   DVT Prophylaxis  Lovenox    AM Labs Ordered, also please review Full Orders  Family Communication: Admission, patients condition and plan of care including tests being ordered have been discussed with the patient and husband who indicate understanding and agree with the plan and Code Status.  Code Status Full  Likely DC to  Home  Condition Fair  Consults called: Oncology    Admission status: Obs    Time spent in minutes : 35   Lala Lund M.D on 03/25/2017 at 3:59 PM  Between 7am to 7pm - Pager - [phone] ( page via [facility], text pages only, please mention full 10 digit call back number).  After 7pm go to [email or web address] - password [facility]  [facility] - Office  [phone]

## 2017-03-25 NOTE — ED Provider Notes (Signed)
Emergency Department Provider Note   I have reviewed the triage vital signs and the nursing notes.   HISTORY  Chief Complaint Fever   HPI Paula [name] is a 61 y.o. female with PMH of breast cancer on chemotherapy with last treatment 13 days ago presents to the emergency department for evaluation of fatigue, fever last night, and mild cough. Patient states that symptoms were mild. She took her temperature last night after feeling feverish over the weekend. She got a reading of 102F . She continued to feel badly this morning with mild cough and so presented to the emergency department. No fever this morning. No nausea, vomiting, diarrhea. No abdominal pain.    Past Medical History:  Diagnosis Date  . Candida infection    in bowels  . 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 [name] infection   . Hypercholesteremia   . Hypothyroid   . Osteoporosis   . Skin cancer    right side of face basal cell    Patient Active Problem List   Diagnosis Date Noted  . Neutropenia with fever ([place]) 03/25/2017  . Febrile neutropenia ([place]) 03/25/2017  . Breast cancer of upper-outer quadrant of right female breast ([place]) 01/08/2017  . Menopause 04/03/2014  . Basal cell carcinoma of skin 03/30/2014  . Hyperlipidemia 03/30/2014  . Neck pain 03/30/2014  . Colon polyp 03/30/2014  . Family history of breast cancer in first degree relative  MGM, mother and sister 03/30/2014  . Fibrocystic breast disease 03/30/2014  . Osteoporosis  last Dexa  02/2012 03/30/2014    Past Surgical History:  Procedure Laterality Date  . basal cell skin cancer     to right side of face two times  . BREAST SURGERY     right breast biopsy  . TONSILLECTOMY     as a child      Allergies Patient has no known allergies.  Family History  Problem Relation Age of Onset  . Cancer Mother 55       breast  . Cancer Father        pancreatic  . Cancer Sister 88       breast  . Heart disease  Maternal Grandfather   . Cancer Paternal Grandmother 78       lymphoma  . Cancer Paternal Grandfather 107       stomach  . Cancer Maternal Grandmother 95       breast cancer   . Cancer Sister 91       breast cancer     Social History Social History  Substance Use Topics  . Smoking status: Former Smoker    Packs/day: 0.25    Years: 2.00    Quit date: 03/30/1976  . Smokeless tobacco: Never Used     Comment: she smoked socially as a teenager  . Alcohol use Yes     Comment: rare wine use    Review of Systems  Constitutional: Positive fever/chills Eyes: No visual changes. ENT: No sore throat. Cardiovascular: Denies chest pain. Respiratory: Denies shortness of breath. Positive cough.  Gastrointestinal: No abdominal pain.  No nausea, no vomiting.  No diarrhea.  No constipation. Genitourinary: Negative for dysuria. Musculoskeletal: Negative for back pain. Skin: Negative for rash. Neurological: Negative for headaches, focal weakness or numbness.  10-point ROS otherwise negative.  ____________________________________________   PHYSICAL EXAM:  VITAL SIGNS: ED Triage Vitals  Enc Vitals Group     BP 03/25/17 1230 94/62  Pulse Rate 03/25/17 1230 (!) 109     Resp 03/25/17 1230 16     Temp 03/25/17 1230 99.4 F (37.4 C)     Temp Source 03/25/17 1230 Oral     SpO2 03/25/17 1230 97 %     Weight 03/25/17 1234 108 lb (49 kg)     Height 03/25/17 1243 5\' 4"  (1.626 m)     Pain Score 03/25/17 1233 1    Constitutional: Alert and oriented. Well appearing and in no acute distress. Eyes: Conjunctivae are normal.  Head: Atraumatic. Nose: No congestion/rhinnorhea. Mouth/Throat: Mucous membranes are moist.  Oropharynx non-erythematous. Neck: No stridor.  Cardiovascular: Normal rate, regular rhythm. Good peripheral circulation. Grossly normal heart sounds.   Respiratory: Normal respiratory effort.  No retractions. Lungs CTAB. Gastrointestinal: Soft and nontender. No distention.    Musculoskeletal: No lower extremity tenderness nor edema. No gross deformities of extremities. Neurologic:  Normal speech and language. No gross focal neurologic deficits are appreciated.  Skin:  Skin is warm, dry and intact. No rash noted.  ____________________________________________   LABS (all labs ordered are listed, but only abnormal results are displayed)  Labs Reviewed  COMPREHENSIVE METABOLIC PANEL - Abnormal; Notable for the following:       Result Value   Sodium 134 (*)    Glucose, Bld 117 (*)    Calcium 8.8 (*)    Total Protein 6.0 (*)    All other components within normal limits  CBC WITH DIFFERENTIAL/PLATELET - Abnormal; Notable for the following:    WBC 1.0 (*)    RBC 3.28 (*)    Hemoglobin 9.2 (*)    HCT 28.4 (*)    RDW 15.6 (*)    Neutro Abs 0.0 (*)    Lymphs Abs 0.6 (*)    All other components within normal limits  URINALYSIS, ROUTINE W REFLEX MICROSCOPIC - Abnormal; Notable for the following:    Color, Urine STRAW (*)    Specific Gravity, Urine 1.003 (*)    All other components within normal limits  I-STAT CG4 LACTIC ACID, ED - Abnormal; Notable for the following:    Lactic Acid, Venous <0.30 (*)    All other components within normal limits  CULTURE, BLOOD (ROUTINE X 2)  CULTURE, BLOOD (ROUTINE X 2)  URINE CULTURE  LIPASE, BLOOD  CREATININE, SERUM  I-STAT CG4 LACTIC ACID, ED   ____________________________________________  RADIOLOGY  Dg Chest 2 View  Result Date: 03/25/2017 CLINICAL DATA:  Chemotherapy for breast cancer. Intermittent fever beginning yesterday. EXAM: CHEST  2 VIEW COMPARISON:  None. FINDINGS: Heart size is normal. There is aortic atherosclerosis. Mediastinal shadows are otherwise normal. Insignificant azygos fissure. The lungs are clear. No infiltrate, collapse or effusion. Minimal spinal curvature. No significant bone finding. IMPRESSION: No active disease.  No cause of fever identified. Electronically Signed   By: Nelson Chimes M.D.    On: 03/25/2017 14:01    ____________________________________________   PROCEDURES  Procedure(s) performed:   Procedures  None ____________________________________________   INITIAL IMPRESSION / ASSESSMENT AND PLAN / ED COURSE  Pertinent labs & imaging results that were available during my care of the patient were reviewed by me and considered in my medical decision making (see chart for details).  Patient resents to the emergency department for evaluation of fever, cough, in the setting of chemotherapy and active breast cancer. The patient is awake, alert, not hypoxemic. Plan for chest x-ray, labs, reassessment.  03:10 PM I had a Elenna Spratling discussion with the patient regarding her  blood work. Her white blood cell count is 1. ANC is 0.0. No fever here but did have a fever last night of 102F along with symptoms of fever. There is some discussion about if the thermometer at home is correct but patient seems to have symptoms of infection in addition to the reading at home. I have concern for neutropenic fever. Patient came in tachycardic as well. I have covered with broad-spectrum antibiotics, blood cultures, and will place on neutropenic precautions. Patient is very resistant to staying in the hospital and/or initiating antibiotic treatment. We had a Ceci Taliaferro discussion about the risks and benefits of this. I offered to call and discuss with Dr. Burr Medico immediately but patient declined. Plan for hospitalist admission.   Discussed patient's case with hospitalist, Dr. Candiss Norse. Patient and family (if present) updated with plan. Care transferred to hospitalist service.  I reviewed all nursing notes, vitals, pertinent old records, EKGs, labs, imaging (as available).  ____________________________________________  FINAL CLINICAL IMPRESSION(S) / ED DIAGNOSES  Final diagnoses:  Neutropenic fever ([place])     MEDICATIONS GIVEN DURING THIS VISIT:  Medications  piperacillin-tazobactam (ZOSYN) IVPB 3.375 g  (not administered)  0.9 %  sodium chloride infusion ( Intravenous New Bag/Given 03/25/17 1709)  thyroid (ARMOUR) tablet 30 mg (not administered)  cholecalciferol (VITAMIN D) tablet 1,000 Units (not administered)  prochlorperazine (COMPAZINE) tablet 10 mg (not administered)  vitamin E capsule 200 Units (not administered)  niacin tablet 50 mg (not administered)  vitamin C (ASCORBIC ACID) tablet 250 mg (not administered)  zinc sulfate capsule 220 mg (not administered)  piperacillin-tazobactam (ZOSYN) IVPB 3.375 g (0 g Intravenous Stopped 03/25/17 1556)  vancomycin (VANCOCIN) IVPB 1000 mg/200 mL premix (0 mg Intravenous Stopped 03/25/17 1705)     NEW OUTPATIENT MEDICATIONS STARTED DURING THIS VISIT:  None   Note:  This document was prepared using Dragon voice recognition software and may include unintentional dictation errors.  Nanda Quinton, MD Emergency Medicine  Reinhard Schack, [facility], MD 03/25/17 Joen Laura

## 2017-03-25 NOTE — ED Notes (Signed)
Bed: WA08 Expected date:  Expected time:  Means of arrival:  Comments: Triage 2

## 2017-03-25 NOTE — Progress Notes (Signed)
Pharmacy Antibiotic Note  Paula [name] is a 61 y.o. female admitted on 03/25/2017 with sepsis.  Presents to ED with complaints of fever and cough.  PMH significant for breast cancer and undergoing chemotherapy.  In the ED, Vancomycin 1gm and Zosyn 3.375gm x 1 dose each ordered.  Pharmacy has been consulted for Vancomycin and Zosyn dosing.  Plan:  Vancomycin 1gm IV q24h  Zosyn 3.375gm IV q8h (each dose infused over 4 hrs)  Follow daily SCr  Follow culture results/sensitivities  Height: 5\' 4"  (162.6 cm) Weight: 108 lb (49 kg) IBW/kg (Calculated) : 54.7  Temp (24hrs), Avg:99.6 F (37.6 C), Min:99.4 F (37.4 C), Max:99.8 F (37.7 C)   Recent Labs Lab 03/25/17 1321  WBC 1.0*  CREATININE 0.54    Estimated Creatinine Clearance: 58.6 mL/min (by C-G formula based on SCr of 0.54 mg/dL).    No Known Allergies  Antimicrobials this admission: 5/29 vanc >>   5/29 zosyn >>    Dose adjustments this admission:    Microbiology results: 5/29 BCx: sent  Thank you for allowing pharmacy to be a part of this patient's care.  Everette Rank, PharmD 03/25/2017 2:54 PM

## 2017-03-25 NOTE — Telephone Encounter (Signed)
Pt called at 1029 stating she had a slight fever and wanted directions.  At 1321 when called her back noted she was currently admitted at [facility] ED.

## 2017-03-26 DIAGNOSIS — E039 Hypothyroidism, unspecified: Secondary | ICD-10-CM | POA: Diagnosis present

## 2017-03-26 DIAGNOSIS — Z803 Family history of malignant neoplasm of breast: Secondary | ICD-10-CM | POA: Diagnosis not present

## 2017-03-26 DIAGNOSIS — R509 Fever, unspecified: Secondary | ICD-10-CM | POA: Diagnosis present

## 2017-03-26 DIAGNOSIS — M810 Age-related osteoporosis without current pathological fracture: Secondary | ICD-10-CM | POA: Diagnosis present

## 2017-03-26 DIAGNOSIS — Z87891 Personal history of nicotine dependence: Secondary | ICD-10-CM | POA: Diagnosis not present

## 2017-03-26 DIAGNOSIS — Z85828 Personal history of other malignant neoplasm of skin: Secondary | ICD-10-CM | POA: Diagnosis not present

## 2017-03-26 DIAGNOSIS — Z171 Estrogen receptor negative status [ER-]: Secondary | ICD-10-CM | POA: Diagnosis not present

## 2017-03-26 DIAGNOSIS — E785 Hyperlipidemia, unspecified: Secondary | ICD-10-CM | POA: Diagnosis present

## 2017-03-26 DIAGNOSIS — Z8249 Family history of ischemic heart disease and other diseases of the circulatory system: Secondary | ICD-10-CM | POA: Diagnosis not present

## 2017-03-26 DIAGNOSIS — D709 Neutropenia, unspecified: Secondary | ICD-10-CM | POA: Diagnosis present

## 2017-03-26 DIAGNOSIS — I700 Atherosclerosis of aorta: Secondary | ICD-10-CM | POA: Diagnosis present

## 2017-03-26 DIAGNOSIS — R5383 Other fatigue: Secondary | ICD-10-CM | POA: Diagnosis not present

## 2017-03-26 DIAGNOSIS — B719 Cestode infection, unspecified: Secondary | ICD-10-CM | POA: Diagnosis present

## 2017-03-26 DIAGNOSIS — R05 Cough: Secondary | ICD-10-CM | POA: Diagnosis not present

## 2017-03-26 DIAGNOSIS — E7800 Pure hypercholesterolemia, unspecified: Secondary | ICD-10-CM | POA: Diagnosis present

## 2017-03-26 DIAGNOSIS — Z807 Family history of other malignant neoplasms of lymphoid, hematopoietic and related tissues: Secondary | ICD-10-CM | POA: Diagnosis not present

## 2017-03-26 DIAGNOSIS — D638 Anemia in other chronic diseases classified elsewhere: Secondary | ICD-10-CM | POA: Diagnosis present

## 2017-03-26 DIAGNOSIS — T451X5A Adverse effect of antineoplastic and immunosuppressive drugs, initial encounter: Secondary | ICD-10-CM | POA: Diagnosis present

## 2017-03-26 DIAGNOSIS — C50919 Malignant neoplasm of unspecified site of unspecified female breast: Secondary | ICD-10-CM | POA: Diagnosis not present

## 2017-03-26 DIAGNOSIS — Z79899 Other long term (current) drug therapy: Secondary | ICD-10-CM | POA: Diagnosis not present

## 2017-03-26 DIAGNOSIS — D6481 Anemia due to antineoplastic chemotherapy: Secondary | ICD-10-CM | POA: Diagnosis present

## 2017-03-26 DIAGNOSIS — R5081 Fever presenting with conditions classified elsewhere: Secondary | ICD-10-CM | POA: Diagnosis present

## 2017-03-26 DIAGNOSIS — C50411 Malignant neoplasm of upper-outer quadrant of right female breast: Secondary | ICD-10-CM | POA: Diagnosis present

## 2017-03-26 LAB — CBC WITH DIFFERENTIAL/PLATELET
BASOS ABS: 0 10*3/uL (ref 0.0–0.1)
BASOS PCT: 3 %
EOS ABS: 0 10*3/uL (ref 0.0–0.7)
Eosinophils Relative: 0 %
HCT: 26.9 % — ABNORMAL LOW (ref 36.0–46.0)
Hemoglobin: 9 g/dL — ABNORMAL LOW (ref 12.0–15.0)
LYMPHS PCT: 54 %
Lymphs Abs: 0.8 10*3/uL (ref 0.7–4.0)
MCH: 29.3 pg (ref 26.0–34.0)
MCHC: 33.5 g/dL (ref 30.0–36.0)
MCV: 87.6 fL (ref 78.0–100.0)
MONO ABS: 0.6 10*3/uL (ref 0.1–1.0)
Monocytes Relative: 40 %
NEUTROS ABS: 0 10*3/uL — AB (ref 1.7–7.7)
NEUTROS PCT: 3 %
PLATELETS: 213 10*3/uL (ref 150–400)
RBC: 3.07 MIL/uL — ABNORMAL LOW (ref 3.87–5.11)
RDW: 16.3 % — AB (ref 11.5–15.5)
WBC: 1.4 10*3/uL — CL (ref 4.0–10.5)

## 2017-03-26 LAB — COMPREHENSIVE METABOLIC PANEL
ALK PHOS: 56 U/L (ref 38–126)
ALT: 30 U/L (ref 14–54)
AST: 38 U/L (ref 15–41)
Albumin: 3.1 g/dL — ABNORMAL LOW (ref 3.5–5.0)
Anion gap: 6 (ref 5–15)
BILIRUBIN TOTAL: 0.5 mg/dL (ref 0.3–1.2)
BUN: 10 mg/dL (ref 6–20)
CALCIUM: 8.3 mg/dL — AB (ref 8.9–10.3)
CO2: 22 mmol/L (ref 22–32)
CREATININE: 0.57 mg/dL (ref 0.44–1.00)
Chloride: 108 mmol/L (ref 101–111)
GFR calc non Af Amer: >60 mL/min (ref >60)
GLUCOSE: 93 mg/dL (ref 65–99)
Potassium: 4.2 mmol/L (ref 3.5–5.1)
SODIUM: 136 mmol/L (ref 135–145)
TOTAL PROTEIN: 5.3 g/dL — AB (ref 6.5–8.1)

## 2017-03-26 LAB — URINE CULTURE: Culture: NO GROWTH

## 2017-03-26 LAB — HIV ANTIBODY (ROUTINE TESTING W REFLEX): HIV Screen 4th Generation wRfx: NONREACTIVE

## 2017-03-26 MED ORDER — SODIUM CHLORIDE 0.9 % IV SOLN
INTRAVENOUS | Status: DC
  Administered 2017-03-26: 23:00:00 via INTRAVENOUS

## 2017-03-26 MED ORDER — SODIUM CHLORIDE 0.9 % IV BOLUS (SEPSIS)
500.0000 mL | Freq: Once | INTRAVENOUS | Status: AC
  Administered 2017-03-26: 500 mL via INTRAVENOUS

## 2017-03-26 MED ORDER — TBO-FILGRASTIM 300 MCG/0.5ML ~~LOC~~ SOSY
300.0000 ug | PREFILLED_SYRINGE | Freq: Every day | SUBCUTANEOUS | Status: AC
  Administered 2017-03-26: 300 ug via SUBCUTANEOUS
  Filled 2017-03-26: qty 0.5

## 2017-03-26 NOTE — Progress Notes (Addendum)
PROGRESS NOTE  Paula [name] LPF:[id number] DOB: 1955-12-26 DOA: 03/25/2017 PCP: Hayden Rasmussen, MD  HPI/Recap of past 24 hours:  tmax 99.9, report has some mild cough since Sunday, no diarrhea, no pain She want to go home, but now agreed to stay and have the gcsf injection  Assessment/Plan: Principal Problem:   Neutropenia with fever (HCC) Active Problems:   Hyperlipidemia   Breast cancer of upper-outer quadrant of right female breast (HCC)   Febrile neutropenia (HCC)  1. Febrile neutropenia in a patient with history of breast cancer who is on chemotherapy with last treatment on the 16th of this month, she declined Lunesta after her recent chemo  blood cultures have been drawn, chest x-ray and ua  is unremarkable, She is nontoxic appearing.  Continue IV Zosyn which is started in the ED, neurophil 0, start neutropenia precaution, continue ivf for now Oncology Dr Feng consulted, patient has finally agreed to gcsf injection which is ordered to start from today Consider discharged on oral Cipro/flagyl once neutrophil  Count imporved and all culture no growth.  2.  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 Mild anemia of chronic disease. No acute issues monitor.  4.  History of Giardia, tapeworm infection, EBV chronic infection. Follow with PCP. She does not want conventional treatment and is currently following with a veterinary physician for these issues. she declined ID referral    DVT Prophylaxis  Lovenox     Family Communication: patient  Code Status Full   Disposition Plan: home once neutrophil counts improves with oncology clearance    Consultants:  oncology  Procedures:  none  Antibiotics:  zosyn   Objective: BP 99/72 (BP Location: Left Arm)   Pulse (!) 113   Temp 99.1 F (37.3 C)  (Oral)   Resp 16   Ht 5\' 4" (1.626 m)   Wt 50.2 kg (110 lb 10.7 oz)   SpO2 100%   BMI 19.00 kg/m   Intake/Output Summary (Last 24 hours) at 03/26/17 0750 Last data filed at 03/26/17 0525  Gross per 24 hour  Intake             1120 ml  Output                0 ml  Net             11 20 ml   Filed Weights   03/25/17 1234 03/25/17 1920  Weight: 49 kg (108 lb) 50.2 kg (110 lb 10.7 oz)    Exam:   General:  Pale, thin but NAD  Cardiovascular: RRR  Respiratory: CTABL  Abdomen: Soft/ND/NT, positive BS  Musculoskeletal: No Edema  Neuro: aaox3  Data Reviewed: Basic Metabolic Panel:  Recent Labs Lab 03/25/17 1321 03/26/17 0355  NA 134* 136  K 4.2 4.2  CL 102 108  CO2 26 22  GLUCOSE 117* 93  BUN 12 10  CREATININE 0.54 0.57  CALCIUM 8.8* 8.3*   Liver Function Tests:  Recent Labs Lab 03/25/17 1321 03/26/17 0355  AST 37 38  ALT 28 30  ALKPHOS 66 56  BILITOT 0.5 0.5  PROT 6.0* 5.3*  ALBUMIN 3.5 3.1*    Recent Labs Lab 03/25/17 1321  LIPASE 31   No results for input(s): AMMONIA in the last 168 hours. CBC:  Recent Labs Lab 03/25/17 1321 03/26/17 0355  WBC 1.0* 1.4*  NEUTROABS 0.0* 0.0*  HGB 9.2* 9.0*  HCT 28.4* 26.9*  MCV 86.6 87.6  PLT 239 213   Cardiac Enzymes:   No results for input(s): CKTOTAL, CKMB, CKMBINDEX, TROPONINI in the last 168 hours. BNP (last 3 results) No results for input(s): BNP in the last 8760 hours.  ProBNP (last 3 results) No results for input(s): PROBNP in the last 8760 hours.  CBG: No results for input(s): GLUCAP in the last 168 hours.  No results found for this or any previous visit (from the past 240 hour(s)).   Studies: Dg Chest 2 View  Result Date: 03/25/2017 CLINICAL DATA:  Chemotherapy for breast cancer. Intermittent fever beginning yesterday. EXAM: CHEST  2 VIEW COMPARISON:  None. FINDINGS: Heart size is normal. There is aortic atherosclerosis. Mediastinal shadows are otherwise normal. Insignificant azygos  fissure. The lungs are clear. No infiltrate, collapse or effusion. Minimal spinal curvature. No significant bone finding. IMPRESSION: No active disease.  No cause of fever identified. Electronically Signed   By: Nelson Chimes M.D.   On: 03/25/2017 14:01    Scheduled Meds: . cholecalciferol  1,000 Units Oral Daily  . enoxaparin (LOVENOX) injection  40 mg Subcutaneous QHS  . niacin  50 mg Oral QHS  . thyroid  30 mg Oral QAC breakfast  . vitamin C  250 mg Oral Daily  . vitamin E  200 Units Oral BID  . zinc sulfate  220 mg Oral Daily    Continuous Infusions: . sodium chloride 75 mL/hr at 03/26/17 0525  . piperacillin-tazobactam (ZOSYN)  IV 3.375 g (03/26/17 8937)     Time spent: 87mins  Everard Interrante MD, PhD  [facility] Pager [phone]. If 7PM-7AM, please contact night-coverage at [email or web address], password [facility] 03/26/2017, 7:50 AM  LOS: 0 days

## 2017-03-27 LAB — CBC WITH DIFFERENTIAL/PLATELET
BASOS PCT: 1 %
Basophils Absolute: 0.1 10*3/uL (ref 0.0–0.1)
EOS ABS: 0 10*3/uL (ref 0.0–0.7)
Eosinophils Relative: 0 %
HCT: 27.3 % — ABNORMAL LOW (ref 36.0–46.0)
Hemoglobin: 8.7 g/dL — ABNORMAL LOW (ref 12.0–15.0)
Lymphocytes Relative: 19 %
Lymphs Abs: 1.7 10*3/uL (ref 0.7–4.0)
MCH: 28 pg (ref 26.0–34.0)
MCHC: 31.9 g/dL (ref 30.0–36.0)
MCV: 87.8 fL (ref 78.0–100.0)
MONO ABS: 2.2 10*3/uL — AB (ref 0.1–1.0)
Monocytes Relative: 24 %
NEUTROS ABS: 5 10*3/uL (ref 1.7–7.7)
NEUTROS PCT: 56 %
Platelets: 236 10*3/uL (ref 150–400)
RBC: 3.11 MIL/uL — ABNORMAL LOW (ref 3.87–5.11)
RDW: 16.4 % — ABNORMAL HIGH (ref 11.5–15.5)
WBC: 9 10*3/uL (ref 4.0–10.5)

## 2017-03-27 LAB — BASIC METABOLIC PANEL
ANION GAP: 7 (ref 5–15)
BUN: 10 mg/dL (ref 6–20)
CHLORIDE: 109 mmol/L (ref 101–111)
CO2: 24 mmol/L (ref 22–32)
Calcium: 8.7 mg/dL — ABNORMAL LOW (ref 8.9–10.3)
Creatinine, Ser: 0.5 mg/dL (ref 0.44–1.00)
GFR calc non Af Amer: >60 mL/min (ref >60)
Glucose, Bld: 85 mg/dL (ref 65–99)
POTASSIUM: 4.1 mmol/L (ref 3.5–5.1)
Sodium: 140 mmol/L (ref 135–145)

## 2017-03-27 LAB — MAGNESIUM: Magnesium: 1.9 mg/dL (ref 1.7–2.4)

## 2017-03-27 LAB — CORTISOL: Cortisol, Plasma: 6.3 ug/dL

## 2017-03-27 MED ORDER — CIPROFLOXACIN HCL 500 MG PO TABS: 500.0000 mg | ORAL_TABLET | Freq: Two times a day (BID) | ORAL | 0 refills | Status: AC

## 2017-03-27 NOTE — Discharge Summary (Signed)
Discharge Summary  Paula [name] RKY:[id number] DOB: [birth year]  PCP: Hayden Rasmussen, MD  Admit date: 03/25/2017 Discharge date: 03/27/2017  Time spent: <20mins  Recommendations for Outpatient Follow-up:  1. F/u with oncology Dr Burr Medico  Discharge Diagnoses:  Active Hospital Problems   Diagnosis Date Noted  . Neutropenia with fever ([place]) 03/25/2017  . Febrile neutropenia ([place]) 03/25/2017  . Breast cancer of upper-outer quadrant of right female breast ([place]) 01/08/2017  . Hyperlipidemia 03/30/2014    Resolved Hospital Problems   Diagnosis Date Noted Date Resolved  No resolved problems to display.    Discharge Condition: stable  Diet recommendation: regular diet  Filed Weights   03/25/17 1234 03/25/17 1920 03/27/17 0645  Weight: 49 kg (108 lb) 50.2 kg (110 lb 10.7 oz) 48.6 kg (107 lb 2.3 oz)    History of present illness:  Paula [name]  is a 61 y.o. 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  Patient got her last chemotherapy on 03/12/2017, she was offered Neulasta by the oncology team but she refused, she comes in with one-day history of fevers at  home, not associated with chills, minimal dry cough but no other complaints, no headache, no chest pain, no abdominal pain or diarrhea, no new joint pains or aches or skin rashes or bruises. Came to the ER where her white count was 1, chest x-ray was unremarkable, blood cultures were drawn, she was placed on empiric IV antibiotics and I was called to admit.  Hospital Course:  Principal Problem:   Neutropenia with fever (HCC) Active Problems:   Hyperlipidemia   Breast cancer of upper-outer quadrant of right female breast (HCC)   Febrile neutropenia ([place])   1. Febrile neutropenia in a patient with history of breast cancer who is on chemotherapy with last treatment on the 16th of this month, she declined Lunesta after her recent chemo  blood cultures no growth, chest x-ray and ua  is unremarkable, She is nontoxic appearing.  Continue IV Zosyn which is started in the ED and ivf, neurophil 0, start neutropenia precaution Oncology Dr Burr Medico consulted, patient has finally agreed to gcsf injection which is given on 5/30 neutrophil counts improved to 5k/ul, she is discharged on cipro for three more days  2. 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 Mildanemia of chronic disease. No acute issues monitor.  4. History of Giardia, tapeworm infection, EBV chronic infection. Follow with PCP. She does not want conventional treatment and is currently following with a veterinary physician for these issues. she declined ID referral      Family Communication:patient  Code Status Full  Disposition Plan: home ,  neutrophil counts improved with oncology clearance    Consultants:  oncology  Procedures:  none  Antibiotics:  zosyn    Discharge Exam: BP 108/64 (BP Location: Left Arm)   Pulse (!) 108   Temp 98.4 F  (36.9 C) (Oral)   Resp 20   Ht 5\' 4"  (1.626 m)   Wt 48.6 kg (107 lb 2.3 oz)   SpO2 99%   BMI 18.39 kg/m    General:  Pale, thin but NAD  Cardiovascular: RRR  Respiratory: CTABL  Abdomen: Soft/ND/NT, positive BS  Musculoskeletal: No Edema  Neuro: aaox3    Discharge Instructions You were cared for by a hospitalist during your hospital stay. If you have any questions about your discharge medications or the care you received while you were in the hospital after you are discharged, you can call the unit and asked to speak with the hospitalist on call if the hospitalist that took care of you is not available. Once you are discharged, your primary care physician will handle any further medical issues. Please note that NO REFILLS for any discharge medications will be authorized once you are discharged, as it is imperative that you return to your primary care physician (or establish a relationship with a primary care physician if you do not have one) for your aftercare needs so that they can reassess your need for medications and monitor your lab values.  Discharge Instructions    Diet general    Complete by:  As directed    Increase activity slowly    Complete by:  As directed      Allergies as of 03/27/2017   No Known Allergies     Medication List    TAKE these medications   ARMOUR THYROID 30 MG tablet Generic drug:  thyroid 1 1/2 TABLET EVERY DAY   Betaine HCl 650-130 MG Caps Take 1 capsule by mouth 3 (three) times daily with meals. Betaine HCL [place] [employer]   cholecalciferol 1000 units tablet Commonly known as:  VITAMIN D Take 1,000 Units by mouth daily.   ciprofloxacin 500 MG tablet Commonly known as:  CIPRO Take 1 tablet (500 mg total) by mouth 2 (two) times daily.   co-enzyme Q-10 50 MG capsule Take 50 mg by mouth.   FRANKINCENSE UPLIFTING Oil Inhale into the lungs.   Garlic 10 MG Caps Take by mouth. Dosing unknown   GLUTAMINE PO Take by  mouth. For leaky gut   IODINE (KELP) PO Take by mouth. ? Dose 6mg ?   MAGNESIUM CITRATE PO Take 200 mg by mouth 2 (two) times daily.   Melatonin 3-2 MG Tabs Take by mouth every evening. Actual dose is 3-6mg    niacin 50 MG tablet Take 50 mg by mouth at bedtime.   OIL OF OREGANO PO Take by mouth 3 (three) times daily with meals.   ondansetron 8 MG tablet Commonly known as:  ZOFRAN Take 1 tablet (8 mg total) by mouth 2 (two) times daily as needed for refractory nausea / vomiting. Start on day 3 after chemo.   prochlorperazine 10 MG tablet Commonly known as:  COMPAZINE Take 1 tablet (10 mg total) by mouth every 6 (six) hours as needed (Nausea or vomiting).   TURMERIC PO Take 250 mg by mouth daily.   V-R SELENIUM 200 MCG Tabs Generic drug:  Selenium Take by mouth.   vitamin C 100 MG tablet Take 100 mg by mouth 2 (two) times daily.  vitamin E 200 UNIT capsule Generic drug:  vitamin E Take 200 Units by mouth 2 (two) times daily.   VITAMIN K PO Take 2 mg by mouth daily.   ZINC ASPARTATE PO Take 15 mg by mouth daily. For leaky gut      No Known Allergies Follow-up Information    Truitt Merle, MD Follow up.   Specialties:  Hematology, Oncology Contact information: [address] [place] [address] [phone]            The results of significant diagnostics from this hospitalization (including imaging, microbiology, ancillary and laboratory) are listed below for reference.    Significant Diagnostic Studies: Dg Chest 2 View  Result Date: 03/25/2017 CLINICAL DATA:  Chemotherapy for breast cancer. Intermittent fever beginning yesterday. EXAM: CHEST  2 VIEW COMPARISON:  None. FINDINGS: Heart size is normal. There is aortic atherosclerosis. Mediastinal shadows are otherwise normal. Insignificant azygos fissure. The lungs are clear. No infiltrate, collapse or effusion. Minimal spinal curvature. No significant bone finding. IMPRESSION: No active disease.   No cause of fever identified. Electronically Signed   By: Nelson Chimes M.D.   On: 03/25/2017 14:01    Microbiology: Recent Results (from the past 240 hour(s))  Culture, blood (routine x 2)     Status: None (Preliminary result)   Collection Time: 03/25/17  1:29 PM  Result Value Ref Range Status   Specimen Description BLOOD LEFT WRIST  Final   Special Requests   Final    BOTTLES DRAWN AEROBIC AND ANAEROBIC Blood Culture adequate volume   Culture   Final    NO GROWTH < 24 HOURS Performed at [facility], [address], [place], [place] [address]    Report Status PENDING  Incomplete  Culture, blood (routine x 2)     Status: None (Preliminary result)   Collection Time: 03/25/17  3:23 PM  Result Value Ref Range Status   Specimen Description BLOOD LEFT ANTECUBITAL  Final   Special Requests   Final    BOTTLES DRAWN AEROBIC AND ANAEROBIC Blood Culture adequate volume   Culture   Final    NO GROWTH < 24 HOURS Performed at [facility], [place] [address], [place], [place] [address]    Report Status PENDING  Incomplete  Culture, Urine     Status: None   Collection Time: 03/25/17  3:49 PM  Result Value Ref Range Status   Specimen Description URINE, CLEAN CATCH  Final   Special Requests NONE  Final   Culture   Final    NO GROWTH Performed at [facility], [place] [address] [place], [place] [address]    Report Status 03/26/2017 FINAL  Final     Labs: Basic Metabolic Panel:  Recent Labs Lab 03/25/17 1321 03/26/17 0355 03/27/17 0405  NA 134* 136 140  K 4.2 4.2 4.1  CL 102 108 109  CO2 26 22 24   GLUCOSE 117* 93 85  BUN 12 10 10   CREATININE 0.54 0.57 0.50  CALCIUM 8.8* 8.3* 8.7*  MG  --   --  1.9   Liver Function Tests:  Recent Labs Lab 03/25/17 1321 03/26/17 0355  AST 37 38  ALT 28 30  ALKPHOS 66 56  BILITOT 0.5 0.5  PROT 6.0* 5.3*  ALBUMIN 3.5 3.1*    Recent Labs Lab 03/25/17 1321  LIPASE 31   No results for input(s): AMMONIA in the last 168  hours. CBC:  Recent Labs Lab 03/25/17 1321 03/26/17  0355 03/27/17 0405  WBC 1.0* 1.4* 9.0  NEUTROABS 0.0* 0.0* 5.0  HGB 9.2* 9.0* 8.7*  HCT 28.4* 26.9* 27.3*  MCV 86.6 87.6 87.8  PLT 239 213 236   Cardiac Enzymes: No results for input(s): CKTOTAL, CKMB, CKMBINDEX, TROPONINI in the last 168 hours. BNP: BNP (last 3 results) No results for input(s): BNP in the last 8760 hours.  ProBNP (last 3 results) No results for input(s): PROBNP in the last 8760 hours.  CBG: No results for input(s): GLUCAP in the last 168 hours.     SignedFlorencia Reasons MD, PhD  [facility] 03/27/2017, 9:29 AM

## 2017-03-27 NOTE — Progress Notes (Signed)
Paula [name]   DOB:[birth year]   DU#:[id number]   CBJ#:[id number]  Oncology follow-up note  Subjective: Patient received one dose Granix yesterday, ANC increased from 0 to 5K today. Pt remained to be afebrile, VS stable.    Objective:  Vitals:   03/26/17 2309 03/27/17 0645  BP: (!) 87/61 108/64  Pulse: (!) 105 (!) 108  Resp: 20 20  Temp: 98.6 F (37 C) 98.4 F (36.9 C)    Body mass index is 18.39 kg/m.  Intake/Output Summary (Last 24 hours) at 03/27/17 0820 Last data filed at 03/27/17 1607  Gross per 24 hour  Intake              850 ml  Output                5 ml  Net              845 ml     Sclerae unicteric  Oropharynx clear  No peripheral adenopathy  Lungs clear -- no rales or rhonchi  Heart regular rate and rhythm  Abdomen benign  MSK no focal spinal tenderness, no peripheral edema  Neuro nonfocal    CBG (last 3)  No results for input(s): GLUCAP in the last 72 hours.   Labs:  Lab Results  Component Value Date   WBC 9.0 03/27/2017   HGB 8.7 (L) 03/27/2017   HCT 27.3 (L) 03/27/2017   MCV 87.8 03/27/2017   PLT 236 03/27/2017   NEUTROABS 5.0 03/27/2017    Urine Studies No results for input(s): UHGB, CRYS in the last 72 hours.  Invalid input(s): UACOL, UAPR, USPG, UPH, UTP, UGL, UKET, UBIL, UNIT, UROB, ULEU, UEPI, UWBC, URBC, UBAC, CAST, UCOM, [place]  Basic Metabolic Panel:  Recent Labs Lab 03/25/17 1321 03/26/17 0355 03/27/17 0405  NA 134* 136 140  K 4.2 4.2 4.1  CL 102 108 109  CO2 26 22 24   GLUCOSE 117* 93 85  BUN 12 10 10   CREATININE 0.54 0.57 0.50  CALCIUM 8.8* 8.3* 8.7*  MG  --   --  1.9   GFR Estimated Creatinine Clearance: 58.1 mL/min (by C-G formula based on SCr of 0.5 mg/dL). Liver Function Tests:  Recent Labs Lab 03/25/17 1321 03/26/17 0355  AST 37 38  ALT 28 30  ALKPHOS 66 56  BILITOT 0.5 0.5  PROT 6.0* 5.3*  ALBUMIN 3.5 3.1*    Recent Labs Lab 03/25/17 1321  LIPASE 31   No results for input(s): AMMONIA in the  last 168 hours. Coagulation profile No results for input(s): INR, PROTIME in the last 168 hours.  CBC:  Recent Labs Lab 03/25/17 1321 03/26/17 0355 03/27/17 0405  WBC 1.0* 1.4* 9.0  NEUTROABS 0.0* 0.0* 5.0  HGB 9.2* 9.0* 8.7*  HCT 28.4* 26.9* 27.3*  MCV 86.6 87.6 87.8  PLT 239 213 236   Cardiac Enzymes: No results for input(s): CKTOTAL, CKMB, CKMBINDEX, TROPONINI in the last 168 hours. BNP: Invalid input(s): POCBNP CBG: No results for input(s): GLUCAP in the last 168 hours. D-Dimer No results for input(s): DDIMER in the last 72 hours. Hgb A1c No results for input(s): HGBA1C in the last 72 hours. Lipid Profile No results for input(s): CHOL, HDL, LDLCALC, TRIG, CHOLHDL, LDLDIRECT in the last 72 hours. Thyroid function studies No results for input(s): TSH, T4TOTAL, T3FREE, THYROIDAB in the last 72 hours.  Invalid input(s): FREET3 Anemia work up No results for input(s): VITAMINB12, FOLATE, FERRITIN, TIBC, IRON, RETICCTPCT in the last 72 hours. Microbiology Recent  Results (from the past 240 hour(s))  Culture, blood (routine x 2)     Status: None (Preliminary result)   Collection Time: 03/25/17  1:29 PM  Result Value Ref Range Status   Specimen Description BLOOD LEFT WRIST  Final   Special Requests   Final    BOTTLES DRAWN AEROBIC AND ANAEROBIC Blood Culture adequate volume   Culture   Final    NO GROWTH < 24 HOURS Performed at [facility], [address] [place], [place] [address]    Report Status PENDING  Incomplete  Culture, blood (routine x 2)     Status: None (Preliminary result)   Collection Time: 03/25/17  3:23 PM  Result Value Ref Range Status   Specimen Description BLOOD LEFT ANTECUBITAL  Final   Special Requests   Final    BOTTLES DRAWN AEROBIC AND ANAEROBIC Blood Culture adequate volume   Culture   Final    NO GROWTH < 24 HOURS Performed at [facility], [place] [address], [place], [place] [address]    Report Status PENDING  Incomplete   Culture, Urine     Status: None   Collection Time: 03/25/17  3:49 PM  Result Value Ref Range Status   Specimen Description URINE, CLEAN CATCH  Final   Special Requests NONE  Final   Culture   Final    NO GROWTH Performed at [facility], [place] [address] [place],  [address]    Report Status 03/26/2017 FINAL  Final      Studies:  Dg Chest 2 View  Result Date: 03/25/2017 CLINICAL DATA:  Chemotherapy for breast cancer. Intermittent fever beginning yesterday. EXAM: CHEST  2 VIEW COMPARISON:  None. FINDINGS: Heart size is normal. There is aortic atherosclerosis. Mediastinal shadows are otherwise normal. Insignificant azygos fissure. The lungs are clear. No infiltrate, collapse or effusion. Minimal spinal curvature. No significant bone finding. IMPRESSION: No active disease.  No cause of fever identified. Electronically Signed   By: Nelson Chimes M.D.   On: 03/25/2017 14:01    Assessment: 61 y.o. 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 Neutropenic fever 2. Stage IA triple negative right breast cancer, currently on adjuvant chemotherapy docetaxel and Cytoxan, last cycle on 03/12/2017, patient declined Neulasta after chemo  3. Anemia secondary to chemotherapy   Plan:  -neutropenia resolved with granix, blood and urine cultures remain to be negative so far, ok to discharge home from oncology stand point  -cipro or Levaquin for 3 days  -she will f/u with [place] next week    Truitt Merle, MD 03/27/2017  8:20 AM

## 2017-03-27 NOTE — Progress Notes (Signed)
Location of Breast Cancer: Right Breast  Histology per Pathology Report:  12/05/16 at [place]: Right Axilla, sentinel lymph node, biopsy - Two lymph nodes negative for carcinoma (0,2)  B:Breast, right, needle-localized partial mastectomy -Invasive ductal carcinoma, multifocal (see synoptic report) -Tumor size 25m, 7 mm and 3 mm (see comment) -Ductal carcinoma in situ, nuclear grade 3, solid type with microcalcifications   Receptor Status: ER(NEG), PR (NEG), Her2-neu (NEG)  Did patient present with symptoms or was this found on screening mammography?: It was found on a screening mammgram.   Past/Anticipated interventions by surgeon, if any: 12/05/16 Procedure(s): MASTECTOMY, PARTIAL (EG, LUMPECTOMY, TYLECTOMY, QUADRANTECTOMY, SEGMENTECTOMY)  BX/EXC LYMPH NODE; OPEN, DEEP AXILRY NODE  INTRAOPERATIVE IDENTIFICATION SENTINEL LYMPH NODE(S) INCLUDE INJECTION NON-RADIOACTIVE DYE, WHEN PERFORMED  Note: Revisions to procedures should be made in chart - see Procedures tab.  Performing Service: Surgical Oncology Surgeon(s) and Role: * Kristalyn KDoran Stabler DO - Primary * BOswaldo Milian MD - Resident - Assisting  Interventions by Medical Oncology: Dr. FBurr Medico5/31/18  Pt admitted for neutropenic fever 03/24/17- 03/27/17 Stage IA triple negative right breast cancer, currently on adjuvant chemotherapy docetaxel and Cytoxan, last cycle on 03/12/2017, patient declined Neulasta after chemo. She will not receive any further chemotherapy.  Lymphedema issues, if any:  She denies. She has good arm mobility.  Pain issues, if any:  She did report some chest pain while riding her bike today. She described it as a sharp pain  SAFETY ISSUES:  Prior radiation? No  Pacemaker/ICD? No  Possible current pregnancy? No  Is the patient on methotrexate? No  Current Complaints / other details:   BP (!) 99/57   Pulse 99   Temp 97.9 F (36.6 C)   Ht '5\' 4"'  (1.626 m)   Wt 108 lb  3.2 oz (49.1 kg)   SpO2 98% Comment: room air  BMI 18.57 kg/m    Wt Readings from Last 3 Encounters:  04/01/17 108 lb 3.2 oz (49.1 kg)  03/27/17 107 lb 2.3 oz (48.6 kg)  03/12/17 108 lb 4.8 oz (49.1 kg)

## 2017-03-27 NOTE — Progress Notes (Signed)
D/C "D home with her husband. Both verbalized understanding  of d/c instuctions

## 2017-03-28 ENCOUNTER — Telehealth: Payer: Self-pay | Admitting: *Deleted

## 2017-03-28 NOTE — Telephone Encounter (Signed)
Pt called requesting a call back from nurse about chemo treatment.  Spoke with pt, and was informed that pt has concerns about taking the last cycle of chemo with Cytoxan - side effects of causing secondary blood cancer. Pt would like to talk with Dr. Burr Medico before next appt on Wed 04/02/17.   Pt needs to make an informed decision about taking Cytoxan treatment. Pt's    Phone     [phone].

## 2017-03-28 NOTE — Telephone Encounter (Signed)
I called back and left a message, regarding cytoxan and risk of leukemia/MDS. I encouraged her to keep her appointment and complete last cycle, but if she absolutely does not want it, okay to cancel. I encouraged her to keep her appointment with [place] next time as it's scheduled.   Truitt Merle MD

## 2017-03-30 LAB — CULTURE, BLOOD (ROUTINE X 2)
CULTURE: NO GROWTH
Culture: NO GROWTH
SPECIAL REQUESTS: ADEQUATE
SPECIAL REQUESTS: ADEQUATE

## 2017-03-31 ENCOUNTER — Ambulatory Visit (HOSPITAL_BASED_OUTPATIENT_CLINIC_OR_DEPARTMENT_OTHER): Payer: Managed Care, Other (non HMO)

## 2017-03-31 ENCOUNTER — Telehealth: Payer: Self-pay | Admitting: Hematology

## 2017-03-31 ENCOUNTER — Encounter: Payer: Self-pay | Admitting: Hematology

## 2017-03-31 ENCOUNTER — Ambulatory Visit (HOSPITAL_BASED_OUTPATIENT_CLINIC_OR_DEPARTMENT_OTHER): Payer: Managed Care, Other (non HMO) | Admitting: Hematology

## 2017-03-31 DIAGNOSIS — M818 Other osteoporosis without current pathological fracture: Secondary | ICD-10-CM

## 2017-03-31 DIAGNOSIS — C50411 Malignant neoplasm of upper-outer quadrant of right female breast: Secondary | ICD-10-CM

## 2017-03-31 DIAGNOSIS — Z171 Estrogen receptor negative status [ER-]: Secondary | ICD-10-CM

## 2017-03-31 LAB — COMPREHENSIVE METABOLIC PANEL
ALK PHOS: 87 U/L (ref 40–150)
ALT: 40 U/L (ref 0–55)
AST: 38 U/L — AB (ref 5–34)
Albumin: 3.9 g/dL (ref 3.5–5.0)
Anion Gap: 8 mEq/L (ref 3–11)
BILIRUBIN TOTAL: 0.23 mg/dL (ref 0.20–1.20)
BUN: 14.3 mg/dL (ref 7.0–26.0)
CALCIUM: 10 mg/dL (ref 8.4–10.4)
CO2: 27 mEq/L (ref 22–29)
CREATININE: 0.7 mg/dL (ref 0.6–1.1)
Chloride: 105 mEq/L (ref 98–109)
EGFR: >90 mL/min/{1.73_m2} (ref >90)
GLUCOSE: 89 mg/dL (ref 70–140)
Potassium: 4.3 mEq/L (ref 3.5–5.1)
SODIUM: 140 meq/L (ref 136–145)
TOTAL PROTEIN: 6.9 g/dL (ref 6.4–8.3)

## 2017-03-31 LAB — CBC WITH DIFFERENTIAL/PLATELET
BASO%: 0.7 % (ref 0.0–2.0)
BASOS ABS: 0.1 10*3/uL (ref 0.0–0.1)
EOS ABS: 0 10*3/uL (ref 0.0–0.5)
EOS%: 0.1 % (ref 0.0–7.0)
HEMATOCRIT: 33.8 % — AB (ref 34.8–46.6)
HGB: 10.8 g/dL — ABNORMAL LOW (ref 11.6–15.9)
LYMPH#: 3.1 10*3/uL (ref 0.9–3.3)
LYMPH%: 27.8 % (ref 14.0–49.7)
MCH: 27.6 pg (ref 25.1–34.0)
MCHC: 32 g/dL (ref 31.5–36.0)
MCV: 86.3 fL (ref 79.5–101.0)
MONO#: 0.9 10*3/uL (ref 0.1–0.9)
MONO%: 8.1 % (ref 0.0–14.0)
NEUT%: 63.3 % (ref 38.4–76.8)
NEUTROS ABS: 7.1 10*3/uL — AB (ref 1.5–6.5)
Platelets: 347 10*3/uL (ref 145–400)
RBC: 3.91 10*6/uL (ref 3.70–5.45)
RDW: 18.3 % — AB (ref 11.2–14.5)
WBC: 11.2 10*3/uL — AB (ref 3.9–10.3)

## 2017-03-31 NOTE — Progress Notes (Signed)
[place]  [phone] [phone]  Clinic Follow Up Note   Patient Care Team: Hayden Rasmussen, MD as PCP - General (Family Medicine)   Oncology History   Cancer Staging Malignant neoplasm of upper outer quadrant of female breast [facility]) Staging form: Breast, AJCC 8th Edition - Clinical: No stage assigned - Unsigned - Pathologic: pT1b, pN0, cM0, ER: Negative, PR: Negative, HER2: Negative - Signed by Eppie Gibson, MD on 01/08/2017       Breast cancer of upper-outer quadrant of right female breast ([place])   02/01/2016 Mammogram    Breast density category B. There are new punctate calcification at the 11:00 position of right breast, highly suspicious for malignancy. Patient refused biopsy      12/05/2016 Initial Diagnosis    Malignant neoplasm of upper outer quadrant of female breast ([place])      12/05/2016 Surgery    Right breast lumpectomy and sentinel lymph node biopsy by Dr. Ernst Bowler at [facility]       12/05/2016 Pathology Results    Right breast lumpectomy and a sentinel lymph node biopsy showed invasive ductal carcinoma, multifocal, tumor size 9 mm, 7 mm and 3 mm. Grade 3. High-grade DCIS with microcalcifications. Final margins were negative for invasive and DCIS. 2 sentinel lymph nodes were negative.      12/05/2016 Receptors her2    ER, PR and HER-2 triple negative      01/22/2017 Imaging    Bone Density  ASSESSMENT: The BMD measured at [facility] Sp[facility] L1-L4 is 0.766 g/cm2 with a T-score of -3.5. This patient is considered OSTEOPOROTIC according to [facility] [facility]) criteria. There has been a statistically significant decrease in BMD of left hip since exam dated 03/12/2012. Spine was not compared to prior study due to exclusion of vertebral bodies.      01/29/2017 - 03/12/2017 Adjuvant Chemotherapy    Docetaxel and Cytoxan every 3 weeks starting 01/29/17. Neulasta given 2 days after chemo. She received 3 cycles. Last cycle was canceled by  patient due to the concerns of long-term side effects.      03/25/2017 - 03/27/2017 Hospital Admission    Admit date: 03/25/17 Admission diagnosis: Neutropenic Fever  Additional comments: resolved with granix       CHIEF COMPLAINTS:  Follow up right triple negative breast cancer   HISTORY OF PRESENTING ILLNESS:  Paula [name] 61 y.o. female is here because of recently diagnosed right multifocal ductal carcinoma, triple negative.  She is s/p right lumpectomy and node biopsy on 12/05/2016. She was referred by her radiation oncologist Dr. Isidore Moos. She presents to my clinic by herself today.  The patient had a routine mammogram on 01/2016. The findings demonstrated a group of indeterminate microcalcification over the upper outer right breast for which  A biopsy was recommended, which was declined at the time. The patient opted to pursue a thermogram instead that was reported as normal. A few months after, while walking her dog, and felt pain in her arm. A repeat thermogram was done and showed an abnormality on the opposite side. She then decided to repeat a mammogram.   Repeat mammogram on 10/25/16 showed an irregular 7 mm mass over the posterior third of the upper right breast with few associated microcalcifications, as this mass lies along the posterior edge of the previously described segmental microclacifcations. Targeted ultrasound showed bilobed hypoechoic mass with mild border irregularly over the 10:30 position of the right breast 9 cm from the nipple corresponding to the mass  with the border irregularity.  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  Right lumpectomy and sentinel node dissection on 12/05/16 with Dr. Ernst Bowler revealed multifocal breast cancer - pT1b, pN0.  The patient presents today to discuss the roll that chemotherapy may have to  help with her disease. Patient does not live in [place], but in [place] She reports her sisters have experienced the same thing, which has resulted in [place], Stage 0. She is here to get another opinion, due to her cancer being triple negative. She denies any pain, swelling and has full ROM. Patient does yoga and walk to help. Also denies fever. Patient does not smoke. She does not want to take calcium because she thinks it may have cause colon polyps.   Patient reports history of parasites which was found 20 years ago and tests are still positive. She also has history of EBV virus infection.  Patient is strongly believe homeopathic and alternative medicine, she takes multiple vitamin and herbal supplements.  CURRENT THERAPY: Docetaxel and Cytoxan 4 cycles every 3 weeks and Neulasta on day 3, started 01/29/17.  INTERVAL HISTORY: Paula [name] returns for a follow up. She requested to be seen today to discuss her last cycle of chemotherapy. She was admitted to hospital last week for neutropenic fever, ID workup was negative. Her neutropenia quickly resolved after 1 dose of Granix. She has recovered well since her hospital discharge. She is very concerned about the long-term side effects from chemotherapy, and he requests to cancer her last cycle chemotherapy which is scheduled for this week.  VIEW OF SYSTEMS:   Constitutional: Denies fevers, chills or abnormal night sweats, (+) fatigue Eyes: Denies blurriness of vision, double vision or watery eyes Ears, nose, mouth, throat, and face: Denies mucositis or sore throat Respiratory: Denies cough, dyspnea or wheezes Cardiovascular: Denies palpitation, chest discomfort or lower extremity swelling Gastrointestinal:  Denies nausea, heartburn (+) floating stool  Skin: no rash Lymphatics: Denies new lymphadenopathy or easy bruising Neurological:Denies numbness, tingling or new weaknesses Behavioral/Psych: Mood is stable, no new changes (+) one  time occurrence of depression All other systems were reviewed with the patient and are negative.   MEDICAL HISTORY:  Past Medical History:  Diagnosis Date  . Candida infection    in bowels  . 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 [name] infection   . Hypercholesteremia   . Hypothyroid   . Osteoporosis   . Skin cancer    right side of face basal cell    SURGICAL HISTORY: Past Surgical History:  Procedure Laterality Date  . basal cell skin cancer     to right side of face two times  . BREAST SURGERY     right breast biopsy  . TONSILLECTOMY     as a child    SOCIAL HISTORY: Social History   Social History  . Marital status: Married    Spouse name: N/A  . Number of children: N/A  . Years of education: N/A   Occupational History  . Not on file.   Social History Main Topics  . Smoking status: Former Smoker    Packs/day: 0.25    Years: 2.00    Quit date: 03/30/1976  . Smokeless tobacco: Never Used     Comment: she smoked socially as a teenager  . Alcohol use Yes     Comment:  rare wine use  . Drug use: No  . Sexual activity: Yes    Birth control/ protection: Post-menopausal   Other Topics Concern  . Not on file   Social History Narrative  . No narrative on file    FAMILY HISTORY: Family History  Problem Relation Age of Onset  . Cancer Mother 51       breast  . Cancer Father        pancreatic  . Cancer Sister 96       breast  . Heart disease Maternal Grandfather   . Cancer Paternal Grandmother 34       lymphoma  . Cancer Paternal Grandfather 71       stomach  . Cancer Maternal Grandmother 95       breast cancer   . Cancer Sister 103       breast cancer   2 children, not biological  ALLERGIES:  has No Known Allergies.  MEDICATIONS:  Current Outpatient Prescriptions  Medication Sig Dispense Refill  . ARMOUR THYROID 30 MG tablet 1 1/2 TABLET EVERY DAY  3  . Ascorbic Acid (VITAMIN C) 100 MG tablet Take 100 mg by mouth 2  (two) times daily.    . cholecalciferol (VITAMIN D) 1000 UNITS tablet Take 1,000 Units by mouth daily.    Marland Kitchen co-enzyme Q-10 50 MG capsule Take 50 mg by mouth.    . Digestive Enzymes (BETAINE HCL) 650-130 MG CAPS Take 1 capsule by mouth 3 (three) times daily with meals. Betaine HCL [place] and [place]    . Garlic 10 MG CAPS Take by mouth. Dosing unknown    . GLUTAMINE PO Take by mouth. For leaky gut    . Homeopathic Products (FRANKINCENSE UPLIFTING) OIL Inhale into the lungs.    . IODINE, KELP, PO Take by mouth. ? Dose 26m?    .Marland KitchenMAGNESIUM CITRATE PO Take 200 mg by mouth 2 (two) times daily.    . Melatonin 3-2 MG TABS Take by mouth every evening. Actual dose is 3-68m   . niacin 50 MG tablet Take 50 mg by mouth at bedtime.    . OIL OF OREGANO PO Take by mouth 3 (three) times daily with meals.    . ondansetron (ZOFRAN) 8 MG tablet Take 1 tablet (8 mg total) by mouth 2 (two) times daily as needed for refractory nausea / vomiting. Start on day 3 after chemo. 30 tablet 1  . prochlorperazine (COMPAZINE) 10 MG tablet Take 1 tablet (10 mg total) by mouth every 6 (six) hours as needed (Nausea or vomiting). 30 tablet 1  . Selenium (V-R SELENIUM) 200 MCG TABS Take by mouth.    . TURMERIC PO Take 250 mg by mouth daily.    . vitamin E (VITAMIN E) 200 UNIT capsule Take 200 Units by mouth 2 (two) times daily.    . Marland KitchenITAMIN K PO Take 2 mg by mouth daily.    . Marland KitchenINC ASPARTATE PO Take 15 mg by mouth daily. For leaky gut     No current facility-administered medications for this visit.     PHYSICAL EXAMINATION:  ECOG PERFORMANCE STATUS: 1 GENERAL:alert, no distress and comfortable SKIN: skin color, texture, turgor are normal, no rashes or significant lesions EYES: normal, conjunctiva are pink and non-injected, sclera clear OROPHARYNX:no exudate, no erythema and lips, buccal mucosa, and tongue normal  NECK: supple, thyroid normal size, non-tender, without nodularity LYMPH:  no palpable  lymphadenopathy in the cervical, axillary or inguinal LUNGS:  clear to auscultation and percussion with normal breathing effort HEART: regular rate & rhythm and no murmurs and no lower extremity edema ABDOMEN:abdomen soft, non-tender and normal bowel sounds Musculoskeletal:no cyanosis of digits and no clubbing  PSYCH: alert & oriented x 3 with fluent speech NEURO: no focal motor/sensory deficits BREAST: The incision in right breast above areola well healed. No palpable mass or adenopathy. Nipple pink, no erythema.  LABORATORY DATA:  I have reviewed the data as listed CBC Latest Ref Rng & Units 03/31/2017 03/27/2017 03/26/2017  WBC 3.9 - 10.3 10e3/uL 11.2(H) 9.0 1.4(LL)  Hemoglobin 11.6 - 15.9 g/dL 10.8(L) 8.7(L) 9.0(L)  Hematocrit 34.8 - 46.6 % 33.8(L) 27.3(L) 26.9(L)  Platelets 145 - 400 10e3/uL 347 236 213   CMP Latest Ref Rng & Units 03/31/2017 03/27/2017 03/26/2017  Glucose 70 - 140 mg/dl 89 85 93  BUN 7.0 - 26.0 mg/dL 14._0 Creatinine 0.6 - 1.1 mg/dL 0.7 0.50 0.57  Sodium 136 - 145 mEq/L 140 140 136  Potassium 3.5 - 5.1 mEq/L 4.3 4.1 4.2  Chloride 101 - 111 mmol/L - 109 108  CO2 22 - 29 mEq/L _1 Calcium 8.4 - 10.4 mg/dL 10.0 8.7(L) 8.3(L)  Total Protein 6.4 - 8.3 g/dL 6.9 - 5.3(L)  Total Bilirubin 0.20 - 1.20 mg/dL 0.23 - 0.5  Alkaline Phos 40 - 150 U/L 87 - 56  AST 5 - 34 U/L 38(H) - 38  ALT 0 - 55 U/L 40 - 30   PATHOLOGY REPORT  See onc history   RADIOGRAPHIC STUDIES: I have personally reviewed the radiological images as listed and agreed with the findings in the report. Dg Chest 2 View  Result Date: 03/25/2017 CLINICAL DATA:  Chemotherapy for breast cancer. Intermittent fever beginning yesterday. EXAM: CHEST  2 VIEW COMPARISON:  None. FINDINGS: Heart size is normal. There is aortic atherosclerosis. Mediastinal shadows are otherwise normal. Insignificant azygos fissure. The lungs are clear. No infiltrate, collapse or effusion. Minimal spinal curvature. No significant  bone finding. IMPRESSION: No active disease.  No cause of fever identified. Electronically Signed   By: Nelson Chimes M.D.   On: 03/25/2017 14:01    ASSESSMENT & PLAN: 61 y.o. Caucasian female, postmenopausal  1. 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 -She had early stage breast cancer, had a complete surgical resection. -she was reluctant to take adjuvant chemo, and finally has started TC, plan for 4 cycles -Patient takes multiple vitamins and herbs supplements, I advised the patient not tohold herbal supplements during her chemo -She tolerated chemotherapy well.  -She declined Neulasta after this cycle chemotherapy, she was admitted for neutropenic fever after chemo, ID workup was negative. Her neutropenic resolved with 1 dose of Granix. -She is very concerned about long-term side effects from chemotherapy, especially the small risk of leukemia and MDS. I tried to encourage her to take the last cycle chemotherapy this week, and explained to her that the risk is small. I did state that the benefit of last cycle chemo is probably less than the first few cycles. After lengthy discussion, patient decided not to pursue the last cycle chemotherapy. -She is scheduled to see radiation oncologist Dr. Isidore Moos tomorrow, she is also very concerned about potential long-term side effects from radiation, such as cancer. She will discuss with Dr. Isidore Moos tomorrow. -We discussed  cancer surveillance after her adjuvant therapy. I recommend close follow-up every 3-4 months for the first 2 years, then every 6 months for total 5 years.  2. Genetic Testing - Mother and two sisters with breast cancer, all tested negative. Sister with VUS in BRCA2. 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 - Father had pancreatic cancer   3. 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 She is being treated by Dr. Julien Nordmann at [facility] in [place].  -I previously offered her a referral to see infection disease specialist if she decides to take chemotherapy. She deferred.  4. Osteoporosis -Bone scan on 01/22/17 revealed worsening osteoporosis. -I advised the patient to take Calcium and Vitamin D supplementation. -The patient does not want to take steroids for her treatment since steroids could weaken her bone. -I previously discussed that she might develop reactions during chemotherapy; such as a drop in BP, skin rash, itchy skin. -The patient still insists not have steroids with her IV chemotherapy infusion to reduce the risk of reaction.   5. Neutropenic fever -Resolved.  Plan:   -The patient decided not to pursue the last cycle chemotherapy, which was scheduled for this Wednesday. I will cancel her appointments. -She is scheduled to see radiation oncologist Dr. Isidore Moos tomorrow to discuss adjuvant breast irradiation -I'll see her back with lab in 2 months for follow up.    No orders of the defined types were placed in this encounter.   All questions were answered. The patient knows to call the clinic with any problems, questions or concerns.  I spent 20 minutes counseling the patient face to face. The total time spent in the appointment was 30 minutes and more than 50% was on counseling.     Truitt Merle, MD 03/31/2017

## 2017-03-31 NOTE — Telephone Encounter (Signed)
[place] appt per 6/4 sch message -gave patient calender.

## 2017-04-01 ENCOUNTER — Encounter: Payer: Self-pay | Admitting: Radiation Oncology

## 2017-04-01 ENCOUNTER — Ambulatory Visit
Admission: RE | Admit: 2017-04-01 | Discharge: 2017-04-01 | Disposition: A | Discharge status: Home / Self Care | Payer: Managed Care, Other (non HMO) | Source: Ambulatory Visit | Attending: Radiation Oncology | Admitting: Radiation Oncology

## 2017-04-01 ENCOUNTER — Encounter: Payer: Self-pay | Admitting: *Deleted

## 2017-04-01 VITALS — BP 99/57 | HR 99 | Temp 97.9°F | Ht 64.0 in | Wt 108.2 lb

## 2017-04-01 DIAGNOSIS — Z809 Family history of malignant neoplasm, unspecified: Secondary | ICD-10-CM | POA: Diagnosis not present

## 2017-04-01 DIAGNOSIS — Z510 Encounter for antineoplastic radiation therapy: Secondary | ICD-10-CM | POA: Diagnosis not present

## 2017-04-01 DIAGNOSIS — R079 Chest pain, unspecified: Secondary | ICD-10-CM | POA: Diagnosis not present

## 2017-04-01 DIAGNOSIS — C50411 Malignant neoplasm of upper-outer quadrant of right female breast: Secondary | ICD-10-CM

## 2017-04-01 DIAGNOSIS — E7800 Pure hypercholesterolemia, unspecified: Secondary | ICD-10-CM | POA: Diagnosis not present

## 2017-04-01 DIAGNOSIS — Z171 Estrogen receptor negative status [ER-]: Secondary | ICD-10-CM | POA: Diagnosis not present

## 2017-04-01 DIAGNOSIS — E039 Hypothyroidism, unspecified: Secondary | ICD-10-CM | POA: Diagnosis not present

## 2017-04-01 DIAGNOSIS — M810 Age-related osteoporosis without current pathological fracture: Secondary | ICD-10-CM | POA: Diagnosis not present

## 2017-04-01 DIAGNOSIS — Z85828 Personal history of other malignant neoplasm of skin: Secondary | ICD-10-CM | POA: Diagnosis not present

## 2017-04-01 DIAGNOSIS — B89 Unspecified parasitic disease: Secondary | ICD-10-CM

## 2017-04-01 DIAGNOSIS — Z87891 Personal history of nicotine dependence: Secondary | ICD-10-CM | POA: Diagnosis not present

## 2017-04-01 NOTE — Progress Notes (Signed)
  Radiation Oncology         [phone] ________________________________  Name: Paula [name] MRN: [id number]  Date: 04/01/2017  DOB: 1956-01-17  SIMULATION AND TREATMENT PLANNING NOTE    outpatient  DIAGNOSIS:     ICD-9-CM ICD-10-CM   1. Malignant neoplasm of upper-outer quadrant of right breast in female, estrogen receptor negative ([place]) 174.4 C50.411    V86.1 Z17.1     NARRATIVE:  The patient was brought to the [place].  Identity was confirmed.  All relevant records and images related to the planned course of therapy were reviewed.  The patient freely provided informed written consent to proceed with treatment after reviewing the details related to the planned course of therapy. The consent form was witnessed and verified by the simulation staff.    Then, the patient was set-up in a stable reproducible supine position for radiation therapy with her ipsilateral arm over her head, and her upper body secured in a custom-made Vac-lok device.  CT images were obtained.  Surface markings were placed.  The CT images were loaded into the planning software.    TREATMENT PLANNING NOTE: Treatment planning then occurred.  The radiation prescription was entered and confirmed.     A total of 3 medically necessary complex treatment devices were fabricated and supervised by me: 2 fields with MLCs for custom blocks to protect heart, and lungs;  and, a Vac-lok. MORE COMPLEX DEVICES MAY BE MADE IN DOSIMETRY FOR FIELD IN FIELD BEAMS FOR DOSE HOMOGENEITY.  I have requested : 3D Simulation which is medically necessary to give adequate dose to at risk tissues while sparing lungs and heart.  I have requested a DVH of the following structures: lungs, heart, lumpectomy cavity (right).    The patient will receive 42.56 Gy in 16 fractions to the right breast with 2 tangential fields.   This will not be followed by a boost.  Optical Surface Tracking Plan:  Since intensity modulated  radiotherapy (IMRT) and 3D conformal radiation treatment methods are predicated on accurate and precise positioning for treatment, intrafraction motion monitoring is medically necessary to ensure accurate and safe treatment delivery. The ability to quantify intrafraction motion without excessive ionizing radiation dose can only be performed with optical surface tracking. Accordingly, surface imaging offers the opportunity to obtain 3D measurements of patient position throughout IMRT and 3D treatments without excessive radiation exposure. I am ordering optical surface tracking for this patient's upcoming course of radiotherapy.  ________________________________   Reference:  Ursula Alert, J, et al. Surface imaging-based analysis of intrafraction motion for breast radiotherapy patients.Journal of [place], n. 6, nov. 2014. ISSN [id number].  Available at: <http://www.jacmp.org/index.php/jacmp/article/view/4957>.    -----------------------------------  Eppie Gibson, MD

## 2017-04-01 NOTE — Progress Notes (Signed)
[place] Work  Patient came to [place] office (6/4) requesting emotional support.  Patient expressed concerns regarding treatment decisions.  CSW provided a space for patient to express her fears and concerns.  CSW and patient processed her options and identified appropriate questions for her healthcare team.  CSW encouraged patient to meet with her oncologist/healthcare team to get additional information that will assist her in making decisions regarding her healthcare.  Patient was appreciative of CSW support.  CSW encouraged patient to call with needs or concerns.   Johnnye Lana, MSW, LCSW, OSW-C Clinical Social Worker [facility] [phone]

## 2017-04-01 NOTE — Progress Notes (Signed)
Radiation Oncology         [phone] ________________________________  Name: Paula [name] MRN: [id number]  Date: 04/01/2017  DOB: 20-May-1956  Follow-Up Visit Note  Outpatient  CC: Hayden Rasmussen, MD  Truitt Merle, MD  Diagnosis:       ICD-9-CM ICD-10-CM   1. 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 She was initially seen in consultation in the multidisciplinary breast clinic on 01/07/17.    The patient had previously undergone right lumpectomy on 12/05/16 at [facility]. Pathology revealed invasive ductal carcinoma, multifocal, spanning 9 mm, 7 mm, and 3 mm, as well as DCIS grade 3 with microcalcification.    Since initial consultation, the patient began adjuvant chemotherapy with docetaxel and cytoxan on 01/29/17. She received 3 cycles; final cycle on 03/12/17. She declined Neulasta after chemotherapy. She will not receive any further chemotherapy due to concerns of long term side effects.  Of note, the patient was admitted to the hospital from 03/24/17 to 03/27/17 for neutropenic fever.  The patient presents to the clinic today to discuss the role that radiation therapy may play in the treatment of her disease.   On review of systems, the patient denies lymphedema. She reports good arm mobility. She does report some sharp chest pain while riding her bike today - transient, no SOB, nor  diaphoresis, nor chest pressure nor radiation. She reports ongoing feelings of fatigue, and does not believe she has recovered from chemotherapy.  ALLERGIES:  has No Known Allergies.  Meds: Current Outpatient Prescriptions  Medication Sig Dispense Refill  . ARMOUR THYROID 30 MG tablet 1 1/2 TABLET EVERY DAY  3  . co-enzyme Q-10 50 MG capsule Take 50 mg by mouth.    . Digestive Enzymes (BETAINE HCL) 650-130 MG CAPS Take 1 capsule by mouth 3 (three) times daily with meals. Betaine HCL [place]    . IODINE, KELP, PO Take by mouth. ? Dose 2m?    . Melatonin 3-2 MG TABS Take by mouth every evening. Actual dose is 3-654m   . TURMERIC PO Take 250 mg by mouth daily.    . Marland KitchenITAMIN K PO Take 2 mg by mouth daily.    . Ascorbic Acid (VITAMIN C) 100 MG tablet Take 100 mg by mouth 2 (two) times daily.    . cholecalciferol (VITAMIN D) 1000 UNITS tablet Take 1,000 Units by mouth daily.    . Garlic 10 MG CAPS Take by mouth. Dosing unknown    . GLUTAMINE PO Take by mouth. For leaky gut    . Homeopathic Products (FRANKINCENSE UPLIFTING) OIL Inhale into the lungs.    . Marland KitchenAGNESIUM CITRATE PO Take 200 mg by mouth 2 (two) times daily.    . niacin 50 MG tablet Take 50 mg by mouth at bedtime.    . OIL OF OREGANO PO Take by mouth 3 (three) times daily with meals.    .Marland Kitchen  ondansetron (ZOFRAN) 8 MG tablet Take 1 tablet (8 mg total) by mouth 2 (two) times daily as needed for refractory nausea / vomiting. Start on day 3 after chemo. (Patient not taking: Reported on 04/01/2017) 30 tablet 1  . prochlorperazine (COMPAZINE) 10 MG tablet Take 1 tablet (10 mg total) by mouth every 6 (six) hours as needed (Nausea or vomiting). (Patient not taking: Reported on 04/01/2017) 30 tablet 1  . Selenium (V-R SELENIUM) 200 MCG TABS Take by mouth.    . vitamin E (VITAMIN E) 200 UNIT capsule Take 200 Units by mouth 2 (two) times daily.    Marland Kitchen ZINC ASPARTATE PO Take 15 mg by mouth daily. For leaky gut     No current  facility-administered medications for this encounter.     Physical Findings: The patient is in no acute distress. Patient is alert and oriented. Wt Readings from Last 3 Encounters:  04/01/17 108 lb 3.2 oz (49.1 kg)  03/27/17 107 lb 2.3 oz (48.6 kg)  03/12/17 108 lb 4.8 oz (49.1 kg)    height is '5\' 4"'  (1.626 m) and weight is 108 lb 3.2 oz (49.1 kg). Her temperature is 97.9 F (36.6 C). Her blood pressure is 99/57 (abnormal) and her pulse is 99. Her oxygen saturation is 98%.  General: Alert and oriented, in no acute distress. HEENT: Oral cavity is clear. EOMI.  Heart: Regular in rate and rhythm with no murmurs. Chest: Clear to auscultation bilaterally. Abdomen: Soft, non tender, non distended. Extremities: No edema in extremities. Lymphatics: see Neck Exam Psychiatric: Judgment and insight are intact. Affect is appropriate. Examination of the breasts reveals a scar that is well healed just at the upper edge of the right areola. No palpable masses in the right breast or axilla. No palpable masses in left breast or axilla.  Lab Findings: Lab Results  Component Value Date   WBC 11.2 (H) 03/31/2017   HGB 10.8 (L) 03/31/2017   HCT 33.8 (L) 03/31/2017   MCV 86.3 03/31/2017   PLT 347 03/31/2017    Lab Results  Component Value Date   TSH 2.073 03/30/2014    Radiographic Findings: Dg Chest 2 View  Result Date: 03/25/2017 CLINICAL DATA:  Chemotherapy for breast cancer. Intermittent fever beginning yesterday. EXAM: CHEST  2 VIEW COMPARISON:  None. FINDINGS: Heart size is normal. There is aortic atherosclerosis. Mediastinal shadows are otherwise normal. Insignificant azygos fissure. The lungs are clear. No infiltrate, collapse or effusion. Minimal spinal curvature. No significant bone finding. IMPRESSION: No active disease.  No cause of fever identified. Electronically Signed   By: Nelson Chimes M.D.   On: 03/25/2017 14:01   Impression/Plan:  Right breast cancer.  We discussed adjuvant  radiotherapy today.  I recommend 4 weeks of RT (20 fractions)  in order to reduce the risk of locoregional recurrence by 2/3. She understands that 16 treatments given over approximately 3 weeks is the minimum effective treatment I would offer. She declines a boost treatment despite my recommendations to maximize local control  but is amenable to a 16 treatment regimen to the right breast. The risks, benefits and side effects of this treatment were discussed in detail. She understands that radiotherapy is associated with skin irritation and fatigue in the acute setting. Late effects can include cosmetic changes and rare injury to internal organs.  She is in agreement to proceede with treatment given in 16 fractions. A consent form has been signed and placed in her chart. CT Simulation will take place today as  planned, and she will begin RT next week.   A total of 3 medically necessary complex treatment devices will be fabricated and supervised by me: 2 fields with MLCs for custom blocks to protect heart, and lungs;  and, a Vac-lok. MORE COMPLEX DEVICES MAY BE MADE IN DOSIMETRY FOR FIELD IN FIELD BEAMS FOR DOSE HOMOGENEITY.  I have requested : 3D Simulation which is medically necessary to give adequate dose to at risk tissues while sparing lungs and heart.  I have requested a DVH of the following structures: lungs, heart, right lumpectomy cavity.    The patient will receive 42.56 Gy in 16 fractions to the right breast with 2 fields.  This will not be followed by a boost.  I will follow up with Infectious Disease to see if the patient can be worked in for evaluation. (history of parasitic GI infection, treated with holistic measures, unresolved)  Encouraged to continue exercising for well being.  Chest pain was transient and sounds like it was MSK in nature. Let MD know if it worsens.  I spent 45 minutes face to face with the patient and more than 50% of that time was spent in counseling and/or coordination  of care. _____________________________________   Eppie Gibson, MD  This document serves as a record of services personally performed by Eppie Gibson, MD. It was created on her behalf by Maryla Morrow, a trained medical scribe. The creation of this record is based on the scribe's personal observations and the provider's statements to them. This document has been checked and approved by the attending provider.

## 2017-04-02 ENCOUNTER — Other Ambulatory Visit: Payer: Managed Care, Other (non HMO)

## 2017-04-02 ENCOUNTER — Ambulatory Visit: Payer: Managed Care, Other (non HMO) | Admitting: Hematology

## 2017-04-02 ENCOUNTER — Ambulatory Visit: Payer: Managed Care, Other (non HMO)

## 2017-04-02 DIAGNOSIS — Z510 Encounter for antineoplastic radiation therapy: Secondary | ICD-10-CM | POA: Diagnosis not present

## 2017-04-05 ENCOUNTER — Ambulatory Visit: Payer: Managed Care, Other (non HMO)

## 2017-04-08 ENCOUNTER — Encounter: Payer: Self-pay | Admitting: Radiation Oncology

## 2017-04-08 ENCOUNTER — Telehealth: Payer: Self-pay | Admitting: Hematology

## 2017-04-08 ENCOUNTER — Ambulatory Visit
Admission: RE | Admit: 2017-04-08 | Discharge: 2017-04-08 | Disposition: A | Discharge status: Home / Self Care | Payer: Managed Care, Other (non HMO) | Source: Ambulatory Visit | Attending: Radiation Oncology | Admitting: Radiation Oncology

## 2017-04-08 DIAGNOSIS — Z171 Estrogen receptor negative status [ER-]: Secondary | ICD-10-CM | POA: Insufficient documentation

## 2017-04-08 DIAGNOSIS — C50411 Malignant neoplasm of upper-outer quadrant of right female breast: Secondary | ICD-10-CM | POA: Insufficient documentation

## 2017-04-08 DIAGNOSIS — Z5111 Encounter for antineoplastic chemotherapy: Secondary | ICD-10-CM | POA: Diagnosis not present

## 2017-04-08 NOTE — Telephone Encounter (Signed)
R./s appt per patient due to conflicting appt - patient is aware of new appt time.

## 2017-04-08 NOTE — Progress Notes (Signed)
I met with patient upon her request today.  She has a lot of anxiety about RT, which she starts tomorrow. We have had multiple discussions about it. She felt better after we spoke today and discussed the studies that she has seen on the internet about potential side effects.  Upon her request, I asked Iris Pert RN to get her in touch w/ a patient mentor who has had similar disease, and a similar tx course.   -----------------------------------  Eppie Gibson, MD

## 2017-04-09 ENCOUNTER — Ambulatory Visit
Admission: RE | Admit: 2017-04-09 | Discharge: 2017-04-09 | Disposition: A | Discharge status: Home / Self Care | Payer: Managed Care, Other (non HMO) | Source: Ambulatory Visit | Attending: Radiation Oncology | Admitting: Radiation Oncology

## 2017-04-09 DIAGNOSIS — Z87891 Personal history of nicotine dependence: Secondary | ICD-10-CM | POA: Diagnosis not present

## 2017-04-09 DIAGNOSIS — E7800 Pure hypercholesterolemia, unspecified: Secondary | ICD-10-CM | POA: Insufficient documentation

## 2017-04-09 DIAGNOSIS — Z85828 Personal history of other malignant neoplasm of skin: Secondary | ICD-10-CM | POA: Diagnosis not present

## 2017-04-09 DIAGNOSIS — Z171 Estrogen receptor negative status [ER-]: Principal | ICD-10-CM

## 2017-04-09 DIAGNOSIS — Z809 Family history of malignant neoplasm, unspecified: Secondary | ICD-10-CM | POA: Diagnosis not present

## 2017-04-09 DIAGNOSIS — Z510 Encounter for antineoplastic radiation therapy: Secondary | ICD-10-CM | POA: Diagnosis not present

## 2017-04-09 DIAGNOSIS — M810 Age-related osteoporosis without current pathological fracture: Secondary | ICD-10-CM | POA: Diagnosis not present

## 2017-04-09 DIAGNOSIS — R079 Chest pain, unspecified: Secondary | ICD-10-CM | POA: Insufficient documentation

## 2017-04-09 DIAGNOSIS — E039 Hypothyroidism, unspecified: Secondary | ICD-10-CM | POA: Diagnosis not present

## 2017-04-09 DIAGNOSIS — C50411 Malignant neoplasm of upper-outer quadrant of right female breast: Secondary | ICD-10-CM | POA: Insufficient documentation

## 2017-04-09 NOTE — Progress Notes (Signed)
Pt here for patient teaching.  Pt given Radiation and You booklet and skin care instructions.  Reviewed areas of pertinence such as fatigue, skin changes, breast tenderness and breast swelling . Pt able to give teach back of to pat skin, use unscented/gentle soap and drink plenty of water,avoid applying anything to skin within 4 hours of treatment, avoid wearing an under wire bra and to use an electric razor if they must shave. Pt verbalizes understanding of information given and will contact nursing with any questions or concerns.   She declined Radiaplex and Alra deodorant. She will discuss with Dr. Isidore Moos on weekly undertreat day lotions that she would like to use.    [email or web address]/whattoexpect/index

## 2017-04-10 ENCOUNTER — Ambulatory Visit
Admission: RE | Admit: 2017-04-10 | Discharge: 2017-04-10 | Disposition: A | Discharge status: Home / Self Care | Payer: Managed Care, Other (non HMO) | Source: Ambulatory Visit | Attending: Radiation Oncology | Admitting: Radiation Oncology

## 2017-04-10 DIAGNOSIS — Z510 Encounter for antineoplastic radiation therapy: Secondary | ICD-10-CM | POA: Diagnosis not present

## 2017-04-11 ENCOUNTER — Ambulatory Visit
Admission: RE | Admit: 2017-04-11 | Discharge: 2017-04-11 | Disposition: A | Discharge status: Home / Self Care | Payer: Managed Care, Other (non HMO) | Source: Ambulatory Visit | Attending: Radiation Oncology | Admitting: Radiation Oncology

## 2017-04-11 DIAGNOSIS — Z510 Encounter for antineoplastic radiation therapy: Secondary | ICD-10-CM | POA: Diagnosis not present

## 2017-04-14 ENCOUNTER — Ambulatory Visit
Admission: RE | Admit: 2017-04-14 | Discharge: 2017-04-14 | Disposition: A | Discharge status: Home / Self Care | Payer: Managed Care, Other (non HMO) | Source: Ambulatory Visit | Attending: Radiation Oncology | Admitting: Radiation Oncology

## 2017-04-14 ENCOUNTER — Encounter: Payer: Self-pay | Admitting: Radiation Oncology

## 2017-04-14 VITALS — BP 94/65 | HR 91 | Temp 98.1°F | Ht 64.0 in | Wt 108.2 lb

## 2017-04-14 DIAGNOSIS — Z171 Estrogen receptor negative status [ER-]: Principal | ICD-10-CM

## 2017-04-14 DIAGNOSIS — Z510 Encounter for antineoplastic radiation therapy: Secondary | ICD-10-CM | POA: Diagnosis not present

## 2017-04-14 DIAGNOSIS — C50411 Malignant neoplasm of upper-outer quadrant of right female breast: Secondary | ICD-10-CM

## 2017-04-14 NOTE — Progress Notes (Signed)
   Weekly Management Note:  Outpatient    ICD-10-CM   1. Malignant neoplasm of upper-outer quadrant of right breast in female, estrogen receptor negative ([facility]) C50.411    Z17.1     Current Dose:  10.64 Gy  Projected Dose: 42.56 Gy   Narrative:  The patient presents for routine under treatment assessment.  CBCT/MVCT images/Port film x-rays were reviewed.  The chart was checked. Questions about insurance.  No major side effects thus far.  Physical Findings:  height is 5\' 4"  (1.626 m) and weight is 108 lb 3.2 oz (49.1 kg). Her temperature is 98.1 F (36.7 C). Her blood pressure is 94/65 and her pulse is 91. Her oxygen saturation is 98%.   Wt Readings from Last 3 Encounters:  04/14/17 108 lb 3.2 oz (49.1 kg)  04/01/17 108 lb 3.2 oz (49.1 kg)  03/27/17 107 lb 2.3 oz (48.6 kg)   NAD, no skin irritation in RT fields  Impression:  The patient is tolerating radiotherapy.  Plan:  Continue radiotherapy as planned. Patient instructed to apply Radiplex to intact skin in treatment fields - she is very interested in Aloe and Calendula topical so we will see how she tolerates that.  Financial advocate meeting w/ her today.  ________________________________   Eppie Gibson, M.D.

## 2017-04-14 NOTE — Progress Notes (Signed)
Ms. Heitmeyer presents for her 4th fraction of radiation to her Right Breast. She denies pain or fatigue. She denies skin changes at this time. She declined radiaplex and is using aloe and calendula cream twice daily. She has several insurance questions today.   BP 94/65   Pulse 91   Temp 98.1 F (36.7 C)   Ht 5\' 4"  (1.626 m)   Wt 108 lb 3.2 oz (49.1 kg)   SpO2 98% Comment: room air  BMI 18.57 kg/m    Wt Readings from Last 3 Encounters:  04/14/17 108 lb 3.2 oz (49.1 kg)  04/01/17 108 lb 3.2 oz (49.1 kg)  03/27/17 107 lb 2.3 oz (48.6 kg)

## 2017-04-15 ENCOUNTER — Ambulatory Visit
Admission: RE | Admit: 2017-04-15 | Discharge: 2017-04-15 | Disposition: A | Discharge status: Home / Self Care | Payer: Managed Care, Other (non HMO) | Source: Ambulatory Visit | Attending: Radiation Oncology | Admitting: Radiation Oncology

## 2017-04-15 DIAGNOSIS — Z510 Encounter for antineoplastic radiation therapy: Secondary | ICD-10-CM | POA: Diagnosis not present

## 2017-04-16 ENCOUNTER — Ambulatory Visit
Admission: RE | Admit: 2017-04-16 | Discharge: 2017-04-16 | Disposition: A | Discharge status: Home / Self Care | Payer: Managed Care, Other (non HMO) | Source: Ambulatory Visit | Attending: Radiation Oncology | Admitting: Radiation Oncology

## 2017-04-16 DIAGNOSIS — Z5111 Encounter for antineoplastic chemotherapy: Secondary | ICD-10-CM | POA: Diagnosis not present

## 2017-04-17 ENCOUNTER — Ambulatory Visit
Admission: RE | Admit: 2017-04-17 | Discharge: 2017-04-17 | Disposition: A | Discharge status: Home / Self Care | Payer: Managed Care, Other (non HMO) | Source: Ambulatory Visit | Attending: Radiation Oncology | Admitting: Radiation Oncology

## 2017-04-17 DIAGNOSIS — Z510 Encounter for antineoplastic radiation therapy: Secondary | ICD-10-CM | POA: Diagnosis not present

## 2017-04-18 ENCOUNTER — Ambulatory Visit
Admission: RE | Admit: 2017-04-18 | Discharge: 2017-04-18 | Disposition: A | Discharge status: Home / Self Care | Payer: Managed Care, Other (non HMO) | Source: Ambulatory Visit | Attending: Radiation Oncology | Admitting: Radiation Oncology

## 2017-04-18 DIAGNOSIS — Z510 Encounter for antineoplastic radiation therapy: Secondary | ICD-10-CM | POA: Diagnosis not present

## 2017-04-21 ENCOUNTER — Telehealth: Payer: Self-pay | Admitting: *Deleted

## 2017-04-21 ENCOUNTER — Ambulatory Visit
Admission: RE | Admit: 2017-04-21 | Discharge: 2017-04-21 | Disposition: A | Discharge status: Home / Self Care | Payer: Managed Care, Other (non HMO) | Source: Ambulatory Visit | Attending: Radiation Oncology | Admitting: Radiation Oncology

## 2017-04-21 DIAGNOSIS — Z510 Encounter for antineoplastic radiation therapy: Secondary | ICD-10-CM | POA: Diagnosis not present

## 2017-04-21 NOTE — Telephone Encounter (Signed)
Called patient to update about infectious disease appt. , spoke with patient and she should know something about an appt. tomorrow

## 2017-04-22 ENCOUNTER — Ambulatory Visit
Admission: RE | Admit: 2017-04-22 | Discharge: 2017-04-22 | Disposition: A | Discharge status: Home / Self Care | Payer: Managed Care, Other (non HMO) | Source: Ambulatory Visit | Attending: Radiation Oncology | Admitting: Radiation Oncology

## 2017-04-22 DIAGNOSIS — Z510 Encounter for antineoplastic radiation therapy: Secondary | ICD-10-CM | POA: Diagnosis not present

## 2017-04-23 ENCOUNTER — Ambulatory Visit
Admission: RE | Admit: 2017-04-23 | Discharge: 2017-04-23 | Disposition: A | Discharge status: Home / Self Care | Payer: Managed Care, Other (non HMO) | Source: Ambulatory Visit | Attending: Radiation Oncology | Admitting: Radiation Oncology

## 2017-04-23 DIAGNOSIS — Z510 Encounter for antineoplastic radiation therapy: Secondary | ICD-10-CM | POA: Diagnosis not present

## 2017-04-24 ENCOUNTER — Ambulatory Visit
Admission: RE | Admit: 2017-04-24 | Discharge: 2017-04-24 | Disposition: A | Discharge status: Home / Self Care | Payer: Managed Care, Other (non HMO) | Source: Ambulatory Visit | Attending: Radiation Oncology | Admitting: Radiation Oncology

## 2017-04-24 ENCOUNTER — Telehealth: Payer: Self-pay | Admitting: *Deleted

## 2017-04-24 DIAGNOSIS — Z510 Encounter for antineoplastic radiation therapy: Secondary | ICD-10-CM | POA: Diagnosis not present

## 2017-04-24 NOTE — Telephone Encounter (Signed)
CALLED PATIENT TO UPDATE ABOUT AN INFECTON APPT, WAS TOLD BY INFECTION OFFICE, THAT HER APPT. IS PENDING DUE TO BEING IN REVIEW,LVM FOR A RETURN CALL

## 2017-04-25 ENCOUNTER — Ambulatory Visit
Admission: RE | Admit: 2017-04-25 | Discharge: 2017-04-25 | Disposition: A | Discharge status: Home / Self Care | Payer: Managed Care, Other (non HMO) | Source: Ambulatory Visit | Attending: Radiation Oncology | Admitting: Radiation Oncology

## 2017-04-25 DIAGNOSIS — Z510 Encounter for antineoplastic radiation therapy: Secondary | ICD-10-CM | POA: Diagnosis not present

## 2017-04-28 ENCOUNTER — Ambulatory Visit
Admission: RE | Admit: 2017-04-28 | Discharge: 2017-04-28 | Disposition: A | Discharge status: Home / Self Care | Payer: Managed Care, Other (non HMO) | Source: Ambulatory Visit | Attending: Radiation Oncology | Admitting: Radiation Oncology

## 2017-04-28 DIAGNOSIS — Z510 Encounter for antineoplastic radiation therapy: Secondary | ICD-10-CM | POA: Diagnosis not present

## 2017-04-29 ENCOUNTER — Telehealth: Payer: Self-pay | Admitting: *Deleted

## 2017-04-29 ENCOUNTER — Ambulatory Visit
Admission: RE | Admit: 2017-04-29 | Discharge: 2017-04-29 | Disposition: A | Discharge status: Home / Self Care | Payer: Managed Care, Other (non HMO) | Source: Ambulatory Visit | Attending: Radiation Oncology | Admitting: Radiation Oncology

## 2017-04-29 ENCOUNTER — Encounter: Payer: Self-pay | Admitting: Radiation Oncology

## 2017-04-29 DIAGNOSIS — Z510 Encounter for antineoplastic radiation therapy: Secondary | ICD-10-CM | POA: Diagnosis not present

## 2017-04-29 NOTE — Telephone Encounter (Signed)
CALLED PATIENT TO INFORM THAT INFECTION DR. HAS DECLINED TO SEE HER, AFTER HE REVIEWED HER NOTES, HE SEES NOTHING TO SUGGEST THAT SHE HAS PARASITES, LVM FOR A RETURN CALL

## 2017-04-29 NOTE — Progress Notes (Signed)
I was called to the [facility] today and informed that Paula [name] was refusing ports to confirm her positioning on the table. I met with the patient in the vault and explained the purpose  of ports for safety reasons and accuracy.  She expressed concerned about too much radiation dose.  I explained to her the various reasons that I think the benefits of porting her today outweigh the risks.  She remained concerned. She continued to refuse.  I spoke with BJ Sintay our physicist to confirm that the patient can be treated without ports.  He stated it was okay as long as I gave permission as the physician.  Given that the patient refused ports against medical advice,  I felt that it was still in her best interest to proceed with curative radiotherapy through her final fractions. She understands it is not standard to defer ports and that there are risks in potential slight inaccuracies in the delivery of RT without the xray confirmation of position.  -----------------------------------  Eppie Gibson, MD

## 2017-05-01 ENCOUNTER — Ambulatory Visit
Admission: RE | Admit: 2017-05-01 | Discharge: 2017-05-01 | Disposition: A | Discharge status: Home / Self Care | Payer: Managed Care, Other (non HMO) | Source: Ambulatory Visit | Attending: Radiation Oncology | Admitting: Radiation Oncology

## 2017-05-01 DIAGNOSIS — Z510 Encounter for antineoplastic radiation therapy: Secondary | ICD-10-CM | POA: Diagnosis not present

## 2017-05-09 ENCOUNTER — Encounter: Payer: Self-pay | Admitting: Radiation Oncology

## 2017-05-09 NOTE — Progress Notes (Signed)
  Radiation Oncology         [phone] ________________________________  Name: Paula [name] MRN: [id number]  Date: 05/09/2017  DOB: Jul 14, 1956  End of Treatment Note  Diagnosis:   Stage I Right Breast UOQ Invasive Ductal Carcinoma, ERneg / PRneg / Her2neg, Grade 3  Indication for treatment:  Curative       Radiation treatment dates:   04/09/17 - 05/01/17  Site/dose:   Right Breast treated to 42.56 Gy in 16 fractions  Beams/energy:   3D  //  6X  Narrative: The patient tolerated radiation treatment relatively well. The patient experienced radiation related skin changes including slight erythema over right breast; skin remained intact throughout treatment.  Plan: The patient has completed radiation treatment. The patient will return to radiation oncology clinic for routine followup in one month. I advised them to call or return sooner if they have any questions or concerns related to their recovery or treatment.  -----------------------------------  Eppie Gibson, MD  This document serves as a record of services personally performed by Eppie Gibson, MD. It was created on her behalf by Maryla Morrow, a trained medical scribe. The creation of this record is based on the scribe's personal observations and the provider's statements to them. This document has been checked and approved by the attending provider.

## 2017-05-29 NOTE — Progress Notes (Signed)
[name]  Telephone:([phone]) [phone] Fax:(336) [phone]  Clinic Follow Up Note   Patient Care Team: Paula Rasmussen, MD as PCP - General (Family Medicine)   Oncology History   Cancer Staging Malignant neoplasm of upper outer quadrant of female breast [facility]) Staging form: Breast, AJCC 8th Edition - Clinical: No stage assigned - Unsigned - Pathologic: pT1b, pN0, cM0, ER: Negative, PR: Negative, HER2: Negative - Signed by Paula Gibson, MD on 01/08/2017       Basal cell carcinoma of skin   03/30/2014 Initial Diagnosis    Basal cell carcinoma of skin      Breast cancer of upper-outer quadrant of right female breast ([place])   02/01/2016 Mammogram    Breast density category B. There are new punctate calcification at the 11:00 position of right breast, highly suspicious for malignancy. Patient refused biopsy      12/05/2016 Initial Diagnosis    Malignant neoplasm of upper outer quadrant of female breast ([place])      12/05/2016 Surgery    Right breast lumpectomy and sentinel lymph node biopsy by Dr. Ernst [name] at [facility]       12/05/2016 Pathology Results    Right breast lumpectomy and a sentinel lymph node biopsy showed invasive ductal carcinoma, multifocal, tumor size 9 mm, 7 mm and 3 mm. Grade 3. High-grade DCIS with microcalcifications. Final margins were negative for invasive and DCIS. 2 sentinel lymph nodes were negative.      12/05/2016 Receptors her2    ER, PR and HER-2 triple negative      01/22/2017 Imaging    Bone Density  ASSESSMENT: The BMD measured at AP Spine L1-L4 is 0.766 g/cm2 with a T-score of -3.5. This patient is considered OSTEOPOROTIC according to [facility]) criteria. There has been a statistically significant decrease in BMD of left hip since exam dated 03/12/2012. Spine was not compared to prior study due to exclusion of vertebral bodies.      01/29/2017 - 03/12/2017 Adjuvant Chemotherapy    Docetaxel and Cytoxan every 3 weeks  starting 01/29/17. Neulasta given 2 days after chemo. She received 3 cycles. Last cycle was canceled by patient due to the concerns of long-term side effects.      03/25/2017 - 03/27/2017 Hospital Admission    Admit date: 03/25/17 Admission diagnosis: Neutropenic Fever  Additional comments: resolved with granix      04/09/2017 - 05/01/2017 Radiation Therapy          CHIEF COMPLAINTS:  Follow up right triple negative breast cancer   HISTORY OF PRESENTING ILLNESS:  Paula [name] 61 y.o. female is here because of recently diagnosed right multifocal ductal carcinoma, triple negative.  She is s/p right lumpectomy and node biopsy on 12/05/2016. She was referred by her radiation oncologist Dr. Isidore [name]. She presents to my clinic by herself today.  The patient had a routine mammogram on 01/2016. The findings demonstrated a group of indeterminate microcalcification over the upper outer right breast for which  A biopsy was recommended, which was declined at the time. The patient opted to pursue a thermogram instead that was reported as normal. A few months after, while walking her dog, and felt pain in her arm. A repeat thermogram was done and showed an abnormality on the opposite side. She then decided to repeat a mammogram.   Repeat mammogram on 10/25/16 showed an irregular 7 mm mass over the posterior third of the upper right breast with few associated microcalcifications, as this mass lies along  the posterior edge of the previously described segmental microclacifcations. Targeted ultrasound showed bilobed hypoechoic mass with mild border irregularly over the 10:30 position of the right breast 9 cm from the nipple corresponding to the mass with the border irregularity.  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  Right lumpectomy and sentinel node dissection on  12/05/16 with Dr. Ernst [name] revealed multifocal breast cancer - pT1b, pN0.  The patient presents today to discuss the roll that chemotherapy may have to help with her disease. Patient does not live in [place], but in [place] She reports her sisters have experienced the same thing, which has resulted in [place], Stage 0. She is here to get another opinion, due to her cancer being triple negative. She denies any pain, swelling and has full ROM. Patient does yoga and walk to help. Also denies fever. Patient does not smoke. She does not want to take calcium because she thinks it may have cause colon polyps.   Patient reports history of parasites which was found 20 years ago and tests are still positive. She also has history of EBV virus infection.  Patient is strongly believe homeopathic and alternative medicine, she takes multiple vitamin and herbal supplements.  CURRENT THERAPY: Surveillance  INTERVAL HISTORY: Paula [name] returns for a follow up. She completed radiation therapy on 7/5 and denies any associated problems. She recently began taking a type of coffee enema to help detox her body.   She admits to hemorrhoids but denies any abnormal bleeding. She also has some "tightening" of her right flank. She has been trying alternative health methods and would like to try vitamin C IV.   REVIEW OF SYSTEMS:   Constitutional: Denies fevers, chills or abnormal night sweats,  Eyes: Denies blurriness of vision, double vision or watery eyes Ears, nose, mouth, throat, and face: Denies mucositis or sore throat Respiratory: Denies cough, dyspnea or wheezes Cardiovascular: Denies palpitation, chest discomfort or lower extremity swelling Gastrointestinal:  Denies nausea, heartburn  Skin: no rash Lymphatics: Denies new lymphadenopathy or easy bruising Neurological:Denies numbness, tingling or new weaknesses Behavioral/Psych: Mood is stable, no new changes All other systems were reviewed with  the patient and are negative.   MEDICAL HISTORY:  Past Medical History:  Diagnosis Date  . Candida infection    in bowels  . 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 [name] infection   . History of radiation therapy 04/09/17- 05/01/17   Right Breast 42.56 Gy in 16 fractions  . Hypercholesteremia   . Hypothyroid   . Osteoporosis   . Skin cancer    right side of face basal cell    SURGICAL HISTORY: Past Surgical History:  Procedure Laterality Date  . basal cell skin cancer     to right side of face two times  . BREAST SURGERY     right breast biopsy  . TONSILLECTOMY     as a child    SOCIAL HISTORY: Social History   Social History  . Marital status: Married    Spouse name: N/A  . Number of children: N/A  . Years of education: N/A   Occupational History  . Not on file.   Social History Main Topics  . Smoking status: Former Smoker    Packs/day: 0.25    Years: 2.00    Quit date: 03/30/1976  . Smokeless tobacco: Never Used  Comment: she smoked socially as a teenager  . Alcohol use Yes     Comment: rare wine use  . Drug use: No  . Sexual activity: Yes    Birth control/ protection: Post-menopausal   Other Topics Concern  . Not on file   Social History Narrative  . No narrative on file    FAMILY HISTORY: Family History  Problem Relation Age of Onset  . Cancer Mother 4       breast  . Cancer Father        pancreatic  . Cancer Sister 53       breast  . Heart disease Maternal Grandfather   . Cancer Paternal Grandmother 63       lymphoma  . Cancer Paternal Grandfather 60       stomach  . Cancer Maternal Grandmother 95       breast cancer   . Cancer Sister 60       breast cancer   2 children, not biological  ALLERGIES:  has No Known Allergies.  MEDICATIONS:  Current Outpatient Prescriptions  Medication Sig Dispense Refill  . ARMOUR THYROID 30 MG tablet 1 1/2 TABLET EVERY DAY  3  . Ascorbic Acid (VITAMIN C) 100 MG tablet Take  100 mg by mouth 2 (two) times daily.    . cholecalciferol (VITAMIN D) 1000 UNITS tablet Take 1,000 Units by mouth daily.    Marland Kitchen co-enzyme Q-10 50 MG capsule Take 50 mg by mouth.    . Digestive Enzymes (BETAINE HCL) 650-130 MG CAPS Take 1 capsule by mouth 3 (three) times daily with meals. Betaine HCL [place]    . Garlic 10 MG CAPS Take by mouth. Dosing unknown    . GLUTAMINE PO Take by mouth. For leaky gut    . Homeopathic Products (FRANKINCENSE UPLIFTING) OIL Inhale into the lungs.    . IODINE, KELP, PO Take by mouth. ? Dose 26m?    .Marland KitchenMAGNESIUM CITRATE PO Take 200 mg by mouth 2 (two) times daily.    . Melatonin 3-2 MG TABS Take by mouth every evening. Actual dose is 3-661m   . niacin 50 MG tablet Take 50 mg by mouth at bedtime.    . OIL OF OREGANO PO Take by mouth 3 (three) times daily with meals.    . ondansetron (ZOFRAN) 8 MG tablet Take 1 tablet (8 mg total) by mouth 2 (two) times daily as needed for refractory nausea / vomiting. Start on day 3 after chemo. (Patient not taking: Reported on 04/01/2017) 30 tablet 1  . prochlorperazine (COMPAZINE) 10 MG tablet Take 1 tablet (10 mg total) by mouth every 6 (six) hours as needed (Nausea or vomiting). (Patient not taking: Reported on 04/01/2017) 30 tablet 1  . Selenium (V-R SELENIUM) 200 MCG TABS Take by mouth.    . TURMERIC PO Take 250 mg by mouth daily.    . vitamin E (VITAMIN E) 200 UNIT capsule Take 200 Units by mouth 2 (two) times daily.    . Marland KitchenITAMIN K PO Take 2 mg by mouth daily.    . Marland KitchenINC ASPARTATE PO Take 15 mg by mouth daily. For leaky gut     No current facility-administered medications for this visit.     PHYSICAL EXAMINATION:  ECOG PERFORMANCE STATUS: 0 BP 109/69 (BP Location: Left Arm, Patient Position: Sitting)   Pulse 75   Temp 98.6 F (37 C) (Oral)   Resp 18   Ht '5\' 4"'  (  1.626 m)   Wt 107 lb 11.2 oz (48.9 kg)   SpO2 100%   BMI 18.49 kg/m  GENERAL:alert, no distress and comfortable SKIN: skin color,  texture, turgor are normal, no rashes or significant lesions EYES: normal, conjunctiva are pink and non-injected, sclera clear OROPHARYNX:no exudate, no erythema and lips, buccal mucosa, and tongue normal  NECK: supple, thyroid normal size, non-tender, without nodularity LYMPH:  no palpable lymphadenopathy in the cervical, axillary or inguinal LUNGS: clear to auscultation and percussion with normal breathing effort HEART: regular rate & rhythm and no murmurs and no lower extremity edema ABDOMEN:abdomen soft, non-tender and normal bowel sounds Musculoskeletal:no cyanosis of digits and no clubbing  PSYCH: alert & oriented x 3 with fluent speech NEURO: no focal motor/sensory deficits BREAST: The incision in right breast above areola well healed. No palpable mass or adenopathy. Nipple pink, no erythema.  LABORATORY DATA:  I have reviewed the data as listed CBC Latest Ref Rng & Units 05/30/2017 03/31/2017 03/27/2017  WBC 3.9 - 10.3 10e3/uL 6.1 11.2(H) 9.0  Hemoglobin 11.6 - 15.9 g/dL 12.1 10.8(L) 8.7(L)  Hematocrit 34.8 - 46.6 % 38.1 33.8(L) 27.3(L)  Platelets 145 - 400 10e3/uL 202 347 236   CMP Latest Ref Rng & Units 05/30/2017 03/31/2017 03/27/2017  Glucose 70 - 140 mg/dl 80 89 85  BUN 7.0 - 26.0 mg/dL 14.0 14.3 10  Creatinine 0.6 - 1.1 mg/dL 0.7 0.7 0.50  Sodium 136 - 145 mEq/L 135(L) 140 140  Potassium 3.5 - 5.1 mEq/L 4.1 4.3 4.1  Chloride 101 - 111 mmol/L - - 109  CO2 22 - 29 mEq/L '28 27 24  ' Calcium 8.4 - 10.4 mg/dL 9.8 10.0 8.7(L)  Total Protein 6.4 - 8.3 g/dL 7.2 6.9 -  Total Bilirubin 0.20 - 1.20 mg/dL 0.24 0.23 -  Alkaline Phos 40 - 150 U/L 100 87 -  AST 5 - 34 U/L 24 38(H) -  ALT 0 - 55 U/L 20 40 -   PATHOLOGY REPORT  See onc history   RADIOGRAPHIC STUDIES: I have personally reviewed the radiological images as listed and agreed with the findings in the report. No results found.  ASSESSMENT & PLAN: 61 y.o. Caucasian female, postmenopausal  1. Malignant neoplasm of upper-outer  quadrant of right breast, invasive ductal carcinoma with high grade DCIS, pT1bN0M0 stage IA, triple negative -I reviewed her outside scan reports, surgical pathology results and discussed with patient in details. -She had early stage breast cancer, had a complete surgical resection. -she was reluctant to take adjuvant chemo, and finally has started TC, plan for 4 cycles -Patient takes multiple vitamins and herbs supplements, I advised the patient not tohold herbal supplements during her chemo -She tolerated chemotherapy well.  -She declined Neulasta after this cycle chemotherapy, she was admitted for neutropenic fever after chemo, ID workup was negative. Her neutropenic resolved with 1 dose of Granix. -She was previously very concerned about long-term side effects from chemotherapy, especially the small risk of leukemia and MDS. I tried to encourage her to take the last cycle chemotherapy this week, and explained to her that the risk is small. I did state that the benefit of last cycle chemo is probably less than the first few cycles. After lengthy discussion, patient decided not to pursue the last cycle chemotherapy. -She has completed adjuvant radiation therapy on 05/01/2017  - I recommend close follow-up every 3-4 months for the first 2 years, then every 6 months for total 5 years. - Labs reviewed and within normal  ranges. She is clinically doing well. Physical exam was unremarkable, I have no clinical concern for recurrence. -Continue surveillance. -I encouraged her to continue healthy diet, and exercise regularly. -She is interested in alternative medicine, such as vitamin C infusion. We discussed that there is no sufficient to send her for evidence to support vitamin C infusion in cancer patients, and that I would not offer her.   2. Genetic Testing - Mother and two sisters with breast cancer, all tested negative. Sister with VUS in BRCA2. 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 - Father had pancreatic cancer .  3. Osteoporosis of spine  -Bone scan on 01/22/17 revealed worsening osteoporosis, with T score of -3.5, she is at high risk for fracture. -I advised the patient to continue calcium and Vitamin D supplementation. -I recommend her to consider biphosphonate, such as oral Fosamax or iv zometa, to improve her bone density. I will provide print out information and she is not very interested but will think about it for now - she will follow up with PCP   Plan:   -Continue surveillance. I'll see her back with lab in 5 months for follow up.    No orders of the defined types were placed in this encounter.   All questions were answered. The patient knows to call the clinic with any problems, questions or concerns.  I spent 20 minutes counseling the patient face to face. The total time spent in the appointment was 30 minutes and more than 50% was on counseling.   This document serves as a record of services personally performed by Truitt Merle, MD. It was created on her behalf by Brandt Loosen, a trained medical scribe. The creation of this record is based on the scribe's personal observations and the provider's statements to them. This document has been checked and approved by the attending provider.   Truitt Merle, MD 05/30/2017

## 2017-05-30 ENCOUNTER — Encounter: Payer: Self-pay | Admitting: Radiation Oncology

## 2017-05-30 ENCOUNTER — Other Ambulatory Visit: Payer: Managed Care, Other (non HMO)

## 2017-05-30 ENCOUNTER — Encounter: Payer: Self-pay | Admitting: Hematology

## 2017-05-30 ENCOUNTER — Ambulatory Visit: Payer: Managed Care, Other (non HMO) | Admitting: Hematology

## 2017-05-30 ENCOUNTER — Other Ambulatory Visit (HOSPITAL_BASED_OUTPATIENT_CLINIC_OR_DEPARTMENT_OTHER): Payer: Managed Care, Other (non HMO)

## 2017-05-30 ENCOUNTER — Ambulatory Visit (HOSPITAL_BASED_OUTPATIENT_CLINIC_OR_DEPARTMENT_OTHER): Payer: Managed Care, Other (non HMO) | Admitting: Hematology

## 2017-05-30 VITALS — BP 109/69 | HR 75 | Temp 98.6°F | Resp 18 | Ht 64.0 in | Wt 107.7 lb

## 2017-05-30 DIAGNOSIS — Z171 Estrogen receptor negative status [ER-]: Secondary | ICD-10-CM

## 2017-05-30 DIAGNOSIS — C50411 Malignant neoplasm of upper-outer quadrant of right female breast: Secondary | ICD-10-CM

## 2017-05-30 LAB — CBC WITH DIFFERENTIAL/PLATELET
BASO%: 0.3 % (ref 0.0–2.0)
BASOS ABS: 0 10*3/uL (ref 0.0–0.1)
EOS ABS: 0.1 10*3/uL (ref 0.0–0.5)
EOS%: 1 % (ref 0.0–7.0)
HEMATOCRIT: 38.1 % (ref 34.8–46.6)
HEMOGLOBIN: 12.1 g/dL (ref 11.6–15.9)
LYMPH#: 2.4 10*3/uL (ref 0.9–3.3)
LYMPH%: 39.7 % (ref 14.0–49.7)
MCH: 26.5 pg (ref 25.1–34.0)
MCHC: 31.8 g/dL (ref 31.5–36.0)
MCV: 83.4 fL (ref 79.5–101.0)
MONO#: 0.6 10*3/uL (ref 0.1–0.9)
MONO%: 9.1 % (ref 0.0–14.0)
NEUT%: 49.9 % (ref 38.4–76.8)
NEUTROS ABS: 3 10*3/uL (ref 1.5–6.5)
Platelets: 202 10*3/uL (ref 145–400)
RBC: 4.57 10*6/uL (ref 3.70–5.45)
RDW: 15.9 % — AB (ref 11.2–14.5)
WBC: 6.1 10*3/uL (ref 3.9–10.3)

## 2017-05-30 LAB — COMPREHENSIVE METABOLIC PANEL
ALBUMIN: 4.2 g/dL (ref 3.5–5.0)
ALK PHOS: 100 U/L (ref 40–150)
ALT: 20 U/L (ref 0–55)
AST: 24 U/L (ref 5–34)
Anion Gap: 8 mEq/L (ref 3–11)
BILIRUBIN TOTAL: 0.24 mg/dL (ref 0.20–1.20)
BUN: 14 mg/dL (ref 7.0–26.0)
CALCIUM: 9.8 mg/dL (ref 8.4–10.4)
CO2: 28 mEq/L (ref 22–29)
Chloride: 99 mEq/L (ref 98–109)
Creatinine: 0.7 mg/dL (ref 0.6–1.1)
Glucose: 80 mg/dl (ref 70–140)
Potassium: 4.1 mEq/L (ref 3.5–5.1)
Sodium: 135 mEq/L — ABNORMAL LOW (ref 136–145)
TOTAL PROTEIN: 7.2 g/dL (ref 6.4–8.3)

## 2017-06-02 ENCOUNTER — Telehealth: Payer: Self-pay

## 2017-06-02 NOTE — Telephone Encounter (Signed)
Called patient with upcoming apptoitment for 10/31/17. Left a voice message.

## 2017-06-06 ENCOUNTER — Encounter: Payer: Self-pay | Admitting: Radiation Oncology

## 2017-06-06 ENCOUNTER — Ambulatory Visit
Admission: RE | Admit: 2017-06-06 | Discharge: 2017-06-06 | Disposition: A | Discharge status: Home / Self Care | Payer: Managed Care, Other (non HMO) | Source: Ambulatory Visit | Attending: Radiation Oncology | Admitting: Radiation Oncology

## 2017-06-06 DIAGNOSIS — C50411 Malignant neoplasm of upper-outer quadrant of right female breast: Secondary | ICD-10-CM | POA: Diagnosis not present

## 2017-06-06 DIAGNOSIS — Z171 Estrogen receptor negative status [ER-]: Secondary | ICD-10-CM | POA: Insufficient documentation

## 2017-06-06 HISTORY — DX: Personal history of irradiation: Z92.3

## 2017-06-06 NOTE — Progress Notes (Signed)
Paula [name] presents for follow up of radiation completed 05/01/17 to her Right Breast. She denies pain currently. She does report occasional twinges to her Right Breast. She denies fatigue. Her radiation site as healed, and she is not using any cream at this time. She reports increased gas since having radiation and chemotherapy. She does report a diet high in salads.   BP 116/75   Pulse 75   Temp 98.4 F (36.9 C)   Wt 110 lb 6.4 oz (50.1 kg)   SpO2 100% Comment: room air  BMI 18.95 kg/m    Wt Readings from Last 3 Encounters:  06/06/17 110 lb 6.4 oz (50.1 kg)  05/30/17 107 lb 11.2 oz (48.9 kg)  04/14/17 108 lb 3.2 oz (49.1 kg)

## 2017-06-06 NOTE — Progress Notes (Signed)
Radiation Oncology         [phone] ________________________________  Name: Paula [name] MRN: [id number]  Date: 06/06/2017  DOB: 11-12-1955  Follow-Up Visit Note  Outpatient  CC: Hayden Rasmussen, MD  Truitt Merle, MD  Diagnosis and Prior Radiotherapy:    ICD-10-CM   1. Malignant neoplasm of upper-outer quadrant of right breast in female, estrogen receptor negative ([place]) C50.411    Z17.1      Stage I Right Breast UOQ Invasive Ductal Carcinoma, ERneg / PRneg / Her2neg, Grade 3  CHIEF COMPLAINT: Here for follow-up and surveillance of right breast cancer  Narrative:  The patient returns today for routine follow-up.  Pt completed treatment on 05/01/17. She notes that their are no issues with her skin and she hasn't used any radiaplex to her skin following completion of her treatment. Pt notes that she used a different lotion during her treatments. Pt reports intermittent "twinges" to her right breast. Pt saw Dr. Burr Medico last week and was informed that she will need a follow up appointment in January 2019.  On review of systems, pt reports intermittent "twinges" to her right breast. Pt reports right arm pain.                    ALLERGIES:  has No Known Allergies.  Meds: Current Outpatient Prescriptions  Medication Sig Dispense Refill  . ARMOUR THYROID 30 MG tablet 1 1/2 TABLET EVERY DAY  3  . Ascorbic Acid (VITAMIN C) 100 MG tablet Take 100 mg by mouth 2 (two) times daily.    . cholecalciferol (VITAMIN D) 1000 UNITS tablet Take 1,000 Units by mouth daily.    Marland Kitchen co-enzyme Q-10 50 MG capsule Take 50 mg by mouth.    . Digestive Enzymes (BETAINE HCL) 650-130 MG CAPS Take 1 capsule by mouth 3 (three) times daily with meals. Betaine HCL [place]    . Garlic 10 MG CAPS Take by mouth. Dosing unknown    . IODINE, KELP, PO Take by mouth. ? Dose 6mg ?    . Melatonin 3-2 MG TABS Take by mouth every evening. Actual dose is 3-6mg     . niacin 50 MG tablet Take 50 mg by mouth at  bedtime.    . OIL OF OREGANO PO Take by mouth 3 (three) times daily with meals.    . Selenium (V-R SELENIUM) 200 MCG TABS Take by mouth.    . TURMERIC PO Take 250 mg by mouth daily.    Marland Kitchen UNABLE TO FIND Med Name: mushrooms    . vitamin E (VITAMIN E) 200 UNIT capsule Take 200 Units by mouth 2 (two) times daily.    Marland Kitchen VITAMIN K PO Take 2 mg by mouth daily.    Marland Kitchen ZINC ASPARTATE PO Take 15 mg by mouth daily. For leaky gut    . GLUTAMINE PO Take by mouth. For leaky gut    . Homeopathic Products (FRANKINCENSE UPLIFTING) OIL Inhale into the lungs.    Marland Kitchen MAGNESIUM CITRATE PO Take 200 mg by mouth 2 (two) times daily.    . ondansetron (ZOFRAN) 8 MG tablet Take 1 tablet (8 mg total) by mouth 2 (two) times daily as needed for refractory nausea / vomiting. Start on day 3 after chemo. (Patient not taking: Reported on 04/01/2017) 30 tablet 1  . prochlorperazine (COMPAZINE) 10 MG tablet Take 1 tablet (10 mg total) by mouth every 6 (six) hours as needed (Nausea or vomiting). (Patient not taking: Reported on  04/01/2017) 30 tablet 1   No current facility-administered medications for this encounter.     Physical Findings: The patient is in no acute distress. Patient is alert and oriented.  weight is 110 lb 6.4 oz (50.1 kg). Her temperature is 98.4 F (36.9 C). Her blood pressure is 116/75 and her pulse is 75. Her oxygen saturation is 100%. .  Breast: Right breast slightly tanner with erythema over right nipple. Skin healed very well. Mild swelling of right breast.    Lab Findings: Lab Results  Component Value Date   WBC 6.1 05/30/2017   HGB 12.1 05/30/2017   HCT 38.1 05/30/2017   MCV 83.4 05/30/2017   PLT 202 05/30/2017      Radiographic Findings: No results found.  Impression/Plan:  Pt is healing well from radiation and chemotherapy.  I advised the pt to use vitamin E oil or lotions with vitamin E oil to aid in alleviating radiation related skin changes to right breast for the next couple of  months.  I discussed with the pt the need for annual mammograms due to previous hx.   I advise that the pt keep follow up appointment with Dr. Burr Medico in January 2019 for continued surveillance. I will follow up with the patient PRN.        Eppie Gibson, MD   This document serves as a record of services personally performed by Eppie Gibson, MD. It was created on her behalf by Steva Colder, a trained medical scribe. The creation of this record is based on the scribe's personal observations and the provider's statements to them. This document has been checked and approved by the attending provider.

## 2017-06-12 ENCOUNTER — Telehealth: Payer: Self-pay | Admitting: *Deleted

## 2017-06-12 NOTE — Telephone Encounter (Signed)
Thu,  Please follow up with her later today or tomorrow. I will let her PCP handle this for now.   Thanks.   Truitt Merle MD

## 2017-06-12 NOTE — Telephone Encounter (Signed)
"  What route do I need to go, who do I see about rectal bleeding?  Seeing GP this morning, wants stomach xrays, I don't want anymore radiation exposure.  I started coffee enemas three times weekly to detox liver.  Eliminate water with floating stool.  Today, only water with a long pause so I tried a Cat Cow yoga pose.  Then eliminated more water with floating stool but water tinted red.  Commode water looks like a pink juice.  Leaving now to see GP, return call to my mobile [phone].  I always have intermittent discomfort to right abdomen dependent on how much I eat.  Drink at least a gallon of water and juices daily.  Stools often float, look like balls from fat so I try to eat healthy.  No swelling, bloating, tenderness, nausea.  Red blood on tissue with my history of hemorrhoids and polyps.  2015 colonoscopy did not show any polyps.  Could I have a referral to local GI?  I Do not wish to keep my current GI in [facility]."    Routing call information to collaborative nurse and provider for review.  Further patient communication through collaborative nurse.  This nurse encouraged 64 oz water daily in addition to other daily beverages.

## 2017-06-13 NOTE — Telephone Encounter (Signed)
Spoke with pt and was informed re:  Pt was seen by her PCP yesterday, and was informed that pt has internal hemorrhoids.  Bleeding comes from coffee enema that pt has been doing to detox her liver.  Pt was given cortisone cream to apply.  No bleeding noted today per pt.

## 2017-07-31 ENCOUNTER — Other Ambulatory Visit: Payer: Self-pay | Admitting: Hematology

## 2017-07-31 ENCOUNTER — Telehealth: Payer: Self-pay | Admitting: *Deleted

## 2017-07-31 DIAGNOSIS — Z171 Estrogen receptor negative status [ER-]: Principal | ICD-10-CM

## 2017-07-31 DIAGNOSIS — C50411 Malignant neoplasm of upper-outer quadrant of right female breast: Secondary | ICD-10-CM

## 2017-07-31 NOTE — Telephone Encounter (Signed)
Pt called requesting a call back from nurse.  Spoke with pt, and was informed that pt would like a referral to PT after attending [facility] support class.   Pt also experiences some Right arm pain off and on  Below nipple area -  Pt has been exercising a lot - jumping on trampoline, jogging.   Pt also wanted to know if she has lymphedema.  Informed pt that PT can evaluate pt for lymphedema as well.   Denied drainage nor discharge from nipple, denied fever, denied warm to touch, denied redness of right breast.. Pt also reported since yesterday, pt noticed little  red spots - not raised, no drainage, no itching on Right thigh more than Left.  Denied pain nor redness of legs.  Pt wanted to know what Dr. Burr Medico would suggest. Pt's   Phone     [phone]   ;    Cell      [phone].

## 2017-07-31 NOTE — Telephone Encounter (Signed)
Spoke with Paula [name] and informed Paula [name] of referral and Dr. Ernestina Penna instructions below.  Paula [name] voiced understanding.

## 2017-07-31 NOTE — Telephone Encounter (Signed)
I will place PT referral order. I suggest her to watch her skin rash at her thighs, and call her PCP if it gets worse. I don't think it's related to her prior breast cancer.   Thanks   Truitt Merle MD

## 2017-08-01 ENCOUNTER — Ambulatory Visit: Payer: Managed Care, Other (non HMO) | Attending: Hematology | Admitting: Physical Therapy

## 2017-08-01 DIAGNOSIS — M25511 Pain in right shoulder: Secondary | ICD-10-CM | POA: Insufficient documentation

## 2017-08-01 DIAGNOSIS — M25611 Stiffness of right shoulder, not elsewhere classified: Secondary | ICD-10-CM | POA: Insufficient documentation

## 2017-08-01 NOTE — Therapy (Signed)
[place], [place], [address] Phone: [phone]   Fax:  [phone]  Physical Therapy Evaluation  Patient Details  Name: Paula [name] MRN: [id number] Date of Birth: 1956-10-06 Referring Provider: Dr. Truitt Merle  Encounter Date: 08/01/2017      PT End of Session - 08/01/17 1209    Visit Number 1   Number of Visits 3   Date for PT Re-Evaluation 09/12/17   PT Start Time 1100   PT Stop Time 1203   PT Time Calculation (min) 63 min   Activity Tolerance Patient tolerated treatment well   Behavior During Therapy [facility] for tasks assessed/performed      Past Medical History:  Diagnosis Date  . Candida infection    in bowels  . 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 [name] infection   . History of radiation therapy 04/09/17- 05/01/17   Right Breast 42.56 Gy in 16 fractions  . Hypercholesteremia   . Hypothyroid   . Osteoporosis   . Skin cancer    right side of face basal cell    Past Surgical History:  Procedure Laterality Date  . basal cell skin cancer     to right side of face two times  . BREAST SURGERY     right breast biopsy  . TONSILLECTOMY     as a child    There were no vitals filed for this visit.       Subjective Assessment - 08/01/17 1106    Subjective I saw you at [facility] and mentioned that when I do exercise it hurts on my right side. Trying to do exercise because I have osteoporosis.  It's mild, but I don't want any pain.   Pertinent History The patient had previously undergone right lumpectomy on 12/05/16 at [facility]. Pathology revealed invasive ductal carcinoma, multifocal, spanning 9 mm, 7 mm, and 3 mm, as well as DCIS grade 3 with microcalcification.  Completed XRT 05/01/17. Osteoporosis.            [facility] PT Assessment - 08/01/17 0001      Assessment   Medical Diagnosis right breast cancer   Referring Provider Dr. Truitt Merle   Onset Date/Surgical Date  12/05/16   Hand Dominance Right   Prior Therapy none     Precautions   Precautions Other (comment)   Precaution Comments cancer precautions     Restrictions   Weight Bearing Restrictions No     Balance Screen   Has the patient fallen in the past 6 months No   Has the patient had a decrease in activity level because of a fear of falling?  No   Is the patient reluctant to leave their home because of a fear of falling?  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 3x/day, runs 1-2x/week for 1-1.5 miles, likes to hike (once a week); is eating a lot of fruits and vegetables and does coffee enemas; is not eating processed foods and is trying to be vegan; does contemplative prayer daily;  tries to practice joy breath (heavy breathing)     Cognition   Overall Cognitive Status Within Functional Limits for tasks assessed     Observation/Other Assessments   Observations right breast appears firmer, possibly a little larger than left   Skin Integrity incision for lymph node and at breast (superior nipple) are both well-healed     Posture/Postural Control   Posture/Postural Control Postural limitations   Postural Limitations Forward head     ROM / Strength   AROM / PROM / Strength AROM;Strength     AROM   AROM Assessment Site Shoulder   Right/Left Shoulder Right;Left   Right Shoulder Flexion 142 Degrees  standing; some pulling in right axilla with passive flexion   Right Shoulder ABduction 180 Degrees   Right Shoulder Internal Rotation --  WFL supine   Right Shoulder External Rotation 90 Degrees    Right Shoulder Horizontal ABduction --  [facility] +   Left Shoulder Flexion 159 Degrees   Left Shoulder ABduction 180 Degrees   Left Shoulder Internal Rotation --  [facility] supine   Left Shoulder External Rotation 90 Degrees   Left Shoulder Horizontal ABduction --  WFL+     Strength   Overall Strength Comments both shoulders grossly 5/5           LYMPHEDEMA/ONCOLOGY QUESTIONNAIRE - 08/01/17 1133      Type   Cancer Type right breast     Surgeries   Lumpectomy Date 12/05/16   Sentinel Lymph Node Biopsy Date 12/05/16     Treatment   Past Chemotherapy Treatment Yes   Past Radiation Treatment Yes   Date 05/01/17     Lymphedema Assessments   Lymphedema Assessments Upper extremities     Right Upper Extremity Lymphedema   10 cm Proximal to Olecranon Process 21.3 cm   Olecranon Process 20.2 cm   10 cm Proximal to Ulnar Styloid Process 17 cm   Just Proximal to Ulnar Styloid Process 13.9 cm   Across Hand at [employer] 16.8 cm   At [place] of 2nd Digit 5.3 cm     Left Upper Extremity Lymphedema   10 cm Proximal to Olecranon Process 21.5 cm   Olecranon Process 20 cm   10 cm Proximal to Ulnar Styloid Process 16.6 cm   Just Proximal to Ulnar Styloid Process 13.9 cm   Across Hand at [employer] 16.3 cm   At [place] of 2nd Digit 5.1 cm         Objective measurements completed on examination: See above findings.                  PT Education - 08/01/17 1211    Education provided Yes   Education Details about compression bra features and the benefit of wearing a compression or sports bra, at least for exercise   Person(s) Educated Patient   Methods Explanation;Handout   Comprehension Verbalized understanding                [place] - 08/01/17 1215      CC Long Term Goal  #1   Title Patient will be independent in a home exercise program for right shoulder flexibility.   Time 2   Period Weeks   Status New     CC Long Term Goal  #2    Title Patient will be knowledgeable about appropriate sports or compression bras.   Time 2   Period Weeks   Status New  Plan - 08/01/17 1209    Clinical Impression Statement Pt. who is s/p breast cancer lumpectomy, chemotherapy and radiation reports ongoing right breast and axilla pain.  She has this often after exercise that involves bouncing, such as using a rebounder and jogging.  Her right breast tissue is somewhat firm.  Right shoulder AROM is good other than in flexion, where she has 17 degrees fewer ROM than on left, and some feeling of tightness at end range.  We discussed the benefits of wearing a compression or sports bra for activity today, even if she only wear it when active and not at other times.  Also discussed other treatment options including strength ABC program.  At this time, patient would like to just do one visit to learn stretching for right shoulder. Arm circumference measurements show no evidence of lymphedema in UEs.   Clinical Presentation Stable   Clinical Decision Making Low   Rehab Potential Good   PT Frequency 1x / week  one visit only, possibly two prn   PT Duration Other (comment)  1 more time   PT Treatment/Interventions ADLs/Self Care Home Management;Therapeutic exercise;Patient/family education;Manual techniques;Passive range of motion   PT Next Visit Plan Instruct in a variety of right shoulder stretches with focus on flexion, and give HEP.  If time, do passive ROM and myofascial release for this area.   Consulted and Agree with Plan of Care Patient      Patient will benefit from skilled therapeutic intervention in order to improve the following deficits and impairments:  Pain, Decreased range of motion  Visit Diagnosis: Stiffness of right shoulder, not elsewhere classified - Plan: PT plan of care cert/re-cert  Right shoulder pain, unspecified chronicity - Plan: PT plan of care cert/re-cert     Problem List Patient Active Problem  List   Diagnosis Date Noted  . Neutropenia with fever ([place]) 03/25/2017  . Febrile neutropenia ([place]) 03/25/2017  . Breast cancer of upper-outer quadrant of right female breast ([place]) 01/08/2017  . Menopause 04/03/2014  . Basal cell carcinoma of skin 03/30/2014  . Hyperlipidemia 03/30/2014  . Neck pain 03/30/2014  . Colon polyp 03/30/2014  . Family history of breast cancer in first degree relative  MGM, mother and sister 03/30/2014  . Fibrocystic breast disease 03/30/2014  . Osteoporosis  last Dexa  02/2012 03/30/2014    SALISBURY,DONNA 08/01/2017, 12:19 PM  [place], [place], [address] Phone: [phone]   Fax:  [phone]  Name: Paula [name] MRN: [id number] Date of Birth: 06-02-1956  Serafina Royals, PT 08/01/17 12:19 PM

## 2017-08-20 ENCOUNTER — Ambulatory Visit: Payer: Managed Care, Other (non HMO) | Admitting: Physical Therapy

## 2017-08-21 ENCOUNTER — Ambulatory Visit: Payer: Managed Care, Other (non HMO) | Admitting: Physical Therapy

## 2017-08-21 DIAGNOSIS — M25611 Stiffness of right shoulder, not elsewhere classified: Secondary | ICD-10-CM | POA: Diagnosis not present

## 2017-08-21 DIAGNOSIS — M25511 Pain in right shoulder: Secondary | ICD-10-CM

## 2017-08-21 NOTE — Patient Instructions (Addendum)
Flexors Stick Stretch I    Stand or sit, dowel in palm of arm to be stretched. Other arm, holding dowel at side and behind body, pushes arm being stretched forward and upward until straight over head. Hold __5_ seconds. Repeat _5__ times per session. Do __1-2_ sessions per day.  Copyright  VHI. All rights reserved.  Flexors Stretch, Prone    From kneeling position, slide arms forward while pushing buttocks toward floor. Hold _30__ seconds. Repeat _1-2__ times per session. Do _1-2__ sessions per day.  Copyright  VHI. All rights reserved.  Flexors Stretch, Standing    Stand near wall and slide arm up, with palm facing away from wall, by leaning toward wall--or do this with your hand on a door jamb. Hold __30_ seconds.  Repeat _1-2__ times per session. Do _1-2__ sessions per day.  Copyright  VHI. All rights reserved.  CHEST: Doorway, Bilateral - Standing    Standing in doorway, place hands on wall with elbows bent at shoulder height--or put arms up higher. Lean forward. Hold _30__ seconds. _1-2__ reps per set, __1-2_ sets per day, _7__ days per week  Copyright  VHI. All rights reserved.  Lower Trunk Rotation    Bring both knees in to chest. Rotate from side to side, keeping knees together and feet off floor--or cross one straight leg over the other to the side.  Hold for 30 seconds. Repeat __1-2__ times per set. Do __1__ sets per session. Do _1-2___ sessions per day.  http://orth.exer.us/151   Copyright  VHI. All rights reserved.

## 2017-08-21 NOTE — Therapy (Signed)
[place], [place], [address] Phone: [phone]   Fax:  [phone]  Physical Therapy Treatment  Patient Details  Name: Paula [name] MRN: [id number] Date of Birth: 1956-06-15 Referring Provider: Dr. Truitt Merle  Encounter Date: 08/21/2017      PT End of Session - 08/21/17 1739    Visit Number 2   Number of Visits 3   Date for PT Re-Evaluation 09/12/17   PT Start Time 1351   PT Stop Time 1435   PT Time Calculation (min) 44 min   Activity Tolerance Patient tolerated treatment well   Behavior During Therapy [facility] for tasks assessed/performed      Past Medical History:  Diagnosis Date  . Candida infection    in bowels  . 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 [name] infection   . History of radiation therapy 04/09/17- 05/01/17   Right Breast 42.56 Gy in 16 fractions  . Hypercholesteremia   . Hypothyroid   . Osteoporosis   . Skin cancer    right side of face basal cell    Past Surgical History:  Procedure Laterality Date  . basal cell skin cancer     to right side of face two times  . BREAST SURGERY     right breast biopsy  . TONSILLECTOMY     as a child    There were no vitals filed for this visit.      Subjective Assessment - 08/21/17 1354    Subjective "I think I know why my arm was hurting.  I was sleeping on a bed and I moved the mattress to the floor and it's been better.  Although I haven't been jogging as much either." Found a sports bra and has worn it some, but it's not comfortable, but it did help.   Pertinent History The patient had previously undergone right lumpectomy on 12/05/16 at [facility] Pathology revealed invasive ductal carcinoma, multifocal, spanning 9 mm, 7 mm, and 3 mm, as well as DCIS grade 3 with microcalcification.  Completed XRT 05/01/17. Osteoporosis.   Currently in Pain? No/denies            [facility] PT Assessment - 08/21/17 0001      AROM    Right Shoulder Flexion 160 Degrees  in standing                     [facility] Adult PT Treatment/Exercise - 08/21/17 0001      Self-Care   Self-Care Other Self-Care Comments   Other Self-Care Comments  Looked at patient's sports bra and discussed that; patient asked about exercise for osteoporosis, so this was discussed a bit     Exercises   Exercises Shoulder     Shoulder Exercises: Supine   Other Supine Exercises arms out to the side or then up in a "Y" position with lower trunk rotation to left     Shoulder Exercises: Prone   Other Prone Exercises child's pose stretch for bilat. shoulder flexion x 30 seconds     Shoulder Exercises: Standing   Flexion AAROM;Right;5 reps  with dowel   ABduction AROM;Right;5 reps  with left lateral trunk lean, 5 counts   Other Standing Exercises doorway stretch with both arms up in "Y" position with forward lunge; then right UE wall walking for flexion                PT Education - 08/21/17  93    Education provided Yes   Education Details stretches for shoulder flexion and abduction   Person(s) Educated Patient   Methods Explanation;Demonstration;Verbal cues;Handout   Comprehension Verbalized understanding;Returned demonstration                [facility] Goals - 08/21/17 1742      CC Long Term Goal  #1   Title Patient will be independent in a home exercise program for right shoulder flexibility.   Status Achieved     CC Long Term Goal  #2   Status Achieved     CC Long Term Goal  #3   Title Pt. will be knowledgeable about appropriate exercise for osteoporosis.   Time 2   Period Weeks   Status New            Plan - 08/21/17 1740    Clinical Impression Statement Pt. has already done well with changing her mattress set-up so that her right arm pain is decreased.  Measurement shows improved AROM of right shoulder already as well.  She was shown exercises today for shoulder flexibility. She has  osteoporosis and is concerned about that, so we plan to have another visit to educate her about that.   Rehab Potential Good   PT Frequency 1x / week   PT Duration 2 weeks   PT Treatment/Interventions ADLs/Self Care Home Management;Therapeutic exercise;Patient/family education;Manual techniques;Passive range of motion   PT Next Visit Plan Pt. is to bring in her rubber tubing equipment and a list of the size weights she has at home so that we can plan an exercise program for her that will be weightbearing and bone-building.   Consulted and Agree with Plan of Care Patient      Patient will benefit from skilled therapeutic intervention in order to improve the following deficits and impairments:  Pain, Decreased range of motion  Visit Diagnosis: Stiffness of right shoulder, not elsewhere classified  Right shoulder pain, unspecified chronicity     Problem List Patient Active Problem List   Diagnosis Date Noted  . Neutropenia with fever ([place]) 03/25/2017  . Febrile neutropenia ([place]) 03/25/2017  . Breast cancer of upper-outer quadrant of right female breast ([place]) 01/08/2017  . Menopause 04/03/2014  . Basal cell carcinoma of skin 03/30/2014  . Hyperlipidemia 03/30/2014  . Neck pain 03/30/2014  . Colon polyp 03/30/2014  . Family history of breast cancer in first degree relative  MGM, mother and sister 03/30/2014  . Fibrocystic breast disease 03/30/2014  . Osteoporosis  last Dexa  02/2012 03/30/2014    Ryelee Albee 08/21/2017, 5:43 PM  [place], [place], [address] Phone: [phone]   Fax:  [phone]  Name: Paula [name] MRN: [id number] Date of Birth: [birth year]  Serafina Royals, PT 08/21/17 5:44 PM

## 2017-08-27 ENCOUNTER — Ambulatory Visit: Payer: Managed Care, Other (non HMO) | Admitting: Physical Therapy

## 2017-08-27 DIAGNOSIS — M25611 Stiffness of right shoulder, not elsewhere classified: Secondary | ICD-10-CM | POA: Diagnosis not present

## 2017-08-27 DIAGNOSIS — M25511 Pain in right shoulder: Secondary | ICD-10-CM

## 2017-08-27 NOTE — Patient Instructions (Addendum)
DO WHAT MAKES YOU TALLER, NOT WHAT MAKES YOU SMALLER.  Think about keeping abdominal muscles engaged and you can contract your buttock muscles.  Avoid bending forward and avoid bending and twisting.  Consider getting a weighted vest to wear for walking.  Why exercise?  So many benefits! Here are SOME of them: 1. Heart health, including raising your good cholesterol level and reducing heart rate and blood pressure 2. Lung health, including improved lung capacity 3. It burns fats, and most of [place] can stand to be leaner, whether or not we are overweight. 4. It increases the body's natural painkillers and mood elevators, so makes you feel better. 5. Not only makes you feel better, but look better too 6. Improves sleep 7. Takes a bite out of stress 8. May decrease your risk of many types of cancer 9. If you are currently undergoing cancer treatment, exercise may improve your ability to tolerate treatments including chemotherapy. 10. For everybody, it can improve your energy level. Those with cancer-related fatigue report a 40-50% reduction in this symptom when exercising regularly. 11. If you are a survivor of breast, colon, or prostate cancer, it may decrease your risk of a recurrence. (This may hold for other cancers too, but so far we have data just for these three types.)  How to exercise: 1. Get your doctor's okay. 2. Pick something you enjoy doing, like walking, Zumba, biking, swimming, or whatever. 3. Start at low intensity and time, then gradually increase.  (See walking program handout.) 4. Set a goal to achieve over time.  The American Cancer Society, American Heart Association, and U.S. Dept. of Health and Human Services recommend 150 minutes of moderate exercise, 75 minutes of vigorous exercise, or a combination of both per week. This should be done in episodes at least 10 minutes long, spread throughout the week.  Need help being motivated? 1. Pick something you enjoy doing,  because you'll be more inclined to stick with that activity than something that feels like a chore. 2. Do it with a friend so that you are accountable to each other. 3. Schedule it into your day. Place it on your calendar and keep that appointment just like you do any appointment that you make. 4. Join an exercise group that meets at a specific time.  That way, you have to show up on time, and that makes it harder to procrastinate about doing your workout.  It also keeps you accountable-people begin to expect you to be there. 5. Join a gym where you feel comfortable and not intimidated, at the right cost. 6. Sign up for something that you'll need to be in shape for on a specific date, like a 1K or a 5K to walk or run, a 20 or 30 mile bike ride, a mud run or something like that. If the date is looming, you know you'll need to train to be ready for it.  An added benefit is that many of these are fundraisers for good causes. 7. If you've already paid for a gym membership, group exercise class or event, you might as well work out, so you haven't wasted your money!   Breast cancer recurrence rates were reduced in groups where exercise was done 3 or more hours a week at a moderate-vigorous pace.  In general, with your exercise, it is good to work to the point of fatigue.  Try doing 12-15 repetitions of exercises, and do 1-3 sets.  You should feel, when you get to the  last couple reps, like that's about as much as you want to or can do.  Some ideas:  1) With your weight vest on, or with ankle weights and/or hand weights, do forward lunge walking; do sidestepping with squats.  2) With a weight in each hand, stand and do overhead presses, lifting weights overhead and back to your shoulders.   You could combine this with a squat:  Squat first, then straighten up and raise the weight up overhead.  3) Stand with a dumbbell in each hand or one heavier one in both hands together.  Hold arms upright from the  shoulders, and straighten and bend the elbows, lifting the weights with straightening the elbows and keeping upper arms stationary.  4) Stand with a dumbbell in each hand and do bicep curls.  5) 3-way arm raises:  Hold a weight (2-3 lbs. to start) in each hand.  In standing, raise arms just to shoulder height in 3 motions: arms out to the sides, arms halfway forward, and arms straight forward.   Also:  Rubber tubing can be used as an alternative to weights for these.

## 2017-08-27 NOTE — Therapy (Signed)
[place], [place], [address] Phone: [phone]   Fax:  [phone]  Physical Therapy Treatment  Patient Details  Name: Paula [name] MRN: [id number] Date of Birth: 06-Apr-1956 Referring Provider: Dr. Truitt Merle  Encounter Date: 08/27/2017      PT End of Session - 08/27/17 2023    Visit Number 3   Number of Visits 3   PT Start Time 1350   PT Stop Time 1436   PT Time Calculation (min) 46 min   Activity Tolerance Patient tolerated treatment well   Behavior During Therapy [facility] for tasks assessed/performed      Past Medical History:  Diagnosis Date  . Candida infection    in bowels  . 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 [name] infection   . History of radiation therapy 04/09/17- 05/01/17   Right Breast 42.56 Gy in 16 fractions  . Hypercholesteremia   . Hypothyroid   . Osteoporosis   . Skin cancer    right side of face basal cell    Past Surgical History:  Procedure Laterality Date  . basal cell skin cancer     to right side of face two times  . BREAST SURGERY     right breast biopsy  . TONSILLECTOMY     as a child    There were no vitals filed for this visit.      Subjective Assessment - 08/27/17 1355    Subjective "I remember what I was going to tell you--to update you on some supplements." "My arm's feeling better.  I haven't been as dedicated as I had wanted to be with the exercises."   Pertinent History The patient had previously undergone right lumpectomy on 12/05/16 at [facility]. Pathology revealed invasive ductal carcinoma, multifocal, spanning 9 mm, 7 mm, and 3 mm, as well as DCIS grade 3 with microcalcification.  Completed XRT 05/01/17. Osteoporosis.   Currently in Pain? No/denies                         [facility] Adult PT Treatment/Exercise - 08/27/17 0001      Exercises   Exercises Other Exercises   Other Exercises  Educated about weightbearing  exercise and resistive exercise as per handout in instruction section; pt. performed some of each exercise.                  PT Education - 08/27/17 2021    Education provided Yes   Education Details about weightbearing and resistance exercises for osteoporosis; also about wearing a compression sleeve for exercise, how and where to obtain one; also about ABC class   Person(s) Educated Patient   Methods Explanation;Verbal cues;Handout  including lymphedema info from [facility] class handout   Comprehension Verbalized understanding;Returned demonstration                [facility] Goals - 08/27/17 2026      CC Long Term Goal  #1   Title Patient will be independent in a home exercise program for right shoulder flexibility.   Status Achieved     CC Long Term Goal  #2   Title Patient will be knowledgeable about appropriate sports or compression bras.   Status Achieved     CC Long Term Goal  #3   Title Pt. will be knowledgeable about appropriate exercise for osteoporosis.   Status Achieved  Plan - 08/27/17 2024    Clinical Impression Statement Pt. continues to report her pain is better.  She was attentive for instruction about home exercise program for strengthening and weightbearing exercise. She is ready for discharge.   Rehab Potential Good   PT Treatment/Interventions ADLs/Self Care Home Management;Therapeutic exercise;Patient/family education;Manual techniques;Passive range of motion   PT Next Visit Plan None; discharge today   Consulted and Agree with Plan of Care Patient      Patient will benefit from skilled therapeutic intervention in order to improve the following deficits and impairments:  Pain, Decreased range of motion  Visit Diagnosis: Stiffness of right shoulder, not elsewhere classified  Right shoulder pain, unspecified chronicity     Problem List Patient Active Problem List   Diagnosis Date Noted  . Neutropenia with fever  ([place]) 03/25/2017  . Febrile neutropenia ([place]) 03/25/2017  . Breast cancer of upper-outer quadrant of right female breast ([place]) 01/08/2017  . Menopause 04/03/2014  . Basal cell carcinoma of skin 03/30/2014  . Hyperlipidemia 03/30/2014  . Neck pain 03/30/2014  . Colon polyp 03/30/2014  . Family history of breast cancer in first degree relative  MGM, mother and sister 03/30/2014  . Fibrocystic breast disease 03/30/2014  . Osteoporosis  last Dexa  02/2012 03/30/2014    Christyan Reger 08/27/2017, 8:28 PM  [place], [place], [address] Phone: [phone]   Fax:  [phone]  Name: Paula [name] MRN: [id number] Date of Birth: 1956/10/16  PHYSICAL THERAPY DISCHARGE SUMMARY  Visits from Start of Care: 3  Current functional level related to goals / functional outcomes: Goals met as noted above.    Remaining deficits: Weakness noted, particularly in patient's arms when she attempted some resistive exercises.   Education / Equipment: Home exercise program for stretching, strengthening, and weightbearing.  Plan: Patient agrees to discharge.  Patient goals were met. Patient is being discharged due to meeting the stated rehab goals.  ?????    Serafina Royals, PT 08/27/17 8:30 PM

## 2017-09-02 ENCOUNTER — Telehealth: Payer: Self-pay

## 2017-09-02 NOTE — Telephone Encounter (Signed)
Nutrition  Message received to call patient and schedule for nutrition appointment.  Spoke with patient and nutrition appointment scheduled for 11/15 at 11:15am with Ernestene Kiel.  Jaye Saal B. Zenia Resides, [place], [place] Registered Dietitian [phone] (pager)

## 2017-09-11 ENCOUNTER — Ambulatory Visit: Payer: Managed Care, Other (non HMO) | Admitting: Nutrition

## 2017-09-11 ENCOUNTER — Encounter: Payer: Managed Care, Other (non HMO) | Admitting: Nutrition

## 2017-09-11 NOTE — Progress Notes (Signed)
Met with patient who has completed treatment for AAA-Negative breast cancer. Patient has questions about vegan diet. She is interested in recipes and how to incorporate more protein.  Nutrition diagnosis:  Food and nutrition related knowledge deficit related to Vegan diet as evidenced by no prior need for nutrition related information.  Intervention: Educated patient on strategies for incorporating Vegan proteins into her meals and snacks. Encouraged patient to look through cookbooks and find recipes that she would like to try. Allowed patient are borrow a cookbook for her review. Questions answered.  Teach back method used.  Monitoring, evaluation, goals:  Patient will work to increase Vegan sources of protein to ensure healthy diet.  No follow-up required; nutrition diagnosis resolved.  **Disclaimer: This note was dictated with voice recognition software. Similar sounding words can inadvertently be transcribed and this note may contain transcription errors which may not have been corrected upon publication of note.**

## 2017-09-12 ENCOUNTER — Other Ambulatory Visit: Payer: Self-pay | Admitting: Surgical Oncology

## 2017-09-12 DIAGNOSIS — Z139 Encounter for screening, unspecified: Secondary | ICD-10-CM

## 2017-10-31 ENCOUNTER — Ambulatory Visit: Payer: Managed Care, Other (non HMO)

## 2017-10-31 ENCOUNTER — Ambulatory Visit: Payer: Managed Care, Other (non HMO) | Admitting: Hematology

## 2017-10-31 ENCOUNTER — Other Ambulatory Visit: Payer: Managed Care, Other (non HMO)

## 2017-11-05 ENCOUNTER — Other Ambulatory Visit: Payer: Managed Care, Other (non HMO)

## 2017-11-05 ENCOUNTER — Ambulatory Visit: Payer: Managed Care, Other (non HMO) | Admitting: Hematology

## 2017-11-25 NOTE — Progress Notes (Signed)
[place]  [phone] [phone]  Clinic Follow Up Note   Patient Care Team: Hayden Rasmussen, MD as PCP - General (Family Medicine)   Oncology History   Cancer Staging Malignant neoplasm of upper outer quadrant of female breast [facility]) Staging form: Breast, AJCC 8th Edition - Clinical: No stage assigned - Unsigned - Pathologic: pT1b, pN0, cM0, ER: Negative, PR: Negative, HER2: Negative - Signed by Eppie Gibson, MD on 01/08/2017       Basal cell carcinoma of skin   03/30/2014 Initial Diagnosis    Basal cell carcinoma of skin       Breast cancer of upper-outer quadrant of right female breast ([place])   02/01/2016 Mammogram    Breast density category B. There are new punctate calcification at the 11:00 position of right breast, highly suspicious for malignancy. Patient refused biopsy      12/05/2016 Initial Diagnosis    Malignant neoplasm of upper outer quadrant of female breast ([place])      12/05/2016 Surgery    Right breast lumpectomy and sentinel lymph node biopsy by Dr. Ernst Bowler at [facility]       12/05/2016 Pathology Results    Right breast lumpectomy and a sentinel lymph node biopsy showed invasive ductal carcinoma, multifocal, tumor size 9 mm, 7 mm and 3 mm. Grade 3. High-grade DCIS with microcalcifications. Final margins were negative for invasive and DCIS. 2 sentinel lymph nodes were negative.      12/05/2016 Receptors her2    ER, PR and HER-2 triple negative      01/22/2017 Imaging    Bone Density  ASSESSMENT: The BMD measured at AP Spine L1-L4 is 0.766 g/cm2 with a T-score of -3.5. This patient is considered OSTEOPOROTIC according to [facility]) criteria. There has been a statistically significant decrease in BMD of left hip since exam dated 03/12/2012. Spine was not compared to prior study due to exclusion of vertebral bodies.      01/29/2017 - 03/12/2017 Adjuvant Chemotherapy    Docetaxel and Cytoxan every 3 weeks  starting 01/29/17. Neulasta given 2 days after chemo. She received 3 cycles. Last cycle was canceled by patient due to the concerns of long-term side effects.      03/25/2017 - 03/27/2017 Hospital Admission    Admit date: 03/25/17 Admission diagnosis: Neutropenic Fever  Additional comments: resolved with granix      04/09/2017 - 05/01/2017 Radiation Therapy          CHIEF COMPLAINTS:  Follow up right triple negative breast cancer   HISTORY OF PRESENTING ILLNESS:  Paula [name] 62 y.o. female is here because of recently diagnosed right multifocal ductal carcinoma, triple negative.  She is s/p right lumpectomy and node biopsy on 12/05/2016. She was referred by her radiation oncologist Dr. Isidore Moos. She presents to my clinic by herself today.  The patient had a routine mammogram on 01/2016. The findings demonstrated a group of indeterminate microcalcification over the upper outer right breast for which  A biopsy was recommended, which was declined at the time. The patient opted to pursue a thermogram instead that was reported as normal. A few months after, while walking her dog, and felt pain in her arm. A repeat thermogram was done and showed an abnormality on the opposite side. She then decided to repeat a mammogram.   Repeat mammogram on 10/25/16 showed an irregular 7 mm mass over the posterior third of the upper right breast with few associated microcalcifications, as this mass lies  along the posterior edge of the previously described segmental microclacifcations. Targeted ultrasound showed bilobed hypoechoic mass with mild border irregularly over the 10:30 position of the right breast 9 cm from the nipple corresponding to the mass with the border irregularity.  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  Right lumpectomy and sentinel node dissection on  12/05/16 with Dr. Ernst Bowler revealed multifocal breast cancer - pT1b, pN0.  The patient presents today to discuss the roll that chemotherapy may have to help with her disease. Patient does not live in [place], but in [place] She reports her sisters have experienced the same thing, which has resulted in [place], Stage 0. She is here to get another opinion, due to her cancer being triple negative. She denies any pain, swelling and has full ROM. Patient does yoga and walk to help. Also denies fever. Patient does not smoke. She does not want to take calcium because she thinks it may have cause colon polyps.   Patient reports history of parasites which was found 20 years ago and tests are still positive. She also has history of EBV virus infection.  Patient is strongly believe homeopathic and alternative medicine, she takes multiple vitamin and herbal supplements.  CURRENT THERAPY: Surveillance  INTERVAL HISTORY: Paula [name] returns for a follow up. She reports she is doing well overall. She states her strength is returning and she is back to her normal routine. She notes she is eating healthy and is taking a lot of vitamins and supplements. She is doing this herself, not guided by her PCP. She does not think any of the products contain estrogen/progesterone. She is very active. She reports she has parasites but it does not cause any nonbearbale abdominal symptoms. She has a mammogram scheduled for 12/12/17.   On review of systems, pt denies new pain, or any other complaints at this time. Pertinent positives are listed and detailed within the above HPI.   REVIEW OF SYSTEMS:   Constitutional: Denies fevers, chills or abnormal night sweats, (+) good energy Eyes: Denies blurriness of vision, double vision or watery eyes Ears, nose, mouth, throat, and face: Denies mucositis or sore throat Respiratory: Denies cough, dyspnea or wheezes Cardiovascular: Denies palpitation, chest discomfort or lower  extremity swelling Gastrointestinal:  Denies nausea, heartburn  Skin: no rash Lymphatics: Denies new lymphadenopathy or easy bruising Neurological:Denies numbness, tingling or new weaknesses Behavioral/Psych: Mood is stable, no new changes All other systems were reviewed with the patient and are negative.   MEDICAL HISTORY:  Past Medical History:  Diagnosis Date  . Candida infection    in bowels  . 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 [name] infection   . History of radiation therapy 04/09/17- 05/01/17   Right Breast 42.56 Gy in 16 fractions  . Hypercholesteremia   . Hypothyroid   . Osteoporosis   . Skin cancer    right side of face basal cell    SURGICAL HISTORY: Past Surgical History:  Procedure Laterality Date  . basal cell skin cancer     to right side of face two times  . BREAST SURGERY     right breast biopsy  . TONSILLECTOMY     as a child    SOCIAL HISTORY: Social History   Socioeconomic History  . Marital status: Married    Spouse name: Not on file  . Number  of children: Not on file  . Years of education: Not on file  . Highest education level: Not on file  Social Needs  . Financial resource strain: Not on file  . Food insecurity - worry: Not on file  . Food insecurity - inability: Not on file  . Transportation needs - medical: Not on file  . Transportation needs - non-medical: Not on file  Occupational History  . Not on file  Tobacco Use  . Smoking status: Former Smoker    Packs/day: 0.25    Years: 2.00    Pack years: 0.50    Last attempt to quit: 03/30/1976    Years since quitting: 41.6  . Smokeless tobacco: Never Used  . Tobacco comment: she smoked socially as a teenager  Substance and Sexual Activity  . Alcohol use: Yes    Comment: rare wine use  . Drug use: No  . Sexual activity: Yes    Birth control/protection: Post-menopausal  Other Topics Concern  . Not on file  Social History Narrative  . Not on file     FAMILY HISTORY: Family History  Problem Relation Age of Onset  . Cancer Mother 5       breast  . Cancer Father        pancreatic  . Cancer Sister 42       breast  . Heart disease Maternal Grandfather   . Cancer Paternal Grandmother 46       lymphoma  . Cancer Paternal Grandfather 69       stomach  . Cancer Maternal Grandmother 95       breast cancer   . Cancer Sister 49       breast cancer   2 children, not biological  ALLERGIES:  has No Known Allergies.  MEDICATIONS:  Current Outpatient Medications  Medication Sig Dispense Refill  . ARMOUR THYROID 30 MG tablet 2 TABLET EVERY DAY  3  . Ascorbic Acid (VITAMIN C) 100 MG tablet Take 600 mg by mouth 6 (six) times daily.     . cholecalciferol (VITAMIN D) 1000 UNITS tablet Take 5,000 Units by mouth daily.     Marland Kitchen co-enzyme Q-10 50 MG capsule Take 200 mg by mouth daily.     . Cyanocobalamin (VITAMIN B 12 PO) Take 500 mcg by mouth daily.    . Digestive Enzymes (BETAINE HCL) 650-130 MG CAPS Take 1 capsule by mouth 3 (three) times daily with meals.     . Digestive Enzymes (ENZYMATIC DIGESTANT PO) Take 1 tablet by mouth 3 (three) times daily.    Marland Kitchen GLUTAMINE PO Take by mouth. For leaky gut    . GLUTATHIONE PO Take 250 mg by mouth daily.    . IODINE, KELP, PO Take by mouth. ? Dose 42m?    .Marland KitchenMAGNESIUM CITRATE PO Take 200 mg by mouth 2 (two) times daily.    . Melatonin 3-2 MG TABS Take 20 mg by mouth every evening. Actual dose is 3-674m    . Methylsulfonylmethane (MSM) POWD Take by mouth.    . niacin 50 MG tablet Take 50 mg by mouth at bedtime.    . Pregnenolone POWD by Does not apply route daily.    . Marland KitchenUERCETIN PO Take 1,000 mg by mouth 3 (three) times daily. With bromelain 240 mg    . Selenium (V-R SELENIUM) 200 MCG TABS Take by mouth.    . TURMERIC PO Take 250 mg by mouth daily.    . Marland KitchenNABLE TO FIND  Med Name: mushrooms    . UNABLE TO FIND Take 600 mg by mouth daily. Med Name:  NAC/Nutricost    . UNABLE TO FIND Take 3 tablets by  mouth daily. Med Name: [facility] [address]    . VITAMIN K PO Take 150 mg by mouth daily.     Marland Kitchen ZINC ASPARTATE PO Take 15 mg by mouth daily. For leaky gut    . ondansetron (ZOFRAN) 8 MG tablet Take 1 tablet (8 mg total) by mouth 2 (two) times daily as needed for refractory nausea / vomiting. Start on day 3 after chemo. (Patient not taking: Reported on 04/01/2017) 30 tablet 1  . prochlorperazine (COMPAZINE) 10 MG tablet Take 1 tablet (10 mg total) by mouth every 6 (six) hours as needed (Nausea or vomiting). (Patient not taking: Reported on 04/01/2017) 30 tablet 1   No current facility-administered medications for this visit.     PHYSICAL EXAMINATION:  ECOG PERFORMANCE STATUS: 0 BP 106/61 (BP Location: Left Arm, Patient Position: Sitting)   Pulse 85   Temp 97.7 F (36.5 C) (Oral)   Resp 18   Ht '5\' 4"'  (1.626 m)   Wt 108 lb 14.4 oz (49.4 kg)   SpO2 100%   BMI 18.69 kg/m  GENERAL:alert, no distress and comfortable SKIN: skin color, texture, turgor are normal, no rashes or significant lesions EYES: normal, conjunctiva are pink and non-injected, sclera clear OROPHARYNX:no exudate, no erythema and lips, buccal mucosa, and tongue normal  NECK: supple, thyroid normal size, non-tender, without nodularity LYMPH:  no palpable lymphadenopathy in the cervical, axillary or inguinal LUNGS: clear to auscultation and percussion with normal breathing effort HEART: regular rate & rhythm and no murmurs and no lower extremity edema ABDOMEN:abdomen soft, non-tender and normal bowel sounds Musculoskeletal:no cyanosis of digits and no clubbing  PSYCH: alert & oriented x 3 with fluent speech NEURO: no focal motor/sensory deficits BREAST: The incision in right breast above areola well healed. No palpable mass or adenopathy. Nipple pink, no erythema.  LABORATORY DATA:  I have reviewed the data as listed CBC Latest Ref Rng & Units 11/26/2017 05/30/2017 03/31/2017  WBC 3.9 - 10.3 K/uL 5.3 6.1 11.2(H)  Hemoglobin  11.6 - 15.9 g/dL 12.8 12.1 10.8(L)  Hematocrit 34.8 - 46.6 % 38.7 38.1 33.8(L)  Platelets 145 - 400 K/uL 241 202 347   CMP Latest Ref Rng & Units 11/26/2017 05/30/2017 03/31/2017  Glucose 70 - 140 mg/dL 80 80 89  BUN 7 - 26 mg/dL 11 14.0 14.3  Creatinine 0.60 - 1.10 mg/dL 0.67 0.7 0.7  Sodium 136 - 145 mmol/L 136 135(L) 140  Potassium 3.5 - 5.1 mmol/L 4.8 4.1 4.3  Chloride 98 - 109 mmol/L 98 - -  CO2 22 - 29 mmol/L 30(H) 28 27  Calcium 8.4 - 10.4 mg/dL 9.9 9.8 10.0  Total Protein 6.4 - 8.3 g/dL 7.3 7.2 6.9  Total Bilirubin 0.2 - 1.2 mg/dL 0.2 0.24 0.23  Alkaline Phos 40 - 150 U/L 110 100 87  AST 5 - 34 U/L 26 24 38(H)  ALT 0 - 55 U/L 21 20 40   PATHOLOGY REPORT  See onc history   RADIOGRAPHIC STUDIES: I have personally reviewed the radiological images as listed and agreed with the findings in the report. No results found.   DEXA 01/22/18 ASSESSMENT: The BMD measured at AP Spine L1-L4 is 0.766 g/cm2 with a T-score of -3.5.  ASSESSMENT & PLAN: 62 y.o. Caucasian female, postmenopausal  1. 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 -She had early stage breast cancer, had a complete surgical resection. -she was reluctant to take adjuvant chemo, and finally has started TC, plan for 4 cycles -Patient takes multiple vitamins and herbs supplements, I advised the patient not to hold herbal supplements during her chemo -She tolerated chemotherapy well.  -She declined Neulasta after this cycle chemotherapy, she was admitted for neutropenic fever after chemo, ID workup was negative. Her neutropenic resolved with 1 dose of Granix. -She was previously very concerned about long-term side effects from chemotherapy, especially the small risk of leukemia and MDS. I tried to encourage her to take the  last cycle chemotherapy this week, and explained to her that the risk is small. I did state that the benefit of last cycle chemo is probably less than the first few cycles. After lengthy discussion, patient decided not to pursue the last cycle chemotherapy. -She has completed adjuvant radiation therapy on 05/01/2017  -She was previously interested in alternative medicine, such as vitamin C infusion. We discussed that there is no sufficient to send her for evidence to support vitamin C infusion in cancer patients, and that I would not offer her. She is currently on multiple supplements. -I encouraged her to continue healthy diet, and exercise regularly, which she is compliant  - Labs reviewed and within normal ranges. She is clinically doing well. Physical exam was unremarkable, I have no clinical concern for recurrence. -Patient takes multiple vitamins and herbs supplements, I advised the patient not to take any supplement with estrogen/progesterone due to her risk of developing another form of breast cancer. I had her medication list updated today. -Continue surveillance. -F/u in 6 months  2. Genetic Testing - Mother and two sisters with breast cancer, all tested negative. Sister with VUS in BRCA2. 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 - Father had pancreatic cancer .  3. Osteoporosis of spine  -Bone scan on 01/22/17 revealed worsening osteoporosis, with T score of -3.5, she is at high risk for fracture. -I advised the patient to continue calcium and Vitamin D supplementation. -I previously recommended her to consider biphosphonate, such as oral Fosamax or iv zometa, to improve her bone density. I have discussed again and she declined  - she will follow up with PCP -She will continue to take Vitamin D and calcium supplements    Plan:   -Continue surveillance.Lab and f/u in 6 months  -mammogram scheduled in  11/2017  No orders of the defined types were placed in this encounter.   All questions were answered. The patient knows to call the clinic with any problems, questions or concerns.  I spent 15 minutes counseling the patient face to face. The total time spent in the appointment was 20 minutes and more than 50% was on counseling.  This document serves as a record of services personally performed by Truitt Merle, MD. It was created on her behalf by Theresia Bough, a trained medical scribe. The creation of this record is based on the scribe's personal observations and the provider's statements to them.   I have reviewed the above documentation for accuracy and completeness, and I agree with the above.    Truitt Merle, MD 11/26/2017 4:37 PM

## 2017-11-26 ENCOUNTER — Encounter: Payer: Self-pay | Admitting: Hematology

## 2017-11-26 ENCOUNTER — Telehealth: Payer: Self-pay | Admitting: Hematology

## 2017-11-26 ENCOUNTER — Inpatient Hospital Stay: Payer: Managed Care, Other (non HMO) | Attending: Hematology

## 2017-11-26 ENCOUNTER — Inpatient Hospital Stay (HOSPITAL_BASED_OUTPATIENT_CLINIC_OR_DEPARTMENT_OTHER): Payer: Managed Care, Other (non HMO) | Admitting: Hematology

## 2017-11-26 VITALS — BP 106/61 | HR 85 | Temp 97.7°F | Resp 18 | Ht 64.0 in | Wt 108.9 lb

## 2017-11-26 DIAGNOSIS — Z923 Personal history of irradiation: Secondary | ICD-10-CM | POA: Insufficient documentation

## 2017-11-26 DIAGNOSIS — Z9221 Personal history of antineoplastic chemotherapy: Secondary | ICD-10-CM | POA: Diagnosis not present

## 2017-11-26 DIAGNOSIS — B3789 Other sites of candidiasis: Secondary | ICD-10-CM | POA: Insufficient documentation

## 2017-11-26 DIAGNOSIS — Z79899 Other long term (current) drug therapy: Secondary | ICD-10-CM

## 2017-11-26 DIAGNOSIS — Z800 Family history of malignant neoplasm of digestive organs: Secondary | ICD-10-CM | POA: Diagnosis not present

## 2017-11-26 DIAGNOSIS — Z85828 Personal history of other malignant neoplasm of skin: Secondary | ICD-10-CM | POA: Insufficient documentation

## 2017-11-26 DIAGNOSIS — E039 Hypothyroidism, unspecified: Secondary | ICD-10-CM | POA: Insufficient documentation

## 2017-11-26 DIAGNOSIS — Z87891 Personal history of nicotine dependence: Secondary | ICD-10-CM | POA: Insufficient documentation

## 2017-11-26 DIAGNOSIS — Z803 Family history of malignant neoplasm of breast: Secondary | ICD-10-CM

## 2017-11-26 DIAGNOSIS — B719 Cestode infection, unspecified: Secondary | ICD-10-CM

## 2017-11-26 DIAGNOSIS — M810 Age-related osteoporosis without current pathological fracture: Secondary | ICD-10-CM

## 2017-11-26 DIAGNOSIS — Z171 Estrogen receptor negative status [ER-]: Secondary | ICD-10-CM | POA: Insufficient documentation

## 2017-11-26 DIAGNOSIS — E7800 Pure hypercholesterolemia, unspecified: Secondary | ICD-10-CM | POA: Diagnosis not present

## 2017-11-26 DIAGNOSIS — C50411 Malignant neoplasm of upper-outer quadrant of right female breast: Secondary | ICD-10-CM | POA: Insufficient documentation

## 2017-11-26 DIAGNOSIS — A071 Giardiasis [lambliasis]: Secondary | ICD-10-CM | POA: Diagnosis not present

## 2017-11-26 DIAGNOSIS — Z806 Family history of leukemia: Secondary | ICD-10-CM | POA: Diagnosis not present

## 2017-11-26 LAB — COMPREHENSIVE METABOLIC PANEL
ALBUMIN: 4.5 g/dL (ref 3.5–5.0)
ALT: 21 U/L (ref 0–55)
ANION GAP: 8 (ref 3–11)
AST: 26 U/L (ref 5–34)
Alkaline Phosphatase: 110 U/L (ref 40–150)
BILIRUBIN TOTAL: 0.2 mg/dL (ref 0.2–1.2)
BUN: 11 mg/dL (ref 7–26)
CHLORIDE: 98 mmol/L (ref 98–109)
CO2: 30 mmol/L — ABNORMAL HIGH (ref 22–29)
Calcium: 9.9 mg/dL (ref 8.4–10.4)
Creatinine, Ser: 0.67 mg/dL (ref 0.60–1.10)
GFR calc Af Amer: >60 mL/min (ref >60)
GFR calc non Af Amer: >60 mL/min (ref >60)
GLUCOSE: 80 mg/dL (ref 70–140)
POTASSIUM: 4.8 mmol/L (ref 3.5–5.1)
SODIUM: 136 mmol/L (ref 136–145)
Total Protein: 7.3 g/dL (ref 6.4–8.3)

## 2017-11-26 LAB — CBC WITH DIFFERENTIAL/PLATELET
Basophils Absolute: 0 10*3/uL (ref 0.0–0.1)
Basophils Relative: 1 %
EOS PCT: 1 %
Eosinophils Absolute: 0.1 10*3/uL (ref 0.0–0.5)
HEMATOCRIT: 38.7 % (ref 34.8–46.6)
Hemoglobin: 12.8 g/dL (ref 11.6–15.9)
LYMPHS PCT: 31 %
Lymphs Abs: 1.6 10*3/uL (ref 0.9–3.3)
MCH: 30.7 pg (ref 25.1–34.0)
MCHC: 33.1 g/dL (ref 31.5–36.0)
MCV: 92.7 fL (ref 79.5–101.0)
MONO ABS: 0.5 10*3/uL (ref 0.1–0.9)
Monocytes Relative: 9 %
NEUTROS ABS: 3 10*3/uL (ref 1.5–6.5)
Neutrophils Relative %: 58 %
PLATELETS: 241 10*3/uL (ref 145–400)
RBC: 4.18 MIL/uL (ref 3.70–5.45)
RDW: 12.1 % (ref 11.2–14.5)
WBC: 5.3 10*3/uL (ref 3.9–10.3)

## 2017-11-26 NOTE — Telephone Encounter (Signed)
Left message on voicemail for patient regarding follow up appointment per 1/30 LOS

## 2017-12-04 ENCOUNTER — Telehealth: Payer: Self-pay | Admitting: *Deleted

## 2017-12-04 NOTE — Telephone Encounter (Signed)
-----   Message from Truitt Merle, MD sent at 11/29/2017  3:36 PM EST ----- Please let her know the CMP was normal, thanks  Truitt Merle  11/29/2017

## 2017-12-04 NOTE — Telephone Encounter (Signed)
Called pt and left message on voice mail of CMP results normal as per Dr. Ernestina Penna instructions.

## 2017-12-12 ENCOUNTER — Ambulatory Visit: Payer: Managed Care, Other (non HMO)

## 2018-05-12 ENCOUNTER — Telehealth: Payer: Self-pay | Admitting: Hematology

## 2018-05-12 NOTE — Telephone Encounter (Signed)
Patient left message to cancel July appointments. Per patient she is on vac after 7/12 and also currently has no insurance. Returned call and left message confirming cancellation of appointments and asked that patient call back to reschedule when she can come.

## 2018-05-25 ENCOUNTER — Ambulatory Visit: Payer: Managed Care, Other (non HMO) | Admitting: Hematology

## 2018-05-25 ENCOUNTER — Other Ambulatory Visit: Payer: Managed Care, Other (non HMO)

## 2018-09-29 ENCOUNTER — Other Ambulatory Visit: Payer: Self-pay

## 2018-09-29 DIAGNOSIS — Z853 Personal history of malignant neoplasm of breast: Secondary | ICD-10-CM

## 2018-10-05 LAB — HM MAMMOGRAPHY

## 2019-03-20 ENCOUNTER — Emergency Department (HOSPITAL_COMMUNITY): Payer: 59

## 2019-03-20 ENCOUNTER — Encounter (HOSPITAL_COMMUNITY): Payer: Self-pay

## 2019-03-20 ENCOUNTER — Other Ambulatory Visit: Payer: Self-pay

## 2019-03-20 ENCOUNTER — Inpatient Hospital Stay (HOSPITAL_COMMUNITY)
Admission: EM | Admit: 2019-03-20 | Discharge: 2019-03-24 | DRG: 982 | Disposition: A | Discharge status: Home / Self Care | Payer: 59 | Attending: Physician Assistant | Admitting: Physician Assistant

## 2019-03-20 DIAGNOSIS — S2243XA Multiple fractures of ribs, bilateral, initial encounter for closed fracture: Secondary | ICD-10-CM | POA: Diagnosis present

## 2019-03-20 DIAGNOSIS — M810 Age-related osteoporosis without current pathological fracture: Secondary | ICD-10-CM | POA: Diagnosis present

## 2019-03-20 DIAGNOSIS — Z803 Family history of malignant neoplasm of breast: Secondary | ICD-10-CM | POA: Diagnosis not present

## 2019-03-20 DIAGNOSIS — Z807 Family history of other malignant neoplasms of lymphoid, hematopoietic and related tissues: Secondary | ICD-10-CM

## 2019-03-20 DIAGNOSIS — S270XXA Traumatic pneumothorax, initial encounter: Secondary | ICD-10-CM | POA: Diagnosis present

## 2019-03-20 DIAGNOSIS — Z923 Personal history of irradiation: Secondary | ICD-10-CM

## 2019-03-20 DIAGNOSIS — R0602 Shortness of breath: Secondary | ICD-10-CM

## 2019-03-20 DIAGNOSIS — E7800 Pure hypercholesterolemia, unspecified: Secondary | ICD-10-CM | POA: Diagnosis present

## 2019-03-20 DIAGNOSIS — Z87891 Personal history of nicotine dependence: Secondary | ICD-10-CM

## 2019-03-20 DIAGNOSIS — Z4682 Encounter for fitting and adjustment of non-vascular catheter: Secondary | ICD-10-CM

## 2019-03-20 DIAGNOSIS — Z85828 Personal history of other malignant neoplasm of skin: Secondary | ICD-10-CM | POA: Diagnosis not present

## 2019-03-20 DIAGNOSIS — E039 Hypothyroidism, unspecified: Secondary | ICD-10-CM | POA: Diagnosis present

## 2019-03-20 DIAGNOSIS — Y9355 Activity, bike riding: Secondary | ICD-10-CM | POA: Diagnosis not present

## 2019-03-20 DIAGNOSIS — Z8673 Personal history of transient ischemic attack (TIA), and cerebral infarction without residual deficits: Secondary | ICD-10-CM

## 2019-03-20 DIAGNOSIS — S2220XA Unspecified fracture of sternum, initial encounter for closed fracture: Secondary | ICD-10-CM | POA: Diagnosis present

## 2019-03-20 DIAGNOSIS — Z1159 Encounter for screening for other viral diseases: Secondary | ICD-10-CM | POA: Diagnosis not present

## 2019-03-20 DIAGNOSIS — Z8249 Family history of ischemic heart disease and other diseases of the circulatory system: Secondary | ICD-10-CM | POA: Diagnosis not present

## 2019-03-20 DIAGNOSIS — J939 Pneumothorax, unspecified: Secondary | ICD-10-CM

## 2019-03-20 DIAGNOSIS — Z853 Personal history of malignant neoplasm of breast: Secondary | ICD-10-CM | POA: Diagnosis not present

## 2019-03-20 DIAGNOSIS — S42032A Displaced fracture of lateral end of left clavicle, initial encounter for closed fracture: Secondary | ICD-10-CM | POA: Diagnosis present

## 2019-03-20 LAB — I-STAT CHEM 8, ED
BUN: 8 mg/dL (ref 8–23)
Calcium, Ion: 0.97 mmol/L — ABNORMAL LOW (ref 1.15–1.40)
Chloride: 106 mmol/L (ref 98–111)
Creatinine, Ser: 0.4 mg/dL — ABNORMAL LOW (ref 0.44–1.00)
Glucose, Bld: 148 mg/dL — ABNORMAL HIGH (ref 70–99)
HCT: 32 % — ABNORMAL LOW (ref 36.0–46.0)
Hemoglobin: 10.9 g/dL — ABNORMAL LOW (ref 12.0–15.0)
Potassium: 3.6 mmol/L (ref 3.5–5.1)
Sodium: 138 mmol/L (ref 135–145)
TCO2: 21 mmol/L — ABNORMAL LOW (ref 22–32)

## 2019-03-20 LAB — PREPARE FRESH FROZEN PLASMA

## 2019-03-20 LAB — COMPREHENSIVE METABOLIC PANEL
ALT: 27 U/L (ref 0–44)
AST: 31 U/L (ref 15–41)
Albumin: 3.3 g/dL — ABNORMAL LOW (ref 3.5–5.0)
Alkaline Phosphatase: 101 U/L (ref 38–126)
Anion gap: 8 (ref 5–15)
BUN: 8 mg/dL (ref 8–23)
CO2: 22 mmol/L (ref 22–32)
Calcium: 7.7 mg/dL — ABNORMAL LOW (ref 8.9–10.3)
Chloride: 106 mmol/L (ref 98–111)
Creatinine, Ser: 0.49 mg/dL (ref 0.44–1.00)
GFR calc Af Amer: >60 mL/min (ref >60)
GFR calc non Af Amer: >60 mL/min (ref >60)
Glucose, Bld: 157 mg/dL — ABNORMAL HIGH (ref 70–99)
Potassium: 3.5 mmol/L (ref 3.5–5.1)
Sodium: 136 mmol/L (ref 135–145)
Total Bilirubin: 0.2 mg/dL — ABNORMAL LOW (ref 0.3–1.2)
Total Protein: 5.2 g/dL — ABNORMAL LOW (ref 6.5–8.1)

## 2019-03-20 LAB — CBC
HCT: 34 % — ABNORMAL LOW (ref 36.0–46.0)
Hemoglobin: 11.1 g/dL — ABNORMAL LOW (ref 12.0–15.0)
MCH: 31.9 pg (ref 26.0–34.0)
MCHC: 32.6 g/dL (ref 30.0–36.0)
MCV: 97.7 fL (ref 80.0–100.0)
Platelets: 242 10*3/uL (ref 150–400)
RBC: 3.48 MIL/uL — ABNORMAL LOW (ref 3.87–5.11)
RDW: 11.9 % (ref 11.5–15.5)
WBC: 15.6 10*3/uL — ABNORMAL HIGH (ref 4.0–10.5)
nRBC: 0 % (ref 0.0–0.2)

## 2019-03-20 LAB — BPAM FFP
Blood Product Expiration Date: 202005272359
Blood Product Expiration Date: 202005272359
ISSUE DATE / TIME: 202005231542
ISSUE DATE / TIME: 202005231542
Unit Type and Rh: 6200
Unit Type and Rh: 6200

## 2019-03-20 LAB — PROTIME-INR
INR: 1.1 (ref 0.8–1.2)
Prothrombin Time: 13.8 seconds (ref 11.4–15.2)

## 2019-03-20 LAB — ABO/RH: ABO/RH(D): B POS

## 2019-03-20 LAB — LACTIC ACID, PLASMA: Lactic Acid, Venous: 1.8 mmol/L (ref 0.5–1.9)

## 2019-03-20 LAB — SARS CORONAVIRUS 2 BY RT PCR (HOSPITAL ORDER, PERFORMED IN ~~LOC~~ HOSPITAL LAB): SARS Coronavirus 2: NEGATIVE

## 2019-03-20 LAB — ETHANOL: Alcohol, Ethyl (B): <10 mg/dL (ref <10)

## 2019-03-20 LAB — CDS SEROLOGY

## 2019-03-20 MED ORDER — MORPHINE SULFATE (PF) 2 MG/ML IV SOLN
2.0000 mg | INTRAVENOUS | Status: DC | PRN
  Administered 2019-03-20 – 2019-03-21 (×2): 2 mg via INTRAVENOUS
  Filled 2019-03-20 (×2): qty 1

## 2019-03-20 MED ORDER — IBUPROFEN 200 MG PO TABS
800.0000 mg | ORAL_TABLET | Freq: Three times a day (TID) | ORAL | Status: DC | PRN
  Administered 2019-03-21 – 2019-03-22 (×2): 800 mg via ORAL
  Administered 2019-03-23: 10:00:00 400 mg via ORAL
  Filled 2019-03-20 (×3): qty 4

## 2019-03-20 MED ORDER — DOCUSATE SODIUM 100 MG PO CAPS
100.0000 mg | ORAL_CAPSULE | Freq: Two times a day (BID) | ORAL | Status: DC
  Administered 2019-03-20 – 2019-03-21 (×3): 100 mg via ORAL
  Filled 2019-03-20 (×3): qty 1

## 2019-03-20 MED ORDER — FENTANYL CITRATE (PF) 100 MCG/2ML IJ SOLN
INTRAMUSCULAR | Status: AC
  Filled 2019-03-20: qty 2

## 2019-03-20 MED ORDER — FENTANYL CITRATE (PF) 100 MCG/2ML IJ SOLN
INTRAMUSCULAR | Status: AC | PRN
  Administered 2019-03-20: 50 ug via INTRAVENOUS

## 2019-03-20 MED ORDER — METOPROLOL TARTRATE 5 MG/5ML IV SOLN: 5.0000 mg | Freq: Four times a day (QID) | INTRAVENOUS | Status: DC | PRN

## 2019-03-20 MED ORDER — SODIUM CHLORIDE 0.9 % IV SOLN: INTRAVENOUS | Status: DC

## 2019-03-20 MED ORDER — SODIUM CHLORIDE 0.9 % IV SOLN
INTRAVENOUS | Status: AC | PRN
  Administered 2019-03-20: 1000 mL via INTRAVENOUS

## 2019-03-20 MED ORDER — ACETAMINOPHEN 325 MG PO TABS
650.0000 mg | ORAL_TABLET | ORAL | Status: DC | PRN
  Filled 2019-03-20: qty 2

## 2019-03-20 MED ORDER — SODIUM CHLORIDE 0.9 % IV SOLN
INTRAVENOUS | Status: AC | PRN
  Administered 2019-03-20: 500 mL via INTRAVENOUS

## 2019-03-20 MED ORDER — ONDANSETRON 4 MG PO TBDP: 4.0000 mg | ORAL_TABLET | Freq: Four times a day (QID) | ORAL | Status: DC | PRN

## 2019-03-20 MED ORDER — ONDANSETRON HCL 4 MG/2ML IJ SOLN: 4.0000 mg | Freq: Four times a day (QID) | INTRAMUSCULAR | Status: DC | PRN

## 2019-03-20 MED ORDER — OXYCODONE HCL 5 MG PO TABS
5.0000 mg | ORAL_TABLET | ORAL | Status: DC | PRN
  Filled 2019-03-20: qty 1

## 2019-03-20 MED ORDER — MORPHINE SULFATE (PF) 4 MG/ML IV SOLN: 4.0000 mg | Freq: Once | INTRAVENOUS | Status: DC

## 2019-03-20 MED ORDER — IOHEXOL 300 MG/ML  SOLN
100.0000 mL | Freq: Once | INTRAMUSCULAR | Status: AC | PRN
  Administered 2019-03-20: 100 mL via INTRAVENOUS

## 2019-03-20 NOTE — ED Notes (Signed)
Attempted report 

## 2019-03-20 NOTE — ED Notes (Signed)
Pt transported to CT with RN
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
## 2019-03-20 NOTE — ED Notes (Signed)
Pt becoming pale and having difficulty concentrating.  BP 63/48, verified manually.
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
## 2019-03-21 ENCOUNTER — Inpatient Hospital Stay (HOSPITAL_COMMUNITY): Payer: 59

## 2019-03-21 ENCOUNTER — Encounter (HOSPITAL_COMMUNITY): Payer: Self-pay

## 2019-03-21 LAB — TYPE AND SCREEN
ABO/RH(D): B POS
Antibody Screen: NEGATIVE
Unit division: 0
Unit division: 0

## 2019-03-21 LAB — BPAM RBC
Blood Product Expiration Date: 202006232359
Blood Product Expiration Date: 202006232359
ISSUE DATE / TIME: 202005231540
ISSUE DATE / TIME: 202005231540
Unit Type and Rh: 5100
Unit Type and Rh: 5100

## 2019-03-21 LAB — BASIC METABOLIC PANEL
Anion gap: 5 (ref 5–15)
BUN: 5 mg/dL — ABNORMAL LOW (ref 8–23)
CO2: 25 mmol/L (ref 22–32)
Calcium: 8.5 mg/dL — ABNORMAL LOW (ref 8.9–10.3)
Chloride: 97 mmol/L — ABNORMAL LOW (ref 98–111)
Creatinine, Ser: 0.42 mg/dL — ABNORMAL LOW (ref 0.44–1.00)
GFR calc Af Amer: >60 mL/min (ref >60)
GFR calc non Af Amer: >60 mL/min (ref >60)
Glucose, Bld: 121 mg/dL — ABNORMAL HIGH (ref 70–99)
Potassium: 3.8 mmol/L (ref 3.5–5.1)
Sodium: 127 mmol/L — ABNORMAL LOW (ref 135–145)

## 2019-03-21 LAB — CBC
HCT: 30.4 % — ABNORMAL LOW (ref 36.0–46.0)
Hemoglobin: 10.1 g/dL — ABNORMAL LOW (ref 12.0–15.0)
MCH: 31.6 pg (ref 26.0–34.0)
MCHC: 33.2 g/dL (ref 30.0–36.0)
MCV: 95 fL (ref 80.0–100.0)
Platelets: 204 10*3/uL (ref 150–400)
RBC: 3.2 MIL/uL — ABNORMAL LOW (ref 3.87–5.11)
RDW: 11.9 % (ref 11.5–15.5)
WBC: 8.2 10*3/uL (ref 4.0–10.5)
nRBC: 0 % (ref 0.0–0.2)

## 2019-03-21 LAB — HIV ANTIBODY (ROUTINE TESTING W REFLEX): HIV Screen 4th Generation wRfx: NONREACTIVE

## 2019-03-21 LAB — BLOOD PRODUCT ORDER (VERBAL) VERIFICATION

## 2019-03-21 MED ORDER — METHOCARBAMOL 750 MG PO TABS
750.0000 mg | ORAL_TABLET | Freq: Three times a day (TID) | ORAL | Status: DC | PRN
  Administered 2019-03-21 – 2019-03-24 (×3): 750 mg via ORAL
  Filled 2019-03-21 (×4): qty 1

## 2019-03-21 NOTE — Progress Notes (Signed)
OT Cancellation Note  Patient Details Name: Paula [name] MRN: [id number] DOB: [birth year]   Cancelled Treatment:    Reason Eval/Treat Not Completed: Other (comment). Pt reports she would really like to eat before we see her--feels it will give her some energy. Will return later today for evaluation.  [place], OTR/L Acute Rehab Services Pager [phone] Office [phone]     Almon Register 03/21/2019, 9:39 AM

## 2019-03-21 NOTE — Progress Notes (Signed)
Patient ID: Paula [name], female   DOB: 1956-01-15, 63 y.o.   MRN: [id number]    Subjective: B rib pain, does not want to take oxycodone or Ultram. No SOB.  Objective: Vital signs in last 24 hours: Temp:  [97.7 F (36.5 C)-99.2 F (37.3 C)] 98.8 F (37.1 C) (05/24 0819) Pulse Rate:  [75-101] 87 (05/24 0819) Resp:  [16-36] 20 (05/24 0819) BP: (67-121)/(48-98) 99/66 (05/24 0819) SpO2:  [94 %-100 %] 100 % (05/23 1822) Weight:  [49.9 kg] 49.9 kg (05/23 2000) Last BM Date: 03/20/19  Intake/Output from previous day: 05/23 0701 - 05/24 0700 In: 2250 [P.O.:1250; I.V.:1000] Out: 1330 [Urine:1300; Chest Tube:30] Intake/Output this shift: No intake/output data recorded.  General appearance: alert and cooperative Resp: clear to auscultation bilaterally Chest wall: right sided chest wall tenderness, left sided chest wall tenderness Cardio: regular rate and rhythm GI: soft, NT, ND Extremities: L buttock abrasion Neurologic: Mental status: Alert, oriented, thought content appropriate  Lab Results: CBC  Recent Labs    03/20/19 1541 03/20/19 1608 03/21/19 0230  WBC 15.6*  --  8.2  HGB 11.1* 10.9* 10.1*  HCT 34.0* 32.0* 30.4*  PLT 242  --  204   BMET Recent Labs    03/20/19 1541 03/20/19 1608 03/21/19 0230  NA 136 138 127*  K 3.5 3.6 3.8  CL 106 106 97*  CO2 22  --  25  GLUCOSE 157* 148* 121*  BUN 8 8 5*  CREATININE 0.49 0.40* 0.42*  CALCIUM 7.7*  --  8.5*     Assessment/Plan: 

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
## 2019-03-21 NOTE — Anesthesia Preprocedure Evaluation (Addendum)
Anesthesia Evaluation  Patient identified by MRN, date of birth, ID band Patient awake    Reviewed: Allergy & Precautions, NPO status , Patient's Chart, lab work & pertinent test results  Airway Mallampati: II  TM Distance: >3 FB Neck ROM: Full    Dental no notable dental hx. (+) Dental Advisory Given   Pulmonary pneumonia,  Bilateral rib fractures and pneumothoraces, Sternal Fx,    Pulmonary exam normal        Cardiovascular negative cardio ROS Normal cardiovascular exam     Neuro/Psych negative neurological ROS     GI/Hepatic negative GI ROS, Neg liver ROS,   Endo/Other  Hypothyroidism   Renal/GU negative Renal ROS     Musculoskeletal   Abdominal   Peds  Hematology negative hematology ROS (+)   Anesthesia Other Findings Day of surgery medications reviewed with the patient.  Reproductive/Obstetrics                           Anesthesia Physical Anesthesia Plan  ASA: III  Anesthesia Plan: General   Post-op Pain Management:    Induction: Intravenous  PONV Risk Score and Plan: 3 and Ondansetron, Dexamethasone and Scopolamine patch - Pre-op  Airway Management Planned: Oral ETT  Additional Equipment:   Intra-op Plan:   Post-operative Plan: Extubation in OR  Informed Consent: I have reviewed the patients History and Physical, chart, labs and discussed the procedure including the risks, benefits and alternatives for the proposed anesthesia with the patient or authorized representative who has indicated his/her understanding and acceptance.     Dental advisory given  Plan Discussed with: Anesthesiologist and CRNA  Anesthesia Plan Comments:       Anesthesia Quick Evaluation
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
## 2019-03-22 ENCOUNTER — Encounter (HOSPITAL_COMMUNITY): Admission: EM | Disposition: A | Discharge status: Home / Self Care | Payer: Self-pay | Source: Home / Self Care

## 2019-03-22 ENCOUNTER — Inpatient Hospital Stay (HOSPITAL_COMMUNITY): Payer: 59 | Admitting: Anesthesiology

## 2019-03-22 ENCOUNTER — Inpatient Hospital Stay (HOSPITAL_COMMUNITY): Payer: 59

## 2019-03-22 ENCOUNTER — Encounter (HOSPITAL_COMMUNITY): Payer: Self-pay | Admitting: Certified Registered"

## 2019-03-22 HISTORY — PX: ORIF CLAVICULAR FRACTURE: SHX5055

## 2019-03-22 LAB — SURGICAL PCR SCREEN
MRSA, PCR: NEGATIVE
Staphylococcus aureus: NEGATIVE

## 2019-03-22 SURGERY — OPEN REDUCTION INTERNAL FIXATION (ORIF) CLAVICULAR FRACTURE
Anesthesia: General | Laterality: Left

## 2019-03-22 MED ORDER — ACETAMINOPHEN 500 MG PO TABS
1000.0000 mg | ORAL_TABLET | Freq: Once | ORAL | Status: AC
  Administered 2019-03-22: 09:00:00 1000 mg via ORAL

## 2019-03-22 MED ORDER — ONDANSETRON HCL 4 MG/2ML IJ SOLN
INTRAMUSCULAR | Status: AC
  Filled 2019-03-22: qty 2

## 2019-03-22 MED ORDER — FENTANYL CITRATE (PF) 100 MCG/2ML IJ SOLN: 25.0000 ug | INTRAMUSCULAR | Status: DC | PRN

## 2019-03-22 MED ORDER — PROPOFOL 10 MG/ML IV BOLUS
INTRAVENOUS | Status: DC | PRN
  Administered 2019-03-22: 150 mg via INTRAVENOUS

## 2019-03-22 MED ORDER — DEXAMETHASONE SODIUM PHOSPHATE 10 MG/ML IJ SOLN
INTRAMUSCULAR | Status: AC
  Filled 2019-03-22: qty 2

## 2019-03-22 MED ORDER — 0.9 % SODIUM CHLORIDE (POUR BTL) OPTIME
TOPICAL | Status: DC | PRN
  Administered 2019-03-22: 1000 mL

## 2019-03-22 MED ORDER — CEFAZOLIN SODIUM-DEXTROSE 2-4 GM/100ML-% IV SOLN
2.0000 g | INTRAVENOUS | Status: AC
  Administered 2019-03-22: 09:00:00 2 g via INTRAVENOUS
  Filled 2019-03-22: qty 100

## 2019-03-22 MED ORDER — ALBUMIN HUMAN 5 % IV SOLN
INTRAVENOUS | Status: DC | PRN
  Administered 2019-03-22: 09:00:00 via INTRAVENOUS

## 2019-03-22 MED ORDER — ACETAMINOPHEN 500 MG PO TABS
ORAL_TABLET | ORAL | Status: AC
  Administered 2019-03-22: 09:00:00 1000 mg via ORAL
  Filled 2019-03-22: qty 2

## 2019-03-22 MED ORDER — MIDAZOLAM HCL 5 MG/5ML IJ SOLN
INTRAMUSCULAR | Status: DC | PRN
  Administered 2019-03-22: 2 mg via INTRAVENOUS

## 2019-03-22 MED ORDER — ACETAMINOPHEN 325 MG PO TABS: 325.0000 mg | ORAL_TABLET | Freq: Four times a day (QID) | ORAL | Status: DC | PRN

## 2019-03-22 MED ORDER — LACTATED RINGERS IV SOLN
INTRAVENOUS | Status: DC
  Administered 2019-03-22 (×2): via INTRAVENOUS

## 2019-03-22 MED ORDER — SODIUM CHLORIDE 0.9 % IV SOLN
INTRAVENOUS | Status: DC | PRN
  Administered 2019-03-22: 09:00:00 40 ug/min via INTRAVENOUS

## 2019-03-22 MED ORDER — PHENYLEPHRINE 40 MCG/ML (10ML) SYRINGE FOR IV PUSH (FOR BLOOD PRESSURE SUPPORT)
PREFILLED_SYRINGE | INTRAVENOUS | Status: DC | PRN
  Administered 2019-03-22 (×2): 120 ug via INTRAVENOUS
  Administered 2019-03-22 (×2): 80 ug via INTRAVENOUS

## 2019-03-22 MED ORDER — OXYCODONE HCL 5 MG PO TABS
5.0000 mg | ORAL_TABLET | ORAL | Status: DC | PRN
  Administered 2019-03-24: 09:00:00 5 mg via ORAL
  Filled 2019-03-22: qty 1

## 2019-03-22 MED ORDER — FENTANYL CITRATE (PF) 250 MCG/5ML IJ SOLN
INTRAMUSCULAR | Status: DC | PRN
  Administered 2019-03-22: 50 ug via INTRAVENOUS
  Administered 2019-03-22: 100 ug via INTRAVENOUS

## 2019-03-22 MED ORDER — GABAPENTIN 300 MG PO CAPS
300.0000 mg | ORAL_CAPSULE | Freq: Once | ORAL | Status: AC
  Administered 2019-03-22: 09:00:00 300 mg via ORAL

## 2019-03-22 MED ORDER — SUCCINYLCHOLINE CHLORIDE 200 MG/10ML IV SOSY
PREFILLED_SYRINGE | INTRAVENOUS | Status: AC
  Filled 2019-03-22: qty 10

## 2019-03-22 MED ORDER — BUPIVACAINE-EPINEPHRINE 0.25% -1:200000 IJ SOLN
INTRAMUSCULAR | Status: DC | PRN
  Administered 2019-03-22: 10 mL

## 2019-03-22 MED ORDER — ONDANSETRON HCL 4 MG/2ML IJ SOLN
INTRAMUSCULAR | Status: DC | PRN
  Administered 2019-03-22: 4 mg via INTRAVENOUS

## 2019-03-22 MED ORDER — GABAPENTIN 300 MG PO CAPS
300.0000 mg | ORAL_CAPSULE | Freq: Two times a day (BID) | ORAL | Status: AC
  Administered 2019-03-24: 300 mg via ORAL
  Filled 2019-03-22 (×3): qty 1

## 2019-03-22 MED ORDER — CEFAZOLIN SODIUM-DEXTROSE 1-4 GM/50ML-% IV SOLN
1.0000 g | Freq: Four times a day (QID) | INTRAVENOUS | Status: AC
  Administered 2019-03-22: 1 g via INTRAVENOUS
  Filled 2019-03-22 (×3): qty 50

## 2019-03-22 MED ORDER — MIDAZOLAM HCL 2 MG/2ML IJ SOLN
INTRAMUSCULAR | Status: AC
  Filled 2019-03-22: qty 2

## 2019-03-22 MED ORDER — GABAPENTIN 300 MG PO CAPS
ORAL_CAPSULE | ORAL | Status: AC
  Administered 2019-03-22: 09:00:00 300 mg via ORAL
  Filled 2019-03-22: qty 1

## 2019-03-22 MED ORDER — CHLORHEXIDINE GLUCONATE 4 % EX LIQD: 60.0000 mL | Freq: Once | CUTANEOUS | Status: DC

## 2019-03-22 MED ORDER — PHENYLEPHRINE 40 MCG/ML (10ML) SYRINGE FOR IV PUSH (FOR BLOOD PRESSURE SUPPORT)
PREFILLED_SYRINGE | INTRAVENOUS | Status: AC
  Filled 2019-03-22: qty 10

## 2019-03-22 MED ORDER — DOCUSATE SODIUM 100 MG PO CAPS
100.0000 mg | ORAL_CAPSULE | Freq: Two times a day (BID) | ORAL | Status: DC
  Administered 2019-03-22 – 2019-03-24 (×4): 100 mg via ORAL
  Filled 2019-03-22 (×5): qty 1

## 2019-03-22 MED ORDER — CEFAZOLIN SODIUM-DEXTROSE 2-4 GM/100ML-% IV SOLN
INTRAVENOUS | Status: AC
  Filled 2019-03-22: qty 100

## 2019-03-22 MED ORDER — LIDOCAINE 2% (20 MG/ML) 5 ML SYRINGE
INTRAMUSCULAR | Status: DC | PRN
  Administered 2019-03-22: 60 mg via INTRAVENOUS

## 2019-03-22 MED ORDER — SUCCINYLCHOLINE CHLORIDE 200 MG/10ML IV SOSY
PREFILLED_SYRINGE | INTRAVENOUS | Status: DC | PRN
  Administered 2019-03-22: 120 mg via INTRAVENOUS

## 2019-03-22 MED ORDER — CELECOXIB 200 MG PO CAPS
ORAL_CAPSULE | ORAL | Status: AC
  Administered 2019-03-22: 09:00:00 400 mg via ORAL
  Filled 2019-03-22: qty 2

## 2019-03-22 MED ORDER — CELECOXIB 200 MG PO CAPS
400.0000 mg | ORAL_CAPSULE | Freq: Once | ORAL | Status: AC
  Administered 2019-03-22: 09:00:00 400 mg via ORAL

## 2019-03-22 MED ORDER — PROPOFOL 10 MG/ML IV BOLUS
INTRAVENOUS | Status: AC
  Filled 2019-03-22: qty 20

## 2019-03-22 MED ORDER — SCOPOLAMINE 1 MG/3DAYS TD PT72
1.0000 | MEDICATED_PATCH | TRANSDERMAL | Status: DC
  Administered 2019-03-22: 09:00:00 1.5 mg via TRANSDERMAL

## 2019-03-22 MED ORDER — ONDANSETRON HCL 4 MG PO TABS: 4.0000 mg | ORAL_TABLET | Freq: Four times a day (QID) | ORAL | Status: DC | PRN

## 2019-03-22 MED ORDER — BUPIVACAINE-EPINEPHRINE (PF) 0.25% -1:200000 IJ SOLN
INTRAMUSCULAR | Status: AC
  Filled 2019-03-22: qty 30

## 2019-03-22 MED ORDER — FENTANYL CITRATE (PF) 250 MCG/5ML IJ SOLN
INTRAMUSCULAR | Status: AC
  Filled 2019-03-22: qty 5

## 2019-03-22 MED ORDER — SCOPOLAMINE 1 MG/3DAYS TD PT72
MEDICATED_PATCH | TRANSDERMAL | Status: AC
  Administered 2019-03-22: 09:00:00 1.5 mg via TRANSDERMAL
  Filled 2019-03-22: qty 1

## 2019-03-22 MED ORDER — PROMETHAZINE HCL 25 MG/ML IJ SOLN: 6.2500 mg | INTRAMUSCULAR | Status: DC | PRN

## 2019-03-22 MED ORDER — POLYETHYLENE GLYCOL 3350 17 G PO PACK: 17.0000 g | PACK | Freq: Every day | ORAL | Status: DC | PRN

## 2019-03-22 MED ORDER — ACETAMINOPHEN 500 MG PO TABS
1000.0000 mg | ORAL_TABLET | Freq: Three times a day (TID) | ORAL | Status: AC
  Filled 2019-03-22: qty 2

## 2019-03-22 MED ORDER — ONDANSETRON HCL 4 MG/2ML IJ SOLN: 4.0000 mg | Freq: Four times a day (QID) | INTRAMUSCULAR | Status: DC | PRN

## 2019-03-22 MED ORDER — DEXAMETHASONE SODIUM PHOSPHATE 10 MG/ML IJ SOLN
INTRAMUSCULAR | Status: DC | PRN
  Administered 2019-03-22: 5 mg via INTRAVENOUS

## 2019-03-22 SURGICAL SUPPLY — 50 items
CLEANER TIP ELECTROSURG 2X2 (MISCELLANEOUS) ×2 IMPLANT
CLSR STERI-STRIP ANTIMIC 1/2X4 (GAUZE/BANDAGES/DRESSINGS) ×1 IMPLANT
COVER WAND RF STERILE (DRAPES) ×2 IMPLANT
DRAPE C-ARM 42X72 X-RAY (DRAPES) ×2 IMPLANT
DRAPE C-ARM MINI 42X72 WSTRAPS (DRAPES) ×1 IMPLANT
DRAPE IMP U-DRAPE 54X76 (DRAPES) ×2 IMPLANT
DRAPE ORTHO SPLIT 77X108 STRL (DRAPES) ×2
DRAPE SURG ORHT 6 SPLT 77X108 (DRAPES) ×2 IMPLANT
DRAPE U-SHAPE 47X51 STRL (DRAPES) ×4 IMPLANT
DRILL 2.6X220MM LONG AO (BIT) ×1 IMPLANT
DRSG EMULSION OIL 3X3 NADH (GAUZE/BANDAGES/DRESSINGS) ×2 IMPLANT
DRSG MEPILEX BORDER 4X4 (GAUZE/BANDAGES/DRESSINGS) ×1 IMPLANT
DRSG MEPILEX BORDER 4X8 (GAUZE/BANDAGES/DRESSINGS) ×2 IMPLANT
DURAPREP 26ML APPLICATOR (WOUND CARE) ×2 IMPLANT
ELECT REM PT RETURN 9FT ADLT (ELECTROSURGICAL) ×2
ELECTRODE REM PT RTRN 9FT ADLT (ELECTROSURGICAL) ×1 IMPLANT
GAUZE SPONGE 4X4 12PLY STRL (GAUZE/BANDAGES/DRESSINGS) ×2 IMPLANT
GLOVE BIO SURGEON STRL SZ7.5 (GLOVE) ×4 IMPLANT
GLOVE BIOGEL PI IND STRL 8 (GLOVE) ×2 IMPLANT
GLOVE BIOGEL PI INDICATOR 8 (GLOVE) ×2
GOWN STRL REUS W/ TWL LRG LVL3 (GOWN DISPOSABLE) ×3 IMPLANT
GOWN STRL REUS W/TWL LRG LVL3 (GOWN DISPOSABLE) ×3
KIT AC JOINT DISP (KITS) ×1 IMPLANT
KIT BASIN OR (CUSTOM PROCEDURE TRAY) ×2 IMPLANT
KIT TURNOVER KIT B (KITS) ×2 IMPLANT
MANIFOLD NEPTUNE II (INSTRUMENTS) ×2 IMPLANT
NDL HYPO 25GX1X1/2 BEV (NEEDLE) ×1 IMPLANT
NEEDLE HYPO 25GX1X1/2 BEV (NEEDLE) ×2 IMPLANT
NS IRRIG 1000ML POUR BTL (IV SOLUTION) ×2 IMPLANT
PACK SHOULDER (CUSTOM PROCEDURE TRAY) ×2 IMPLANT
PACK UNIVERSAL I (CUSTOM PROCEDURE TRAY) ×2 IMPLANT
PAD ARMBOARD 7.5X6 YLW CONV (MISCELLANEOUS) ×4 IMPLANT
PLATE LATERAL SUPERIOR 3HOLE (Plate) ×1 IMPLANT
SCREW BONE 3.5X12 (Screw) ×3 IMPLANT
SCREW BONE 3.5X14MM (Screw) ×3 IMPLANT
SCREW LOCKING 12X3.5MM (Screw) ×1 IMPLANT
SCREW LOCKING 3.5X10MM (Screw) ×1 IMPLANT
SLING ARM FOAM STRAP LRG (SOFTGOODS) ×2 IMPLANT
SPONGE LAP 4X18 RFD (DISPOSABLE) ×2 IMPLANT
STRIP CLOSURE SKIN 1/2X4 (GAUZE/BANDAGES/DRESSINGS) ×4 IMPLANT
SUCTION FRAZIER HANDLE 10FR (MISCELLANEOUS) ×1
SUCTION TUBE FRAZIER 10FR DISP (MISCELLANEOUS) ×1 IMPLANT
SUT MNCRL AB 4-0 PS2 18 (SUTURE) ×3 IMPLANT
SUT MON AB 2-0 CT1 36 (SUTURE) ×2 IMPLANT
SYR CONTROL 10ML LL (SYRINGE) ×2 IMPLANT
TOWEL OR 17X24 6PK STRL BLUE (TOWEL DISPOSABLE) ×2 IMPLANT
TOWEL OR 17X26 10 PK STRL BLUE (TOWEL DISPOSABLE) ×2 IMPLANT
TOWEL OR NON WOVEN STRL DISP B (DISPOSABLE) ×2 IMPLANT
WATER STERILE IRR 1000ML POUR (IV SOLUTION) ×2 IMPLANT
ZIPLOOP AC JOINT REPAIR (Orthopedic Implant) ×1 IMPLANT

## 2019-03-22 NOTE — Anesthesia Postprocedure Evaluation (Signed)
Anesthesia Post Note  Patient: Paula [name]  Procedure(s) Performed: OPEN REDUCTION INTERNAL FIXATION (ORIF) CLAVICULAR FRACTURE (Left )     Patient location during evaluation: PACU Anesthesia Type: General Level of consciousness: sedated Pain management: pain level controlled Vital Signs Assessment: post-procedure vital signs reviewed and stable Respiratory status: spontaneous breathing and respiratory function stable Cardiovascular status: stable Postop Assessment: no apparent nausea or vomiting Anesthetic complications: no    Last Vitals:  Vitals:   03/22/19 1110 03/22/19 1130  BP:  115/72  Pulse: 80 80  Resp: 16 17  Temp: 36.8 C 36.7 C  SpO2: 96% 99%    Last Pain:  Vitals:   03/22/19 1130  TempSrc: Oral  PainSc:                  [place]

## 2019-03-22 NOTE — Progress Notes (Signed)
Patient request to bundle care. VS will be obtained when other care is required by patient. Will continue to monitor. Katherina Right RN

## 2019-03-22 NOTE — Anesthesia Procedure Notes (Signed)
Procedure Name: Intubation Date/Time: 03/22/2019 9:02 AM Performed by: Imagene Riches, CRNA Pre-anesthesia Checklist: Patient identified, Emergency Drugs available, Suction available and Patient being monitored Patient Re-evaluated:Patient Re-evaluated prior to induction Oxygen Delivery Method: Circle System Utilized Preoxygenation: Pre-oxygenation with 100% oxygen Induction Type: IV induction Laryngoscope Size: Miller and 2 Grade View: Grade I Tube type: Oral Tube size: 7.0 mm Number of attempts: 1 Airway Equipment and Method: Stylet and Oral airway Placement Confirmation: ETT inserted through vocal cords under direct vision,  positive ETCO2 and breath sounds checked- equal and bilateral Secured at: 22 cm Tube secured with: Tape Dental Injury: Teeth and Oropharynx as per pre-operative assessment

## 2019-03-22 NOTE — Transfer of Care (Signed)
Immediate Anesthesia Transfer of Care Note  Patient: Paula [name]  Procedure(s) Performed: OPEN REDUCTION INTERNAL FIXATION (ORIF) CLAVICULAR FRACTURE (Left )  Patient Location: PACU  Anesthesia Type:General  Level of Consciousness: awake, alert  and oriented  Airway & Oxygen Therapy: Patient Spontanous Breathing and Patient connected to nasal cannula oxygen  Post-op Assessment: Report given to RN and Post -op Vital signs reviewed and stable  Post vital signs: Reviewed and stable  Last Vitals:  Vitals Value Taken Time  BP 111/72 03/22/2019 10:51 AM  Temp    Pulse 88 03/22/2019 10:53 AM  Resp 15 03/22/2019 10:53 AM  SpO2 98 % 03/22/2019 10:53 AM  Vitals shown include unvalidated device data.  Last Pain:  Vitals:   03/22/19 1045  TempSrc:   PainSc: (P) 0-No pain      Patients Stated Pain Goal: 2 (87/68/11 5726)  Complications: No apparent anesthesia complications

## 2019-03-22 NOTE — Interval H&P Note (Signed)
I participated in the care of this patient and agree with the above history, physical and evaluation. I performed a review of the history and a physical exam as detailed   Latika Kronick Daniel Nerissa Constantin MD  

## 2019-03-22 NOTE — Progress Notes (Signed)
Report received from [place], [place] from [facility].

## 2019-03-22 NOTE — Op Note (Signed)
03/20/2019 - 03/22/2019  10:20 AM  PATIENT:  Paula [name]    PRE-OPERATIVE DIAGNOSIS:  fractured clavicle  POST-OPERATIVE DIAGNOSIS:  Same  PROCEDURE:  OPEN REDUCTION INTERNAL FIXATION (ORIF) CLAVICULAR FRACTURE  SURGEON:  Renette Butters, MD  PHYSICIAN ASSISTANT:  Roxan Hockey, PA-C, he was present and scrubbed throughout the case, critical for completion in a timely fashion, and for retraction, instrumentation, and closure.   ANESTHESIA:   General  PREOPERATIVE INDICATIONS:  Paula [name] is a  63 y.o. female with a diagnosis of fractured clavicle who elected for surgical management based on preoperative shortening and angulation and displacement of the fracture.    The risks benefits and alternatives were discussed with the patient preoperatively including but not limited to the risks of infection, bleeding, nerve injury, malunion, nonunion, hardware failure, the need for hardware removal, recurrent fracture, cardiopulmonary complications, the need for revision surgery, among others, and the patient was willing to proceed.    OPERATIVE IMPLANTS: stryker lateral clavicle plate and zip loop  OPERATIVE FINDINGS: Shortened, displaced clavicle fracture  OPERATIVE PROCEDURE: The patient was brought to the operating room and placed in the supine position. General anesthesia was administered. IV antibiotics were given. She was placed in the beach chair position. The upper extremity was prepped and draped in the usual sterile fashion. Time out was performed. Incision was made over the clavicle fracture. Dissection was carried down through the platysma, and the fracture site exposed. The fracture was extremely short.  I ultimately did however achieve satisfactory mobilization, and was able to reduce the fracture anatomically.   The proximal clavicle had avulsed all CC ligaments so I elected to back up this reduction with a zip loop cc ligament reconstruction.   I used fluoro guidance  to place the zip loop button through the center portion of the coracoid and I placed the loops through the clavical hole then around the plate.   I then placed a lateral plate with a combination of locking and non locking distal screws and 3 bicortical shaft sxcrews. I tentioned the zip loop and was happy with the reduction and stability.   I had excellent bony apposition and restoration of anatomic alignment of the clavicle. Used C-arm to confirm appropriate alignment, reduction of the fracture, and positioning of the plate and length of the screws.  I then took final C-arm pictures, irrigated the wounds copiously, and repaired the fascia with inverted figure-of-eight Vicryl suture. The subcutaneous tissue was closed with Vicryl as well, and the skin closed with steri-strips, and the patient was awakened and returned to the PACU in stable and satisfactory condition. There were no complications.   POSTOPERATIVE PLAN: Sling full time, DVT px: ambulation and mobilization and chemical px

## 2019-03-23 ENCOUNTER — Inpatient Hospital Stay (HOSPITAL_COMMUNITY): Payer: 59

## 2019-03-23 ENCOUNTER — Encounter (HOSPITAL_COMMUNITY): Payer: Self-pay | Admitting: Orthopedic Surgery
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
## 2019-03-23 NOTE — Discharge Instructions (Addendum)
Clavicle Fracture: Apply ice to reduce pain and swelling. Diet: As you were doing prior to hospitalization  Dressing:  Keep dressing on and dry until follow up. Activity:  Increase activity slowly as tolerated, but follow the weight bearing instructions below.  The rules on driving is that you can not be taking narcotics while you drive, and you must feel in control of the vehicle.   Weight Bearing: Non weight bearing affected arm.  Maintain sling at all times.   To prevent constipation: you may use a stool softener such as - Colace (over the counter) 100 mg by mouth twice a day  Drink plenty of fluids (prune juice may be helpful) and high fiber foods Miralax (over the counter) for constipation as needed.   Itching:  If you experience itching with your medications, try taking only a single pain pill, or even half a pain pill at a time.  You can also use benadryl over the counter for itching or also to help with sleep.  Precautions:  If you experience chest pain or shortness of breath - call 911 immediately for transfer to the hospital emergency department!! If you develop a fever greater that 101 F, purulent drainage from wound, increased redness or drainage from wound, or calf pain -- Call the office at [phone]                                        Follow- Up Appointment:  Please call for an appointment to be seen in 1-2 weeks [place] - [phone]    PNEUMOTHORAX OR HEMOTHORAX +/- [place]   1. PAIN CONTROL:  1. Pain is best controlled by a usual combination of three different methods TOGETHER:  i. Ice/Heat ii. Over the counter pain medication iii. Prescription pain medication 2. You may experience some swelling and bruising in area of broken ribs. Ice packs or heating pads (30-60 minutes up to 6 times a day) will help. Use ice for the first few days to help decrease swelling and bruising, then switch to heat to help relax tight/sore spots and  speed recovery. Some people prefer to use ice alone, heat alone, alternating between ice & heat. Experiment to what works for you. Swelling and bruising can take several weeks to resolve.  3. It is helpful to take an over-the-counter pain medication regularly for the first few weeks. Choose one of the following that works best for you:  i. Naproxen (Aleve, etc) Two 220mg  tabs twice a day ii. Ibuprofen (Advil, etc) Three 200mg  tabs four times a day (every meal & bedtime) iii. Acetaminophen (Tylenol, etc) 500-650mg  four times a day (every meal & bedtime) 4. A prescription for pain medication (such as oxycodone, hydrocodone, etc) may be given to you upon discharge. Take your pain medication as prescribed.  i. If you are having problems/concerns with the prescription medicine (does not control pain, nausea, vomiting, rash, itching, etc), please call us [phone] to see if we need to switch you to a different pain medicine that will work better for you and/or control your side effect better. ii. If you need a refill on your pain medication, please contact your pharmacy. They will contact our office to request authorization. Prescriptions will not be filled after 5 pm or on week-ends. 1. Avoid getting constipated. When taking pain medications, it is common to experience some constipation.  Increasing fluid intake and taking a fiber supplement (such as Metamucil, Citrucel, FiberCon, MiraLax, etc) 1-2 times a day regularly will usually help prevent this problem from occurring. A mild laxative (prune juice, Milk of Magnesia, MiraLax, etc) should be taken according to package directions if there are no bowel movements after 48 hours.  2. Watch out for diarrhea. If you have many loose bowel movements, simplify your diet to bland foods & liquids for a few days. Stop any stool softeners and decrease your fiber supplement. Switching to mild anti-diarrheal medications (Kayopectate, Pepto Bismol) can help. If this  worsens or does not improve, please call us. 3. Chest tube site wound: you may remove the dressing from your chest tube site 3 days after the removal of your chest tube. DO NOT shower over the dressing. Once   removed, you may shower as normal. Do not submerge your wound in water for 2-3 weeks.  4. FOLLOW UP  a. Please call our office to set up or confirm an appointment for follow up for 2 weeks after discharge. You will need to get a chest xray at either [facility] or [facility]. This will be outlined in your follow up instructions. Please call [facility] at [phone] if you have any questions about follow up.  b. If you have any orthopedic or other injuries you will need to follow up as outlined in your follow up instructions.   WHEN TO CALL us [phone]:  1. Poor pain control 2. Reactions / problems with new medications (rash/itching, nausea, etc)  3. Fever over 101.5 F (38.5 C) 4. Worsening swelling or bruising 5. Redness, drainage, pain or swelling around chest tube site 6. Worsening pain, productive cough, difficulty breathing or any other concerning symptoms  The clinic staff is available to answer your questions during regular business hours (8:30am-5pm). Please dont hesitate to call and ask to speak to one of our nurses for clinical concerns.  If you have a medical emergency, go to the nearest emergency room or call 911.  A surgeon from [facility] is always on call at the [facility], [place], [place], [place], [place] [address] ?  MAIN: [phone] ? TOLL FREE: [phone] ?  FAX [phone]  [email or web address]      Information on Rib Fractures  A rib fracture is a break or crack in one of the bones of the ribs. The ribs are long, curved bones that wrap around your chest and attach to your spine and your breastbone. The ribs protect your heart, lungs, and other organs in the chest. A  broken or cracked rib is often painful but is not usually serious. Most rib fractures heal on their own over time. However, rib fractures can be more serious if multiple ribs are broken or if broken ribs move out of place and push against other structures or organs. What are the causes? This condition is caused by:  Repetitive movements with high force, such as pitching a baseball or having severe coughing spells.  A direct blow to the chest, such as a sports injury, a car accident, or a fall.  Cancer that has spread to the bones, which can weaken bones and cause them to break. What are the signs or symptoms? Symptoms of this condition include:  Pain when you breathe in or cough.  Pain when someone presses on the injured area.  Feeling short of breath. How is this diagnosed?  This condition is diagnosed with a physical exam and medical history. Imaging tests may also be done, such as:  Chest X-ray.  CT scan.  MRI.  Bone scan.  Chest ultrasound. How is this treated? Treatment for this condition depends on the severity of the fracture. Most rib fractures usually heal on their own in 1-3 months. Sometimes healing takes longer if there is a cough that does not stop or if there are other activities that make the injury worse (aggravating factors). While you heal, you will be given medicines to control the pain. You will also be taught deep breathing exercises. Severe injuries may require hospitalization or surgery. Follow these instructions at home: Managing pain, stiffness, and swelling  If directed, apply ice to the injured area. ? Put ice in a plastic bag. ? Place a towel between your skin and the bag. ? Leave the ice on for 20 minutes, 2-3 times a day.  Take over-the-counter and prescription medicines only as told by your health care provider. Activity  Avoid a lot of activity and any activities or movements that cause pain. Be careful during activities and avoid bumping the  injured rib.  Slowly increase your activity as told by your health care provider. General instructions  Do deep breathing exercises as told by your health care provider. This helps prevent pneumonia, which is a common complication of a broken rib. Your health care provider may instruct you to: ? Take deep breaths several times a day. ? Try to cough several times a day, holding a pillow against the injured area. ? Use a device called incentive spirometer to practice deep breathing several times a day.  Drink enough fluid to keep your urine pale yellow.  Do not wear a rib belt or binder. These restrict breathing, which can lead to pneumonia.  Keep all follow-up visits as told by your health care provider. This is important. Contact a health care provider if:  You have a fever. Get help right away if:  You have difficulty breathing or you are short of breath.  You develop a cough that does not stop, or you cough up thick or bloody sputum.  You have nausea, vomiting, or pain in your abdomen.  Your pain gets worse and medicine does not help. Summary  A rib fracture is a break or crack in one of the bones of the ribs.  A broken or cracked rib is often painful but is not usually serious.  Most rib fractures heal on their own over time.  Treatment for this condition depends on the severity of the fracture.  Avoid a lot of activity and any activities or movements that cause pain. This information is not intended to replace advice given to you by your health care provider. Make sure you discuss any questions you have with your health care provider. Document Released: 10/14/2005 Document Revised: 01/13/2017 Document Reviewed: 01/13/2017 Elsevier Interactive Patient Education  2019 [employer]    Pneumothorax A pneumothorax is commonly called a collapsed lung. It is a condition in which air leaks from a lung and builds up between the thin layer of tissue that covers the lungs  (visceral pleura) and the interior wall of the chest cavity (parietal pleura). The air gets trapped outside the lung, between the lung and the chest wall (pleural space). The air takes up space and prevents the lung from fully expanding. This condition sometimes occurs suddenly with no apparent cause. The buildup of air may be small or large.  A small pneumothorax may go away on its own. A large pneumothorax will require treatment and hospitalization. What are the causes? This condition may be caused by:  Trauma and injury to the chest wall.  Surgery and other medical procedures.  A complication of an underlying lung problem, especially chronic obstructive pulmonary disease (COPD) or emphysema. Sometimes the cause of this condition is not known. What increases the risk? You are more likely to develop this condition if:  You have an underlying lung problem.  You smoke.  You are 24-62 years old, female, tall, and underweight.  You have a personal or family history of pneumothorax.  You have an eating disorder (anorexia nervosa). This condition can also happen quickly, even in people with no history of lung problems. What are the signs or symptoms? Sometimes a pneumothorax will have no symptoms. When symptoms are present, they can include:  Chest pain.  Shortness of breath.  Increased rate of breathing.  Bluish color to your lips or skin (cyanosis). How is this diagnosed? This condition may be diagnosed by:  A medical history and physical exam.  A chest X-ray, chest CT scan, or ultrasound. How is this treated? Treatment depends on how severe your condition is. The goal of treatment is to remove the extra air and allow your lung to expand back to its normal size.  For a small pneumothorax: ? No treatment may be needed. ? Extra oxygen is sometimes used to make it go away more quickly.  For a large pneumothorax or a pneumothorax that is causing symptoms, a procedure is done to  drain the air from your lungs. To do this, a health care provider may use: ? A needle with a syringe. This is used to suck air from a pleural space where no additional leakage is taking place. ? A chest tube. This is used to suck air where there is ongoing leakage into the pleural space. The chest tube may need to remain in place for several days until the air leak has healed.  In more severe cases, surgery may be needed to repair the damage that is causing the leak.  If you have multiple pneumothorax episodes or have an air leak that will not heal, a procedure called a pleurodesis may be done. A medicine is placed in the pleural space to irritate the tissues around the lung so that the lung will stick to the chest wall, seal any leaks, and stop any buildup of air in that space. If you have an underlying lung problem, severe symptoms, or a large pneumothorax you will usually need to stay in the hospital. Follow these instructions at home: Lifestyle  Do not use any products that contain nicotine or tobacco, such as cigarettes and e-cigarettes. These are major risk factors in pneumothorax. If you need help quitting, ask your health care provider.  Do not lift anything that is heavier than 10 lb (4.5 kg), or the limit that your health care provider tells you, until he or she says that it is safe.  Avoid activities that take a lot of effort (strenuous) for as long as told by your health care provider.  Return to your normal activities as told by your health care provider. Ask your health care provider what activities are safe for you.  Do not fly in an airplane or scuba dive until your health care provider says it is okay. General instructions  Take over-the-counter and prescription medicines only as told by your health care provider.  If a cough or pain makes it difficult for you to sleep at night, try sleeping in a semi-upright position in a recliner or by using 2 or 3 pillows.  If you had a  chest tube and it was removed, ask your health care provider when you can remove the bandage (dressing). While the dressing is in place, do not allow it to get wet.  Keep all follow-up visits as told by your health care provider. This is important. Contact a health care provider if:  You cough up thick mucus (sputum) that is yellow or green in color.  You were treated with a chest tube, and you have redness, increasing pain, or discharge at the site where it was placed. Get help right away if:  You have increasing chest pain or shortness of breath.  You have a cough that will not go away.  You begin coughing up blood.  You have pain that is getting worse or is not controlled with medicines.  The site where your chest tube was located opens up.  You feel air coming out of the site where the chest tube was placed.  You have a fever or persistent symptoms for more than 2-3 days.  You have a fever and your symptoms suddenly get worse. These symptoms may represent a serious problem that is an emergency. Do not wait to see if the symptoms will go away. Get medical help right away. Call your local emergency services (911 in the [place] Do not drive yourself to the hospital. Summary  A pneumothorax, commonly called a collapsed lung, is a condition in which air leaks from a lung and gets trapped between the lung and the chest wall (pleural space).  The buildup of air may be small or large. A small pneumothorax may go away on its own. A large pneumothorax will require treatment and hospitalization.  Treatment for this condition depends on how severe the pneumothorax is. The goal of treatment is to remove the extra air and allow the lung to expand back to its normal size. This information is not intended to replace advice given to you by your health care provider. Make sure you discuss any questions you have with your health care provider. Document Released: 10/14/2005 Document Revised:  09/22/2017 Document Reviewed: 09/22/2017 Elsevier Interactive Patient Education  2019 [employer].
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
## 2019-03-24 ENCOUNTER — Inpatient Hospital Stay (HOSPITAL_COMMUNITY): Payer: 59

## 2019-03-24 MED ORDER — METHOCARBAMOL 750 MG PO TABS: 750.0000 mg | ORAL_TABLET | Freq: Three times a day (TID) | ORAL | 0 refills | Status: DC | PRN

## 2019-03-24 MED ORDER — ACETAMINOPHEN 325 MG PO TABS: 325.0000 mg | ORAL_TABLET | Freq: Four times a day (QID) | ORAL | Status: DC | PRN

## 2019-03-24 MED ORDER — TRAMADOL HCL 50 MG PO TABS: 50.0000 mg | ORAL_TABLET | Freq: Four times a day (QID) | ORAL | 0 refills | Status: DC | PRN

## 2019-03-24 MED ORDER — VITAMIN K 100 MCG PO TABS: 100.0000 ug | ORAL_TABLET | Freq: Every day | ORAL | Status: AC

## 2019-03-24 MED ORDER — IBUPROFEN 800 MG PO TABS: 800.0000 mg | ORAL_TABLET | Freq: Three times a day (TID) | ORAL | 0 refills | Status: AC | PRN
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
## 2019-03-24 NOTE — Plan of Care (Signed)
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
## 2019-03-24 NOTE — Progress Notes (Signed)
Patient ID: Paula [name], female   DOB: 1956/10/15, 63 y.o.   MRN: [id number] 2 Days Post-Op  Subjective: Up in chair, no SOB, hoping to go home  Objective: Vital signs in last 24 hours: Temp:  [98.2 F (36.8 C)-99.7 F (37.6 C)] 99.7 F (37.6 C) (05/26 2129) Pulse Rate:  [87-97] 97 (05/26 2017) Resp:  [18-20] 18 (05/26 2129) BP: (114-144)/(72-97) 139/73 (05/26 2129) SpO2:  [98 %-100 %] 100 % (05/26 2017) Last BM Date: 03/20/19  Intake/Output from previous day: 05/26 0701 - 05/27 0700 In: 600 [P.O.:600] Out: 25 [Urine:5; Chest Tube:20] Intake/Output this shift: No intake/output data recorded.  General appearance: alert and cooperative Resp: clear to auscultation bilaterally Cardio: regular rate and rhythm GI: soft, NT Neurologic: Mental status: Alert, oriented, thought content appropriate  Lab Results: CBC  No results for input(s): WBC, HGB, HCT, PLT in the last 72 hours. BMET No results for input(s): NA, K, CL, CO2, GLUCOSE, BUN, CREATININE, CALCIUM in the last 72 hours. PT/INR No results for input(s): LABPROT, INR in the last 72 hours. ABG No results for input(s): PHART, HCO3 in the last 72 hours.  Invalid input(s): PCO2, PO2  Studies/Results: Dg Chest Port 1 View  Result Date: 03/23/2019 CLINICAL DATA:  Bilateral pneumothorax. EXAM: PORTABLE CHEST 1 VIEW COMPARISON:  Radiograph of Mar 22, 2019. FINDINGS: The heart size and mediastinal contours are within normal limits. Bilateral chest tubes are again noted and stable in position minimal left apical pneumothorax may be present. No pneumothorax is noted on the right. Bibasilar subsegmental atelectasis is noted with minimal bilateral pleural effusions. Subcutaneous emphysema is seen bilaterally which is stable. Stable left rib fractures are noted. IMPRESSION: Stable position of bilateral chest tubes are noted. Possible minimal left apical pneumothorax is noted. Stable bibasilar atelectasis is noted with minimal pleural  effusions. Electronically Signed   By: Marijo Conception M.D.   On: 03/23/2019 09:29    Anti-infectives: Anti-infectives (From admission, onward)   Start     Dose/Rate Route Frequency Ordered Stop   03/22/19 1130  ceFAZolin (ANCEF) IVPB 1 g/50 mL premix     1 g 100 mL/hr over 30 Minutes Intravenous Every 6 hours 03/22/19 1125 03/23/19 0529   03/22/19 0830  ceFAZolin (ANCEF) IVPB 2g/100 mL premix     2 g 200 mL/hr over 30 Minutes Intravenous On call to O.R. 03/22/19 0817 03/22/19 0904      Assessment/Plan: BCC R rib FX 1-2, L rib FXs 2-6, 8-10 with B PTX and sternal FX - D/C chest tubes L clavicle FX - S/P ORIF by Dr. Percell Miller 5/25, NWB Facial and L buttock abrasions FEN - tolerating diet VTE - Lovenox Dispo - PT/OT, hope to D/C this PM after F/U CXR  LOS: 4 days    Georganna Skeans, MD, MPH, FACS Trauma & General Surgery: [phone]  03/24/2019

## 2019-04-12 ENCOUNTER — Other Ambulatory Visit: Payer: Self-pay

## 2019-04-12 ENCOUNTER — Ambulatory Visit
Admission: RE | Admit: 2019-04-12 | Discharge: 2019-04-12 | Disposition: A | Discharge status: Home / Self Care | Payer: 59 | Source: Ambulatory Visit | Attending: Physician Assistant | Admitting: Physician Assistant

## 2019-04-12 ENCOUNTER — Other Ambulatory Visit: Payer: Self-pay | Admitting: Physician Assistant

## 2019-04-12 DIAGNOSIS — J939 Pneumothorax, unspecified: Secondary | ICD-10-CM

## 2019-04-28 NOTE — Progress Notes (Deleted)
Subjective:    Patient ID: Paula [name], female    DOB: 1956-02-11, 63 y.o.   MRN: [id number]  HPI:  Paula [name] is here to establish as a new pt.  She is a pleasant 63 year old female. PMH: HLD, Osteoporosis, May 2020- Traumatic Pneumothorax    Patient Care Team    Relationship Specialty Notifications Start End  Hayden Rasmussen, MD PCP - General Family Medicine  01/15/17     Patient Active Problem List   Diagnosis Date Noted  . Pneumothorax, traumatic 03/20/2019  . Neutropenia with fever ([place]) 03/25/2017  . Febrile neutropenia ([place]) 03/25/2017  . Breast cancer of upper-outer quadrant of right female breast ([place]) 01/08/2017  . Menopause 04/03/2014  . Basal cell carcinoma of skin 03/30/2014  . Hyperlipidemia 03/30/2014  . Neck pain 03/30/2014  . Colon polyp 03/30/2014  . Family history of breast cancer in first degree relative  MGM, mother and sister 03/30/2014  . Fibrocystic breast disease 03/30/2014  . Osteoporosis  last Dexa  02/2012 03/30/2014     Past Medical History:  Diagnosis Date  . Candida infection    in bowels  . 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 [name] infection   . History of radiation therapy 04/09/17- 05/01/17   Right Breast 42.56 Gy in 16 fractions  . Hypercholesteremia   . Hypothyroid   . Osteoporosis   . Skin cancer    right side of face basal cell     Past Surgical History:  Procedure Laterality Date  . basal cell skin cancer     to right side of face two times  . BREAST SURGERY     right breast biopsy  . ORIF CLAVICULAR FRACTURE Left 03/22/2019   Procedure: OPEN REDUCTION INTERNAL FIXATION (ORIF) CLAVICULAR FRACTURE;  Surgeon: Renette Butters, MD;  Location: [facility];  Service: Orthopedics;  Laterality: Left;  . TONSILLECTOMY     as a child     Family History  Problem Relation Age of Onset  . Cancer Mother 31       breast  . Cancer Father        pancreatic  . Cancer Sister 23       breast  . Heart  disease Maternal Grandfather   . Cancer Paternal Grandmother 81       lymphoma  . Cancer Paternal Grandfather 74       stomach  . Cancer Maternal Grandmother 95       breast cancer   . Cancer Sister 48       breast cancer      Social History   Substance and Sexual Activity  Drug Use No     Social History   Substance and Sexual Activity  Alcohol Use Yes   Comment: rare wine use     Social History   Tobacco Use  Smoking Status Never Smoker  Smokeless Tobacco Never Used  Tobacco Comment   she smoked socially as a teenager     Outpatient Encounter Medications as of 05/03/2019  Medication Sig Note  . acetaminophen (TYLENOL) 325 MG tablet Take 1-2 tablets (325-650 mg total) by mouth every 6 (six) hours as needed for mild pain (pain score 1-3 or temp > 100.5).   Marland Kitchen ARMOUR THYROID 30 MG tablet Take 75 mg by mouth daily.    . Ascorbic Acid (VITAMIN C) 100 MG tablet Take 600 mg by mouth 6 (six) times daily.    Marland Kitchen  cholecalciferol (VITAMIN D) 1000 UNITS tablet Take 5,000 Units by mouth daily.    Marland Kitchen co-enzyme Q-10 50 MG capsule Take 200 mg by mouth daily.    . Cyanocobalamin (VITAMIN B 12 PO) Take 500 mcg by mouth daily.   . Digestive Enzymes (ENZYMATIC DIGESTANT PO) Take 1 tablet by mouth 3 (three) times daily.   Marland Kitchen GLUTAMINE PO Take by mouth. For leaky gut   . GLUTATHIONE PO Take 250 mg by mouth daily.   Marland Kitchen ibuprofen (ADVIL) 800 MG tablet Take 1 tablet (800 mg total) by mouth every 8 (eight) hours as needed for moderate pain.   . IODINE, KELP, PO Take by mouth. ? Dose 6mg ? 11/26/2017: Takes 5-10 gtts daily  . MAGNESIUM CITRATE PO Take 200 mg by mouth 2 (two) times daily.   . Melatonin 3-2 MG TABS Take 20 mg by mouth every evening. Actual dose is 3-6mg     . methocarbamol (ROBAXIN) 750 MG tablet Take 1 tablet (750 mg total) by mouth every 8 (eight) hours as needed for muscle spasms.   . Methylsulfonylmethane (MSM) POWD Take by mouth. 11/26/2017: One scoop daily  . niacin 50 MG tablet  Take 50 mg by mouth at bedtime.   . Pregnenolone POWD by Does not apply route daily.   Marland Kitchen QUERCETIN PO Take 1,000 mg by mouth 3 (three) times daily. With bromelain 240 mg   . Selenium (V-R SELENIUM) 200 MCG TABS Take by mouth.   . traMADol (ULTRAM) 50 MG tablet Take 1 tablet (50 mg total) by mouth every 6 (six) hours as needed for severe pain.   . TURMERIC PO Take 250 mg by mouth daily. 11/26/2017: Takes 3 tabs TID  . UNABLE TO FIND Med Name: mushrooms 11/26/2017: [place] tail 2 tid, & Lions mane 2 tid  . UNABLE TO FIND Take 600 mg by mouth daily. Med Name:  NAC/Nutricost   . UNABLE TO FIND Take 3 tablets by mouth daily. Med Name: [facility] 722   . vitamin k 100 MCG tablet Take 1 tablet (100 mcg total) by mouth daily.   Marland Kitchen ZINC ASPARTATE PO Take 15 mg by mouth daily. For leaky gut 11/26/2017: Plus copper   No facility-administered encounter medications on file as of 05/03/2019.     Allergies: Patient has no known allergies.  There is no height or weight on file to calculate BMI.  There were no vitals taken for this visit.     Review of Systems     Objective:   Physical Exam        Assessment & Plan:

## 2019-05-03 ENCOUNTER — Ambulatory Visit: Payer: 59 | Admitting: Adult Health

## 2019-05-14 ENCOUNTER — Ambulatory Visit: Payer: 59 | Attending: Orthopedic Surgery | Admitting: Rehabilitation

## 2019-05-14 ENCOUNTER — Encounter: Payer: Self-pay | Admitting: Rehabilitation

## 2019-05-14 ENCOUNTER — Other Ambulatory Visit: Payer: Self-pay

## 2019-05-14 DIAGNOSIS — M546 Pain in thoracic spine: Secondary | ICD-10-CM | POA: Diagnosis present

## 2019-05-14 DIAGNOSIS — M25612 Stiffness of left shoulder, not elsewhere classified: Secondary | ICD-10-CM

## 2019-05-14 DIAGNOSIS — M6281 Muscle weakness (generalized): Secondary | ICD-10-CM | POA: Diagnosis present
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
## 2019-05-14 NOTE — Patient Instructions (Signed)
Access Code: [email or web address]  URL: [email or web address]  Date: 05/14/2019  Prepared by: Shan Levans   Exercises  Standing Shoulder Flexion Wall Walk - 10 reps - 1-3 sets - 3seconds hold - 1x daily - 7x weekly  Standing Shoulder Abduction Finger Walk at Wall - 10 reps - 1-3 sets - 3seconds hold - 1x daily - 7x weekly  Shoulder Flexion Overhead with Dowel - 10 reps - 1-3 sets - 3 seconds hold - 1x daily - 7x weekly  Standing Shoulder Abduction AAROM with Dowel - 10 reps - 1-3 sets - 3 seconds hold - 1x daily - 7x weekly

## 2019-05-26 ENCOUNTER — Other Ambulatory Visit: Payer: Self-pay

## 2019-05-26 ENCOUNTER — Ambulatory Visit: Payer: 59 | Admitting: Rehabilitation

## 2019-05-26 ENCOUNTER — Encounter: Payer: Self-pay | Admitting: Rehabilitation

## 2019-05-26 DIAGNOSIS — M6281 Muscle weakness (generalized): Secondary | ICD-10-CM

## 2019-05-26 DIAGNOSIS — M25612 Stiffness of left shoulder, not elsewhere classified: Secondary | ICD-10-CM | POA: Diagnosis not present

## 2019-05-26 DIAGNOSIS — M546 Pain in thoracic spine: Secondary | ICD-10-CM

## 2019-05-26 NOTE — Patient Instructions (Signed)
Access Code: [email or web address]  URL: [email or web address]  Date: 05/26/2019  Prepared by: Shan Levans   Exercises  Isometric Shoulder Flexion at Wall - 6 reps - 1 sets - 6seconds hold - 1x daily - 7x weekly  Isometric Shoulder External Rotation at Wall - 6 reps - 1 sets - 6 seconds hold - 1x daily - 7x weekly  Standing Isometric Shoulder Internal Rotation at Doorway - 6 reps - 1 sets - 6 seconds hold - 1x daily - 7x weekly  Isometric Shoulder Abduction at Wall - 6 reps - 1 sets - 6 seconds hold - 1x daily - 7x weekly  Isometric Shoulder Extension at Wall - 6 reps - 1 sets - 6 seconds hold - 1x daily - 7x weekly  Standing Row with Anchored Resistance - 10 reps - 2 sets - 4 hold - 1x daily - 7x weekly

## 2019-05-26 NOTE — Therapy (Signed)
[place], [place], [address] Phone: [phone]   Fax:  [phone]  Physical Therapy Treatment  Patient Details  Name: Paula [name] MRN: [id number] Date of Birth: Nov 29, 1955 Referring Provider (PT): Dr. Percell Miller   Encounter Date: 05/26/2019  PT End of Session - 05/26/19 1631    Visit Number  2    Number of Visits  17    Date for PT Re-Evaluation  07/19/19    PT Start Time  0488   pt arrived late   PT Stop Time  1548    PT Time Calculation (min)  38 min    Activity Tolerance  Patient tolerated treatment well    Behavior During Therapy  [facility] for tasks assessed/performed       Past Medical History:  Diagnosis Date  . Candida infection    in bowels  . 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 [name] infection   . History of radiation therapy 04/09/17- 05/01/17   Right Breast 42.56 Gy in 16 fractions  . Hypercholesteremia   . Hypothyroid   . Osteoporosis   . Skin cancer    right side of face basal cell    Past Surgical History:  Procedure Laterality Date  . basal cell skin cancer     to right side of face two times  . BREAST SURGERY     right breast biopsy  . ORIF CLAVICULAR FRACTURE Left 03/22/2019   Procedure: OPEN REDUCTION INTERNAL FIXATION (ORIF) CLAVICULAR FRACTURE;  Surgeon: Renette Butters, MD;  Location: [facility];  Service: Orthopedics;  Laterality: Left;  . TONSILLECTOMY     as a child    There were no vitals filed for this visit.  Subjective Assessment - 05/26/19 1511    Subjective  It feels good to stretch    Pertinent History  sternum fracture, Lt clavicle fracture with ORIF, rib fractures Rt: 1-2 and Lt: 1-6, 8-10 lateral/posterior. The patient had previously undergone right lumpectomy on 12/05/16 at [facility]. Pathology revealed invasive ductal carcinoma, multifocal, spanning 9 mm, 7 mm, and 3 mm, as well as DCIS grade 3 with microcalcification.  Completed XRT  05/01/17.    Currently in Pain?  No/denies         [facility] PT Assessment - 05/26/19 0001      AROM   Left Shoulder Flexion  149 Degrees    Left Shoulder ABduction  169 Degrees                   [facility] Adult PT Treatment/Exercise - 05/26/19 0001      Exercises   Exercises  Shoulder      Shoulder Exercises: Standing   Protraction  Other (comment)   prot/ret at the wall x 10 with mod vcs needed   Row  Both;15 reps    Theraband Level (Shoulder Row)  Level 1 (Yellow)    Other Standing Exercises  5 way isometrics of the Lt shoulderin the doorway with education on using the Rt UE for resistance 6"x6 each      Shoulder Exercises: Pulleys   Flexion  2 minutes                  PT Long Term Goals - 05/26/19 1636      PT LONG TERM GOAL #1   Title  Pt will improve Lt shoulder AROM abduction to at least 160 to equal the opposite side  Status  Achieved      PT LONG TERM GOAL #2   Title  Pt will demonstrate at least 4/5 strength on the Lt side    Status  Not Met      PT LONG TERM GOAL #3   Title  Pt will return to all home activities without limitations from the Lt shoulder    Status  Achieved      PT LONG TERM GOAL #4   Title  Pt will be ind with final HEP    Status  Partially Met            Plan - 05/26/19 1631    Clinical Impression Statement  Pt returns today with full AROM equal to the opposite arm and with no pain.  Added shoulder isometrics, scapular row to HEP for this week.  Pt progressing well    PT Frequency  1x / week    PT Duration  8 weeks    PT Next Visit Plan  how were exercises? pulleys, if isometrics went well add theraband IR, ER, flex, abd, bi, tri below shoulder level  (week 11 on the 3rd)    PT Home Exercise Plan  Access Code: [email or web address]  for isometrics    Consulted and Agree with Plan of Care  Patient       Patient will benefit from skilled therapeutic intervention in order to improve the following deficits and  impairments:     Visit Diagnosis: 1. Stiffness of left shoulder, not elsewhere classified   2. Pain in thoracic spine   3. Muscle weakness (generalized)        Problem List Patient Active Problem List   Diagnosis Date Noted  . Pneumothorax, traumatic 03/20/2019  . Neutropenia with fever ([place]) 03/25/2017  . Febrile neutropenia ([place]) 03/25/2017  . Breast cancer of upper-outer quadrant of right female breast ([place]) 01/08/2017  . Menopause 04/03/2014  . Basal cell carcinoma of skin 03/30/2014  . Hyperlipidemia 03/30/2014  . Neck pain 03/30/2014  . Colon polyp 03/30/2014  . Family history of breast cancer in first degree relative  MGM, mother and sister 03/30/2014  . Fibrocystic breast disease 03/30/2014  . Osteoporosis  last Dexa  02/2012 03/30/2014    Stark Bray 05/26/2019, 4:37 PM  [place], [place], [address] Phone: [phone]   Fax:  [phone]  Name: Paula [name] MRN: [id number] Date of Birth: Jan 09, 1956

## 2019-06-02 ENCOUNTER — Encounter: Payer: 59 | Admitting: Rehabilitation

## 2019-06-04 ENCOUNTER — Other Ambulatory Visit: Payer: Self-pay

## 2019-06-04 ENCOUNTER — Encounter: Payer: Self-pay | Admitting: Physical Therapy

## 2019-06-04 ENCOUNTER — Ambulatory Visit: Payer: 59 | Attending: Orthopedic Surgery | Admitting: Physical Therapy

## 2019-06-04 DIAGNOSIS — M25511 Pain in right shoulder: Secondary | ICD-10-CM | POA: Insufficient documentation

## 2019-06-04 DIAGNOSIS — M25611 Stiffness of right shoulder, not elsewhere classified: Secondary | ICD-10-CM | POA: Diagnosis present

## 2019-06-04 DIAGNOSIS — M25612 Stiffness of left shoulder, not elsewhere classified: Secondary | ICD-10-CM | POA: Diagnosis not present

## 2019-06-04 DIAGNOSIS — M6281 Muscle weakness (generalized): Secondary | ICD-10-CM | POA: Diagnosis present

## 2019-06-04 DIAGNOSIS — M546 Pain in thoracic spine: Secondary | ICD-10-CM | POA: Insufficient documentation

## 2019-06-04 NOTE — Patient Instructions (Signed)
Strengthening: Resisted Flexion    Cancer Rehab [phone]    Hold tubing with left arm at side. Pull forward and up. Move shoulder through pain-free range of motion. Repeat _10___ times per set. Do _1-2___ sessions per day.  Strengthening: Resisted Internal Rotation    Hold tubing in left hand, elbow at side and forearm out. Rotate forearm in across body. Repeat _10___ times per set. Do _1-2___ sessions per day.  Strengthening: Resisted Extension    Hold tubing in left hand, arm forward. Pull arm back, elbow straight. Repeat __10__ times per set. Do __1-2__ sessions per day.   Strengthening: Resisted External Rotation    Hold tubing in left hand, elbow at side and forearm across body. Rotate forearm out. Repeat _10___ times per set. Do __1-2__ sessions per day.  Arm Curl: Single Arm    In stride stance, anchor loop under one foot. With same side hand, thumb up, bend elbow toward shoulder. Repeat 10__ times per set. Repeat with other arm. Do _1_ sets per session. Do _7_ sessions per week.   Lat Pull Down    Tie knot in band and place over door. Face door with knees slightly flexed. Palms down, pull arms down to sides. Straighten elbows.  Repeat _10_ times per set. Do _1_ sets per session. Do _7_ sessions per week. Anchor Height: Over Head  http://tub.exer.us/90   Copyright  VHI. All rights reserved.   http://tub.exer.us/172   Copyright  VHI. All rights reserved.

## 2019-06-04 NOTE — Therapy (Signed)
[place], [place], [address] Phone: [phone]   Fax:  [phone]  Physical Therapy Treatment  Patient Details  Name: Paula [name] MRN: [id number] Date of Birth: [birth year] Referring Provider (PT): Dr. Percell Miller   Encounter Date: 06/04/2019  PT End of Session - 06/04/19 1026    Visit Number  3    Number of Visits  17    Date for PT Re-Evaluation  07/19/19    PT Start Time  7544   pt arrived late   PT Stop Time  1020    PT Time Calculation (min)  43 min    Activity Tolerance  Patient tolerated treatment well    Behavior During Therapy  [facility] for tasks assessed/performed       Past Medical History:  Diagnosis Date  . Candida infection    in bowels  . 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 [name] infection   . History of radiation therapy 04/09/17- 05/01/17   Right Breast 42.56 Gy in 16 fractions  . Hypercholesteremia   . Hypothyroid   . Osteoporosis   . Skin cancer    right side of face basal cell    Past Surgical History:  Procedure Laterality Date  . basal cell skin cancer     to right side of face two times  . BREAST SURGERY     right breast biopsy  . ORIF CLAVICULAR FRACTURE Left 03/22/2019   Procedure: OPEN REDUCTION INTERNAL FIXATION (ORIF) CLAVICULAR FRACTURE;  Surgeon: Renette Butters, MD;  Location: [facility];  Service: Orthopedics;  Laterality: Left;  . TONSILLECTOMY     as a child    There were no vitals filed for this visit.  Subjective Assessment - 06/04/19 0942    Subjective  I did not have any pain with the new exercises.    Pertinent History  sternum fracture, Lt clavicle fracture with ORIF, rib fractures Rt: 1-2 and Lt: 1-6, 8-10 lateral/posterior. The patient had previously undergone right lumpectomy on 12/05/16 at [facility]. 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  Completed XRT 05/01/17.    Patient Stated Goals  get my arm stronger    Currently in Pain?  No/denies                       [facility] Adult PT Treatment/Exercise - 06/04/19 0001      Exercises   Exercises  Shoulder;Elbow      Elbow Exercises   Elbow Flexion  Strengthening;Both;15 reps;Standing;Theraband   pt returned therapist demo   Theraband Level (Elbow Flexion)  Level 2 (Red)    Elbow Extension  Strengthening;Both;15 reps;Standing;Theraband   pt returned therapist demo     Shoulder Exercises: Standing   External Rotation  Strengthening;Both;Theraband;15 reps   pt returned therapist demo   Theraband Level (Shoulder External Rotation)  Level 2 (Red)    Internal Rotation  Strengthening;Both;15 reps;Theraband   pt returned therapist demo   Theraband Level (Shoulder Internal Rotation)  Level 2 (Red)    Flexion  Strengthening;Both;15 reps;Theraband   pt returned therapist demo   Theraband Level (Shoulder Flexion)  Level 2 (Red)    Extension  Strengthening;Both;15 reps;Theraband   pt returned therapist demo   Theraband Level (Shoulder Extension)  Level 2 (Red)    Row  Both;15 reps    Theraband  Level (Shoulder Row)  Level 2 (Red)    Other Standing Exercises  5 way isometrics of the Lt shoulderin the doorway with education on using the Rt UE for resistance 6"x6 each                  PT Long Term Goals - 05/26/19 1636      PT LONG TERM GOAL #1   Title  Pt will improve Lt shoulder AROM abduction to at least 160 to equal the opposite side    Status  Achieved      PT LONG TERM GOAL #2   Title  Pt will demonstrate at least 4/5 strength on the Lt side    Status  Not Met      PT LONG TERM GOAL #3   Title  Pt will return to all home activities without limitations from the Lt shoulder    Status  Achieved      PT LONG TERM GOAL #4   Title  Pt will be ind with final HEP    Status  Partially Met            Plan - 06/04/19 1026     Clinical Impression Statement  Progressed pt's exercises today and added [place] exercises as well as tricep and bicep strengthening with red theraband. Pt did not have any pain with these exercises. Issued new exerciess as part of a home exercise program. She demonstrates full ROM.    Stability/Clinical Decision Making  Stable/Uncomplicated    Rehab Potential  Excellent    PT Frequency  1x / week    PT Duration  8 weeks    PT Treatment/Interventions  Therapeutic exercise    PT Next Visit Plan  week 11 on the 3rd, see how new exercises are going and progress as needed    PT Home Exercise Plan  Access Code: [email or web address]  for isometrics, handout for rockwood, tricep and biceps    Consulted and Agree with Plan of Care  Patient       Patient will benefit from skilled therapeutic intervention in order to improve the following deficits and impairments:  Pain, Decreased mobility, Impaired UE functional use, Decreased range of motion  Visit Diagnosis: 1. Stiffness of left shoulder, not elsewhere classified   2. Muscle weakness (generalized)   3. Stiffness of right shoulder, not elsewhere classified        Problem List Patient Active Problem List   Diagnosis Date Noted  . Pneumothorax, traumatic 03/20/2019  . Neutropenia with fever ([place]) 03/25/2017  . Febrile neutropenia ([place]) 03/25/2017  . Breast cancer of upper-outer quadrant of right female breast ([place]) 01/08/2017  . Menopause 04/03/2014  . Basal cell carcinoma of skin 03/30/2014  . Hyperlipidemia 03/30/2014  . Neck pain 03/30/2014  . Colon polyp 03/30/2014  . Family history of breast cancer in first degree relative  MGM, mother and sister 03/30/2014  . Fibrocystic breast disease 03/30/2014  . Osteoporosis  last Dexa  02/2012 03/30/2014    [facility] 06/04/2019, 10:29 AM  [place], [place], [address] Phone: [phone]   Fax:   [phone]  Name: Paula [name] MRN: [id number] Date of Birth: 1956/02/04  Manus Gunning, PT 06/04/19 10:29 AM

## 2019-06-09 ENCOUNTER — Other Ambulatory Visit: Payer: Self-pay

## 2019-06-09 ENCOUNTER — Ambulatory Visit: Payer: 59 | Admitting: Rehabilitation

## 2019-06-09 ENCOUNTER — Encounter: Payer: Self-pay | Admitting: Rehabilitation

## 2019-06-09 DIAGNOSIS — M25611 Stiffness of right shoulder, not elsewhere classified: Secondary | ICD-10-CM

## 2019-06-09 DIAGNOSIS — M25612 Stiffness of left shoulder, not elsewhere classified: Secondary | ICD-10-CM | POA: Diagnosis not present

## 2019-06-09 DIAGNOSIS — M6281 Muscle weakness (generalized): Secondary | ICD-10-CM

## 2019-06-09 NOTE — Therapy (Signed)
[place], [place], [address] Phone: [phone]   Fax:  [phone]  Physical Therapy Treatment  Patient Details  Name: Paula [name] MRN: [id number] Date of Birth: 02-Mar-1956 Referring Provider (PT): Dr. Percell Miller   Encounter Date: 06/09/2019  PT End of Session - 06/09/19 1556    Visit Number  4    Number of Visits  17    Date for PT Re-Evaluation  07/19/19    PT Start Time  1510    PT Stop Time  1548    PT Time Calculation (min)  38 min    Activity Tolerance  Patient tolerated treatment well    Behavior During Therapy  [facility] for tasks assessed/performed       Past Medical History:  Diagnosis Date  . Candida infection    in bowels  . 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 [name] infection   . History of radiation therapy 04/09/17- 05/01/17   Right Breast 42.56 Gy in 16 fractions  . Hypercholesteremia   . Hypothyroid   . Osteoporosis   . Skin cancer    right side of face basal cell    Past Surgical History:  Procedure Laterality Date  . basal cell skin cancer     to right side of face two times  . BREAST SURGERY     right breast biopsy  . ORIF CLAVICULAR FRACTURE Left 03/22/2019   Procedure: OPEN REDUCTION INTERNAL FIXATION (ORIF) CLAVICULAR FRACTURE;  Surgeon: Renette Butters, MD;  Location: [facility];  Service: Orthopedics;  Laterality: Left;  . TONSILLECTOMY     as a child    There were no vitals filed for this visit.  Subjective Assessment - 06/09/19 1511    Subjective  My shoulder is doing fine.  I wasn't very good with my new routine    Pertinent History  sternum fracture, Lt clavicle fracture with ORIF, rib fractures Rt: 1-2 and Lt: 1-6, 8-10 lateral/posterior. The patient had previously undergone right lumpectomy on 12/05/16 at [facility]. 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  Completed XRT 05/01/17.    Currently in Pain?  No/denies         [facility] PT Assessment - 06/09/19 0001      AROM   Left Shoulder Flexion  140 Degrees    Left Shoulder ABduction  160 Degrees                   [facility] Adult PT Treatment/Exercise - 06/09/19 0001      Elbow Exercises   Elbow Flexion  Left;10 reps    Bar Weights/Barbell (Elbow Flexion)  3 lbs      Shoulder Exercises: Supine   Protraction  Left;10 reps    Protraction Weight (lbs)  2    Flexion  Left;10 reps    Shoulder Flexion Weight (lbs)  2    Diagonals Limitations  a bit of pain per pt     Other Supine Exercises  c/cc circles x 10 each      Shoulder Exercises: Prone   Extension  Left;10 reps    Extension Weight (lbs)  2    Horizontal ABduction 1  Left;10 reps    Horizontal ABduction 1 Weight (lbs)  2      Shoulder Exercises: Standing   Other Standing Exercises  reviewed doorway band exercise  band placement for each movement.       Manual Therapy   Manual Therapy  Passive ROM    Passive ROM  to the Lt shoulder to tolerance                  PT Long Term Goals - 06/09/19 1609      PT LONG TERM GOAL #1   Title  Pt will improve Lt shoulder AROM abduction to at least 160 to equal the opposite side    Status  Achieved      PT LONG TERM GOAL #2   Title  Pt will demonstrate at least 4/5 strength on the Lt side    Status  On-going      PT LONG TERM GOAL #3   Title  Pt will return to all home activities without limitations from the Lt shoulder    Status  Achieved      PT LONG TERM GOAL #4   Title  Pt will be ind with final HEP    Status  On-going            Plan - 06/09/19 1557    Clinical Impression Statement  Pt had lost 9 degrees of AROM into flexion and abduction but regained them with treatment today.  Reminded pt of importance on continuing to do exercises even when having no limitations or pain at home.  Performed weight and prone new exercises well     PT Treatment/Interventions  Therapeutic exercise;Manual techniques;Passive range of motion    PT Next Visit Plan  week 13 on the 18th.  continue Lt shoulder ROM and strengthening progressing towards HEP    Consulted and Agree with Plan of Care  Patient       Patient will benefit from skilled therapeutic intervention in order to improve the following deficits and impairments:     Visit Diagnosis: 1. Stiffness of left shoulder, not elsewhere classified   2. Muscle weakness (generalized)   3. Stiffness of right shoulder, not elsewhere classified        Problem List Patient Active Problem List   Diagnosis Date Noted  . Pneumothorax, traumatic 03/20/2019  . Neutropenia with fever ([place]) 03/25/2017  . Febrile neutropenia ([place]) 03/25/2017  . Breast cancer of upper-outer quadrant of right female breast ([place]) 01/08/2017  . Menopause 04/03/2014  . Basal cell carcinoma of skin 03/30/2014  . Hyperlipidemia 03/30/2014  . Neck pain 03/30/2014  . Colon polyp 03/30/2014  . Family history of breast cancer in first degree relative  MGM, mother and sister 03/30/2014  . Fibrocystic breast disease 03/30/2014  . Osteoporosis  last Dexa  02/2012 03/30/2014    Stark Bray 06/09/2019, 4:09 PM  [place], [place], [address] Phone: [phone]   Fax:  [phone]  Name: Henya Aguallo MRN: [id number] Date of Birth: 10-Apr-1956

## 2019-06-14 ENCOUNTER — Ambulatory Visit: Payer: 59 | Admitting: Rehabilitation

## 2019-06-17 ENCOUNTER — Other Ambulatory Visit: Payer: Self-pay

## 2019-06-17 ENCOUNTER — Encounter: Payer: Self-pay | Admitting: Physical Therapy

## 2019-06-17 ENCOUNTER — Ambulatory Visit: Payer: 59 | Admitting: Physical Therapy

## 2019-06-17 DIAGNOSIS — M546 Pain in thoracic spine: Secondary | ICD-10-CM

## 2019-06-17 DIAGNOSIS — M6281 Muscle weakness (generalized): Secondary | ICD-10-CM

## 2019-06-17 DIAGNOSIS — M25611 Stiffness of right shoulder, not elsewhere classified: Secondary | ICD-10-CM

## 2019-06-17 DIAGNOSIS — M25511 Pain in right shoulder: Secondary | ICD-10-CM

## 2019-06-17 NOTE — Patient Instructions (Signed)
Access Code: [email or web address]  URL: [email or web address]  Date: 06/17/2019  Prepared by: Maudry Diego   Program Notes  yellow band around both hands. Stabalize right hand and reach left hand to 12 oclock , 9 oclock and 7 oclock Try to work up to 10 repetitions    [email or web address]   Exercises Wall Push Up with Plus - 5 reps - 1x daily - 3x weekly [place] Salutation Flow with Bent Knees - 10 reps - 3 sets - 1x daily - 7x weekly Prone Elbow Extension with Dumbbell - 10 reps - 3 sets - 1x daily - 7x weekly Supine Single Arm Shoulder Protraction - 10 reps - 3 sets - 1x daily - 7x weekly

## 2019-06-17 NOTE — Therapy (Signed)
[place], [place], [address] Phone: [phone]   Fax:  [phone]  Physical Therapy Treatment  Patient Details  Name: Paula [name] MRN: [id number] Date of Birth: 1955/12/07 Referring Provider (PT): Dr. Percell Miller   Encounter Date: 06/17/2019  PT End of Session - 06/17/19 1553    Visit Number  5    Number of Visits  17    Date for PT Re-Evaluation  07/19/19    PT Start Time  1430    PT Stop Time  1515    PT Time Calculation (min)  45 min    Activity Tolerance  Patient tolerated treatment well    Behavior During Therapy  [facility] for tasks assessed/performed       Past Medical History:  Diagnosis Date  . Candida infection    in bowels  . 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 [name] infection   . History of radiation therapy 04/09/17- 05/01/17   Right Breast 42.56 Gy in 16 fractions  . Hypercholesteremia   . Hypothyroid   . Osteoporosis   . Skin cancer    right side of face basal cell    Past Surgical History:  Procedure Laterality Date  . basal cell skin cancer     to right side of face two times  . BREAST SURGERY     right breast biopsy  . ORIF CLAVICULAR FRACTURE Left 03/22/2019   Procedure: OPEN REDUCTION INTERNAL FIXATION (ORIF) CLAVICULAR FRACTURE;  Surgeon: Renette Butters, MD;  Location: [facility];  Service: Orthopedics;  Laterality: Left;  . TONSILLECTOMY     as a child    There were no vitals filed for this visit.  Subjective Assessment - 06/17/19 1536    Subjective  Pt reports she is doing fine She thinks she is ready to discharge from PT    Pertinent History  sternum fracture, Lt clavicle fracture with ORIF, rib fractures Rt: 1-2 and Lt: 1-6, 8-10 lateral/posterior. The patient had previously undergone right lumpectomy on 12/05/16 at [facility]. 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  Completed XRT 05/01/17.    Currently in Pain?  No/denies                       [facility] Adult PT Treatment/Exercise - 06/17/19 0001      Exercises   Exercises  Other Exercises    Other Exercises   gave pt copy of Strength ABC exercise program as she is familiar with exercise and interested in progressing her program  issued Medbridge copy of exercises       Pilates   Other Pilates  sun salutation yoga series with modified downward dog on mat rather than floor    pt did well with weight bearing on arms      Shoulder Exercises: Standing   Other Standing Exercises  yellow band around hands  right hand stabalized and pt reached left hand to 12 oclock, 9 oclock and 7 oclocl     Other Standing Exercises  wall slides with forearms on wall with yellow theraband around wall              PT Education - 06/17/19 1550    Education Details  progressive home exercise program    Person(s) Educated  Patient    Methods  Explanation  Comprehension  Verbalized understanding;Returned demonstration          PT Long Term Goals - 06/17/19 1554      PT LONG TERM GOAL #1   Title  Pt will improve Lt shoulder AROM abduction to at least 160 to equal the opposite side    Period  Weeks    Status  Achieved      PT LONG TERM GOAL #2   Title  Pt will demonstrate at least 4/5 strength on the Lt side    Time  8    Period  Weeks    Status  Achieved      PT LONG TERM GOAL #3   Title  Pt will return to all home activities without limitations from the Lt shoulder    Time  8    Period  Weeks    Status  Achieved      PT LONG TERM GOAL #4   Title  Pt will be ind with final HEP    Time  8    Period  Weeks    Status  Achieved            Plan - 06/17/19 1551    Clinical Impression Statement  Pt is doing very well with home exercises and is feeling well. Upgraded to more UE weight bearing, basic yoga poses to progress to more of her usual activitiy.  She is  ready to discharge from PT    Examination-Participation Restrictions  Yard Work;Community Activity    Stability/Clinical Decision Making  Stable/Uncomplicated    PT Next Visit Plan  discharge this episode    Consulted and Agree with Plan of Care  Patient       Patient will benefit from skilled therapeutic intervention in order to improve the following deficits and impairments:     Visit Diagnosis: Muscle weakness (generalized)  Stiffness of right shoulder, not elsewhere classified  Pain in thoracic spine  Right shoulder pain, unspecified chronicity     Problem List Patient Active Problem List   Diagnosis Date Noted  . Pneumothorax, traumatic 03/20/2019  . Neutropenia with fever ([place]) 03/25/2017  . Febrile neutropenia ([place]) 03/25/2017  . Breast cancer of upper-outer quadrant of right female breast ([place]) 01/08/2017  . Menopause 04/03/2014  . Basal cell carcinoma of skin 03/30/2014  . Hyperlipidemia 03/30/2014  . Neck pain 03/30/2014  . Colon polyp 03/30/2014  . Family history of breast cancer in first degree relative  MGM, mother and sister 03/30/2014  . Fibrocystic breast disease 03/30/2014  . Osteoporosis  last Dexa  02/2012 03/30/2014      PHYSICAL THERAPY DISCHARGE SUMMARY  Visits from Start of Care: 5  Current functional level related to goals / functional outcomes: Pt independent    Remaining deficits: none   Education / Equipment: Home exercise   Plan: Patient agrees to discharge.  Patient goals were not met. Patient is being discharged due to being pleased with the current functional level.  ?????    Donato Heinz. Owens Shark PT  [facility] Levo 06/17/2019, 3:59 PM  [place], [place], [address] Phone: [phone]   Fax:  [phone]  Name: Paula [name] MRN: [id number] Date of Birth: [birth year]

## 2019-06-24 ENCOUNTER — Ambulatory Visit: Payer: 59 | Admitting: Rehabilitation

## 2019-08-13 ENCOUNTER — Other Ambulatory Visit: Payer: Self-pay

## 2019-08-13 DIAGNOSIS — Z20822 Contact with and (suspected) exposure to covid-19: Secondary | ICD-10-CM

## 2019-08-15 LAB — NOVEL CORONAVIRUS, NAA: SARS-CoV-2, NAA: NOT DETECTED

## 2019-09-22 LAB — T4, FREE: Free T4: 0.99

## 2019-09-22 LAB — LIPID PANEL
Cholesterol: 179 (ref 0–200)
HDL: 59 (ref 35–70)
LDL Cholesterol: 109
Triglycerides: 59 (ref 40–160)

## 2019-09-22 LAB — BASIC METABOLIC PANEL
BUN: 8 (ref 4–21)
CO2: 23 — AB (ref 13–22)
Chloride: 104 (ref 99–108)
Creatinine: 0.6 (ref 0.5–1.1)
Glucose: 78
Potassium: 4 (ref 3.4–5.3)
Sodium: 140 (ref 137–147)

## 2019-09-22 LAB — CBC AND DIFFERENTIAL
HCT: 39 (ref 36–46)
Hemoglobin: 12.9 (ref 12.0–16.0)
Neutrophils Absolute: 3
Platelets: 269 (ref 150–399)
WBC: 4.9

## 2019-09-22 LAB — HEPATIC FUNCTION PANEL
ALT: 34 (ref 7–35)
AST: 30 (ref 13–35)
Alkaline Phosphatase: 145 — AB (ref 25–125)

## 2019-09-22 LAB — TSH: TSH: 2.92 (ref 0.41–5.90)

## 2019-09-22 LAB — CBC: RBC: 4.17 (ref 3.87–5.11)

## 2019-09-22 LAB — HEMOGLOBIN A1C: Hemoglobin A1C: 5

## 2019-09-22 LAB — TESTOSTERONE: Testosterone: 56

## 2019-09-22 LAB — VITAMIN D 25 HYDROXY (VIT D DEFICIENCY, FRACTURES): Vit D, 25-Hydroxy: 68.4

## 2019-09-28 ENCOUNTER — Other Ambulatory Visit: Payer: Self-pay

## 2019-09-28 ENCOUNTER — Ambulatory Visit (INDEPENDENT_AMBULATORY_CARE_PROVIDER_SITE_OTHER): Payer: 59 | Admitting: Adult Health

## 2019-09-28 ENCOUNTER — Encounter: Payer: Self-pay | Admitting: Adult Health

## 2019-09-28 VITALS — BP 94/61 | HR 82 | Temp 98.6°F | Ht 63.5 in | Wt 117.4 lb

## 2019-09-28 DIAGNOSIS — C50411 Malignant neoplasm of upper-outer quadrant of right female breast: Secondary | ICD-10-CM

## 2019-09-28 DIAGNOSIS — B89 Unspecified parasitic disease: Secondary | ICD-10-CM | POA: Diagnosis not present

## 2019-09-28 DIAGNOSIS — Z171 Estrogen receptor negative status [ER-]: Secondary | ICD-10-CM

## 2019-09-28 DIAGNOSIS — Z8619 Personal history of other infectious and parasitic diseases: Secondary | ICD-10-CM | POA: Diagnosis not present

## 2019-09-28 DIAGNOSIS — Z0000 Encounter for general adult medical examination without abnormal findings: Secondary | ICD-10-CM | POA: Diagnosis not present

## 2019-09-28 DIAGNOSIS — E785 Hyperlipidemia, unspecified: Secondary | ICD-10-CM

## 2019-09-28 NOTE — Patient Instructions (Signed)
Mediterranean Diet A Mediterranean diet refers to food and lifestyle choices that are based on the traditions of countries located on the [employer]. This way of eating has been shown to help prevent certain conditions and improve outcomes for people who have chronic diseases, like kidney disease and heart disease. What are tips for following this plan? Lifestyle  Cook and eat meals together with your family, when possible.  Drink enough fluid to keep your urine clear or pale yellow.  Be physically active every day. This includes: ? Aerobic exercise like running or swimming. ? Leisure activities like gardening, walking, or housework.  Get 7-8 hours of sleep each night.  If recommended by your health care provider, drink red wine in moderation. This means 1 glass a day for nonpregnant women and 2 glasses a day for men. A glass of wine equals 5 oz (150 mL). Reading food labels   Check the serving size of packaged foods. For foods such as rice and pasta, the serving size refers to the amount of cooked product, not dry.  Check the total fat in packaged foods. Avoid foods that have saturated fat or trans fats.  Check the ingredients list for added sugars, such as corn syrup. Shopping  At the grocery store, buy most of your food from the areas near the walls of the store. This includes: ? Fresh fruits and vegetables (produce). ? Grains, beans, nuts, and seeds. Some of these may be available in unpackaged forms or large amounts (in bulk). ? Fresh seafood. ? Poultry and eggs. ? Low-fat dairy products.  Buy whole ingredients instead of prepackaged foods.  Buy fresh fruits and vegetables in-season from local farmers markets.  Buy frozen fruits and vegetables in resealable bags.  If you do not have access to quality fresh seafood, buy precooked frozen shrimp or canned fish, such as tuna, salmon, or sardines.  Buy small amounts of raw or cooked vegetables, salads, or olives from  the deli or salad bar at your store.  Stock your pantry so you always have certain foods on hand, such as olive oil, canned tuna, canned tomatoes, rice, pasta, and beans. Cooking  Cook foods with extra-virgin olive oil instead of using butter or other vegetable oils.  Have meat as a side dish, and have vegetables or grains as your main dish. This means having meat in small portions or adding small amounts of meat to foods like pasta or stew.  Use beans or vegetables instead of meat in common dishes like chili or lasagna.  Experiment with different cooking methods. Try roasting or broiling vegetables instead of steaming or sauteing them.  Add frozen vegetables to soups, stews, pasta, or rice.  Add nuts or seeds for added healthy fat at each meal. You can add these to yogurt, salads, or vegetable dishes.  Marinate fish or vegetables using olive oil, lemon juice, garlic, and fresh herbs. Meal planning   Plan to eat 1 vegetarian meal one day each week. Try to work up to 2 vegetarian meals, if possible.  Eat seafood 2 or more times a week.  Have healthy snacks readily available, such as: ? Vegetable sticks with hummus. ? [place] yogurt. ? Fruit and nut trail mix.  Eat balanced meals throughout the week. This includes: ? Fruit: 2-3 servings a day ? Vegetables: 4-5 servings a day ? Low-fat dairy: 2 servings a day ? Fish, poultry, or lean meat: 1 serving a day ? Beans and legumes: 2 or more servings a week ?  Nuts and seeds: 1-2 servings a day ? Whole grains: 6-8 servings a day ? Extra-virgin olive oil: 3-4 servings a day  Limit red meat and sweets to only a few servings a month What are my food choices?  Mediterranean diet ? Recommended  Grains: Whole-grain pasta. Brown rice. Bulgar wheat. Polenta. Couscous. Whole-wheat bread. [place].  Vegetables: Artichokes. Beets. Broccoli. Cabbage. Carrots. Eggplant. Green beans. Chard. Kale. Spinach. Onions. Leeks. Peas. Squash.  Tomatoes. Peppers. Radishes.  Fruits: Apples. Apricots. Avocado. Berries. Bananas. Cherries. Dates. Figs. Grapes. Lemons. Melon. Oranges. Peaches. Plums. Pomegranate.  Meats and other protein foods: Beans. Almonds. Sunflower seeds. Pine nuts. Peanuts. [place]. Salmon. [place]all[place]. Shrimp. [place]. Tilapia. Clams. Oysters. Eggs.  Dairy: Low-fat milk. Cheese. Greek yogurt.  Beverages: Water. Red wine. Herbal tea.  Fats and oils: Extra virgin olive oil. Avocado oil. Grape seed oil.  Sweets and desserts: [place] yogurt with honey. Baked apples. Poached pears. Trail mix.  Seasoning and other foods: Basil. Cilantro. Coriander. Cumin. Mint. Parsley. Sage. Rosemary. Tarragon. Garlic. Oregano. Thyme. Pepper. Balsalmic vinegar. Tahini. Hummus. Tomato sauce. Olives. Mushrooms. ? Limit these  Grains: Prepackaged pasta or rice dishes. Prepackaged cereal with added sugar.  Vegetables: Deep fried potatoes (french fries).  Fruits: Fruit canned in syrup.  Meats and other protein foods: Beef. Pork. Lamb. Poultry with skin. Hot dogs. Berniece Salines.  Dairy: Ice cream. Sour cream. Whole milk.  Beverages: Juice. Sugar-sweetened soft drinks. Beer. Liquor and spirits.  Fats and oils: Butter. Canola oil. Vegetable oil. Beef fat (tallow). Lard.  Sweets and desserts: Cookies. Cakes. Pies. Candy.  Seasoning and other foods: Mayonnaise. Premade sauces and marinades. The items listed may not be a complete list. Talk with your dietitian about what dietary choices are right for you. Summary  The Mediterranean diet includes both food and lifestyle choices.  Eat a variety of fresh fruits and vegetables, beans, nuts, seeds, and whole grains.  Limit the amount of red meat and sweets that you eat.  Talk with your health care provider about whether it is safe for you to drink red wine in moderation. This means 1 glass a day for nonpregnant women and 2 glasses a day for men. A glass of wine equals 5 oz (150 mL). This information  is not intended to replace advice given to you by your health care provider. Make sure you discuss any questions you have with your health care provider. Document Released: 06/06/2016 Document Revised: 06/13/2016 Document Reviewed: 06/06/2016 Elsevier Patient Education  2020 [place] well hydrated, follow Mediterranean diet. Continue all medications as directed. Please schedule complete physical in late dec, early Jan- please bring all lab work that you recently had completed with [place]. Referral placed to Gastroenterology to address abdominal discomfort and history or parasites. Continue to social distance and wear a mask when in public. WELCOME TO THE PRACTICE!

## 2019-09-28 NOTE — Assessment & Plan Note (Signed)
Previously refused statin therapy

## 2019-09-28 NOTE — Assessment & Plan Note (Signed)
Mammogram scheduled for Jan 2021

## 2019-09-28 NOTE — Assessment & Plan Note (Signed)
Referral to GI placed

## 2019-09-28 NOTE — Assessment & Plan Note (Signed)
Remain well hydrated, follow Mediterranean diet. Continue all medications as directed. Please schedule complete physical in late dec, early Jan- please bring all lab work that you recently had completed with [facility]. Referral placed to Gastroenterology to address abdominal discomfort and history or parasites. Continue to social distance and wear a mask when in public.
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
## 2019-09-29 ENCOUNTER — Encounter: Payer: Self-pay | Admitting: Physician Assistant

## 2019-10-28 ENCOUNTER — Other Ambulatory Visit: Payer: Self-pay

## 2019-10-28 ENCOUNTER — Encounter: Payer: Self-pay | Admitting: Adult Health

## 2019-10-28 ENCOUNTER — Ambulatory Visit: Payer: 59 | Admitting: Physician Assistant

## 2019-10-28 ENCOUNTER — Ambulatory Visit (INDEPENDENT_AMBULATORY_CARE_PROVIDER_SITE_OTHER): Payer: Managed Care, Other (non HMO) | Admitting: Adult Health

## 2019-10-28 VITALS — BP 108/67 | HR 91 | Temp 98.5°F | Ht 64.0 in | Wt 117.1 lb

## 2019-10-28 DIAGNOSIS — Z638 Other specified problems related to primary support group: Secondary | ICD-10-CM | POA: Diagnosis not present

## 2019-10-28 DIAGNOSIS — Z0000 Encounter for general adult medical examination without abnormal findings: Secondary | ICD-10-CM

## 2019-10-28 DIAGNOSIS — R5383 Other fatigue: Secondary | ICD-10-CM

## 2019-10-28 DIAGNOSIS — Z0001 Encounter for general adult medical examination with abnormal findings: Secondary | ICD-10-CM

## 2019-10-28 DIAGNOSIS — Z8619 Personal history of other infectious and parasitic diseases: Secondary | ICD-10-CM

## 2019-10-28 DIAGNOSIS — R748 Abnormal levels of other serum enzymes: Secondary | ICD-10-CM

## 2019-10-28 LAB — POCT GLYCOSYLATED HEMOGLOBIN (HGB A1C): Hemoglobin A1C: 4.8 % (ref 4.0–5.6)

## 2019-10-28 NOTE — Assessment & Plan Note (Signed)
Alk Phos 145- advised her to stop her myriad of supplements and use one FDA approved OTC Multivitamin  Will re-check Hepatic Fx Panel in 4 weeks Keep f/u with GI next week

## 2019-10-28 NOTE — Assessment & Plan Note (Signed)
May be contributing to low normal A1c KEEP f/u with GI next week!

## 2019-10-28 NOTE — Patient Instructions (Addendum)
Preventive Care for Adults, Female  A healthy lifestyle and preventive care can promote health and wellness. Preventive health guidelines for women include the following key practices.   A routine yearly physical is a good way to check with your health care provider about your health and preventive screening. It is a chance to share any concerns and updates on your health and to receive a thorough exam.   Visit your dentist for a routine exam and preventive care every 6 months. Brush your teeth twice a day and floss once a day. Good oral hygiene prevents tooth decay and gum disease.   The frequency of eye exams is based on your age, health, family medical history, use of contact lenses, and other factors. Follow your health care provider's recommendations for frequency of eye exams.   Eat a healthy diet. Foods like vegetables, fruits, whole grains, low-fat dairy products, and lean protein foods contain the nutrients you need without too many calories. Decrease your intake of foods high in solid fats, added sugars, and salt. Eat the right amount of calories for you.Get information about a proper diet from your health care provider, if necessary.   Regular physical exercise is one of the most important things you can do for your health. Most adults should get at least 150 minutes of moderate-intensity exercise (any activity that increases your heart rate and causes you to sweat) each week. In addition, most adults need muscle-strengthening exercises on 2 or more days a week.   Maintain a healthy weight. The body mass index (BMI) is a screening tool to identify possible weight problems. It provides an estimate of body fat based on height and weight. Your health care provider can find your BMI, and can help you achieve or maintain a healthy weight.For adults 20 years and older:   - A BMI below 18.5 is considered underweight.   - A BMI of 18.5 to 24.9 is normal.   - A BMI of 25 to 29.9 is  considered overweight.   - A BMI of 30 and above is considered obese.   Maintain normal blood lipids and cholesterol levels by exercising and minimizing your intake of trans and saturated fats.  Eat a balanced diet with plenty of fruit and vegetables. Blood tests for lipids and cholesterol should begin at age 20 and be repeated every 5 years minimum.  If your lipid or cholesterol levels are high, you are over 40, or you are at high risk for heart disease, you may need your cholesterol levels checked more frequently.Ongoing high lipid and cholesterol levels should be treated with medicines if diet and exercise are not working.   If you smoke, find out from your health care provider how to quit. If you do not use tobacco, do not start.   Lung cancer screening is recommended for adults aged 55-80 years who are at high risk for developing lung cancer because of a history of smoking. A yearly low-dose CT scan of the lungs is recommended for people who have at least a 30-pack-year history of smoking and are a current smoker or have quit within the past 15 years. A pack year of smoking is smoking an average of 1 pack of cigarettes a day for 1 year (for example: 1 pack a day for 30 years or 2 packs a day for 15 years). Yearly screening should continue until the smoker has stopped smoking for at least 15 years. Yearly screening should be stopped for people who develop a   health problem that would prevent them from having lung cancer treatment.   If you are pregnant, do not drink alcohol. If you are breastfeeding, be very cautious about drinking alcohol. If you are not pregnant and choose to drink alcohol, do not have more than 1 drink per day. One drink is considered to be 12 ounces (355 mL) of beer, 5 ounces (148 mL) of wine, or 1.5 ounces (44 mL) of liquor.   Avoid use of street drugs. Do not share needles with anyone. Ask for help if you need support or instructions about stopping the use of  drugs.   High blood pressure causes heart disease and increases the risk of stroke. Your blood pressure should be checked at least yearly.  Ongoing high blood pressure should be treated with medicines if weight loss and exercise do not work.   If you are 69-55 years old, ask your health care provider if you should take aspirin to prevent strokes.   Diabetes screening involves taking a blood sample to check your fasting blood sugar level. This should be done once every 3 years, after age 38, if you are within normal weight and without risk factors for diabetes. Testing should be considered at a younger age or be carried out more frequently if you are overweight and have at least 1 risk factor for diabetes.   Breast cancer screening is essential preventive care for women. You should practice "breast self-awareness."  This means understanding the normal appearance and feel of your breasts and may include breast self-examination.  Any changes detected, no matter how small, should be reported to a health care provider.  Women in their 80s and 30s should have a clinical breast exam (CBE) by a health care provider as part of a regular health exam every 1 to 3 years.  After age 66, women should have a CBE every year.  Starting at age 1, women should consider having a mammogram (breast X-ray test) every year.  Women who have a family history of breast cancer should talk to their health care provider about genetic screening.  Women at a high risk of breast cancer should talk to their health care providers about having an MRI and a mammogram every year.   -Breast cancer gene (BRCA)-related cancer risk assessment is recommended for women who have family members with BRCA-related cancers. BRCA-related cancers include breast, ovarian, tubal, and peritoneal cancers. Having family members with these cancers may be associated with an increased risk for harmful changes (mutations) in the breast cancer genes BRCA1 and  BRCA2. Results of the assessment will determine the need for genetic counseling and BRCA1 and BRCA2 testing.   The Pap test is a screening test for cervical cancer. A Pap test can show cell changes on the cervix that might become cervical cancer if left untreated. A Pap test is a procedure in which cells are obtained and examined from the lower end of the uterus (cervix).   - Women should have a Pap test starting at age 57.   - Between ages 90 and 70, Pap tests should be repeated every 2 years.   - Beginning at age 63, you should have a Pap test every 3 years as long as the past 3 Pap tests have been normal.   - Some women have medical problems that increase the chance of getting cervical cancer. Talk to your health care provider about these problems. It is especially important to talk to your health care provider if a  new problem develops soon after your last Pap test. In these cases, your health care provider may recommend more frequent screening and Pap tests.   - The above recommendations are the same for women who have or have not gotten the vaccine for human papillomavirus (HPV).   - If you had a hysterectomy for a problem that was not cancer or a condition that could lead to cancer, then you no longer need Pap tests. Even if you no longer need a Pap test, a regular exam is a good idea to make sure no other problems are starting.   - If you are between ages 36 and 66 years, and you have had normal Pap tests going back 10 years, you no longer need Pap tests. Even if you no longer need a Pap test, a regular exam is a good idea to make sure no other problems are starting.   - If you have had past treatment for cervical cancer or a condition that could lead to cancer, you need Pap tests and screening for cancer for at least 20 years after your treatment.   - If Pap tests have been discontinued, risk factors (such as a new sexual partner) need to be reassessed to determine if screening should  be resumed.   - The HPV test is an additional test that may be used for cervical cancer screening. The HPV test looks for the virus that can cause the cell changes on the cervix. The cells collected during the Pap test can be tested for HPV. The HPV test could be used to screen women aged 70 years and older, and should be used in women of any age who have unclear Pap test results. After the age of 67, women should have HPV testing at the same frequency as a Pap test.   Colorectal cancer can be detected and often prevented. Most routine colorectal cancer screening begins at the age of 57 years and continues through age 26 years. However, your health care provider may recommend screening at an earlier age if you have risk factors for colon cancer. On a yearly basis, your health care provider may provide home test kits to check for hidden blood in the stool.  Use of a small camera at the end of a tube, to directly examine the colon (sigmoidoscopy or colonoscopy), can detect the earliest forms of colorectal cancer. Talk to your health care provider about this at age 23, when routine screening begins. Direct exam of the colon should be repeated every 5 -10 years through age 49 years, unless early forms of pre-cancerous polyps or small growths are found.   People who are at an increased risk for hepatitis B should be screened for this virus. You are considered at high risk for hepatitis B if:  -You were born in a country where hepatitis B occurs often. Talk with your health care provider about which countries are considered high risk.  - Your parents were born in a high-risk country and you have not received a shot to protect against hepatitis B (hepatitis B vaccine).  - You have HIV or AIDS.  - You use needles to inject street drugs.  - You live with, or have sex with, someone who has Hepatitis B.  - You get hemodialysis treatment.  - You take certain medicines for conditions like cancer, organ  transplantation, and autoimmune conditions.   Hepatitis C blood testing is recommended for all people born from [birth year] through 1965 and any individual  with known risks for hepatitis C.   Practice safe sex. Use condoms and avoid high-risk sexual practices to reduce the spread of sexually transmitted infections (STIs). STIs include gonorrhea, chlamydia, syphilis, trichomonas, herpes, HPV, and human immunodeficiency virus (HIV). Herpes, HIV, and HPV are viral illnesses that have no cure. They can result in disability, cancer, and death. Sexually active women aged 25 years and younger should be checked for chlamydia. Older women with new or multiple partners should also be tested for chlamydia. Testing for other STIs is recommended if you are sexually active and at increased risk.   Osteoporosis is a disease in which the bones lose minerals and strength with aging. This can result in serious bone fractures or breaks. The risk of osteoporosis can be identified using a bone density scan. Women ages 65 years and over and women at risk for fractures or osteoporosis should discuss screening with their health care providers. Ask your health care provider whether you should take a calcium supplement or vitamin D to There are also several preventive steps women can take to avoid osteoporosis and resulting fractures or to keep osteoporosis from worsening. -->Recommendations include:  Eat a balanced diet high in fruits, vegetables, calcium, and vitamins.  Get enough calcium. The recommended total intake of is 1,200 mg daily; for best absorption, if taking supplements, divide doses into 250-500 mg doses throughout the day. Of the two types of calcium, calcium carbonate is best absorbed when taken with food but calcium citrate can be taken on an empty stomach.  Get enough vitamin D. NAMS and the National Osteoporosis Foundation recommend at least 1,000 IU per day for women age 50 and over who are at risk of vitamin D  deficiency. Vitamin D deficiency can be caused by inadequate sun exposure (for example, those who live in northern latitudes).  Avoid alcohol and smoking. Heavy alcohol intake (more than 7 drinks per week) increases the risk of falls and hip fracture and women smokers tend to lose bone more rapidly and have lower bone mass than nonsmokers. Stopping smoking is one of the most important changes women can make to improve their health and decrease risk for disease.  Be physically active every day. Weight-bearing exercise (for example, fast walking, hiking, jogging, and weight training) may strengthen bones or slow the rate of bone loss that comes with aging. Balancing and muscle-strengthening exercises can reduce the risk of falling and fracture.  Consider therapeutic medications. Currently, several types of effective drugs are available. Healthcare providers can recommend the type most appropriate for each woman.  Eliminate environmental factors that may contribute to accidents. Falls cause nearly 90% of all osteoporotic fractures, so reducing this risk is an important bone-health strategy. Measures include ample lighting, removing obstructions to walking, using nonskid rugs on floors, and placing mats and/or grab bars in showers.  Be aware of medication side effects. Some common medicines make bones weaker. These include a type of steroid drug called glucocorticoids used for arthritis and asthma, some antiseizure drugs, certain sleeping pills, treatments for endometriosis, and some cancer drugs. An overactive thyroid gland or using too much thyroid hormone for an underactive thyroid can also be a problem. If you are taking these medicines, talk to your doctor about what you can do to help protect your bones.reduce the rate of osteoporosis.    Menopause can be associated with physical symptoms and risks. Hormone replacement therapy is available to decrease symptoms and risks. You should talk to your  health care provider   about whether hormone replacement therapy is right for you.   Use sunscreen. Apply sunscreen liberally and repeatedly throughout the day. You should seek shade when your shadow is shorter than you. Protect yourself by wearing long sleeves, pants, a wide-brimmed hat, and sunglasses year round, whenever you are outdoors.   Once a month, do a whole body skin exam, using a mirror to look at the skin on your back. Tell your health care provider of new moles, moles that have irregular borders, moles that are larger than a pencil eraser, or moles that have changed in shape or color.   -Stay current with required vaccines (immunizations).   Influenza vaccine. All adults should be immunized every year.  Tetanus, diphtheria, and acellular pertussis (Td, Tdap) vaccine. Pregnant women should receive 1 dose of Tdap vaccine during each pregnancy. The dose should be obtained regardless of the length of time since the last dose. Immunization is preferred during the 27th 36th week of gestation. An adult who has not previously received Tdap or who does not know her vaccine status should receive 1 dose of Tdap. This initial dose should be followed by tetanus and diphtheria toxoids (Td) booster doses every 10 years. Adults with an unknown or incomplete history of completing a 3-dose immunization series with Td-containing vaccines should begin or complete a primary immunization series including a Tdap dose. Adults should receive a Td booster every 10 years.  Varicella vaccine. An adult without evidence of immunity to varicella should receive 2 doses or a second dose if she has previously received 1 dose. Pregnant females who do not have evidence of immunity should receive the first dose after pregnancy. This first dose should be obtained before leaving the health care facility. The second dose should be obtained 4 8 weeks after the first dose.  Human papillomavirus (HPV) vaccine. Females aged 13 26  years who have not received the vaccine previously should obtain the 3-dose series. The vaccine is not recommended for use in pregnant females. However, pregnancy testing is not needed before receiving a dose. If a female is found to be pregnant after receiving a dose, no treatment is needed. In that case, the remaining doses should be delayed until after the pregnancy. Immunization is recommended for any person with an immunocompromised condition through the age of 26 years if she did not get any or all doses earlier. During the 3-dose series, the second dose should be obtained 4 8 weeks after the first dose. The third dose should be obtained 24 weeks after the first dose and 16 weeks after the second dose.  Zoster vaccine. One dose is recommended for adults aged 60 years or older unless certain conditions are present.  Measles, mumps, and rubella (MMR) vaccine. Adults born before 1957 generally are considered immune to measles and mumps. Adults born in 1957 or later should have 1 or more doses of MMR vaccine unless there is a contraindication to the vaccine or there is laboratory evidence of immunity to each of the three diseases. A routine second dose of MMR vaccine should be obtained at least 28 days after the first dose for students attending postsecondary schools, health care workers, or international travelers. People who received inactivated measles vaccine or an unknown type of measles vaccine during 1963 1967 should receive 2 doses of MMR vaccine. People who received inactivated mumps vaccine or an unknown type of mumps vaccine before 1979 and are at high risk for mumps infection should consider immunization with 2 doses of   MMR vaccine. For females of childbearing age, rubella immunity should be determined. If there is no evidence of immunity, females who are not pregnant should be vaccinated. If there is no evidence of immunity, females who are pregnant should delay immunization until after pregnancy.  Unvaccinated health care workers born before [birth year] who lack laboratory evidence of measles, mumps, or rubella immunity or laboratory confirmation of disease should consider measles and mumps immunization with 2 doses of MMR vaccine or rubella immunization with 1 dose of MMR vaccine.  Pneumococcal 13-valent conjugate (PCV13) vaccine. When indicated, a person who is uncertain of her immunization history and has no record of immunization should receive the PCV13 vaccine. An adult aged 54 years or older who has certain medical conditions and has not been previously immunized should receive 1 dose of PCV13 vaccine. This PCV13 should be followed with a dose of pneumococcal polysaccharide (PPSV23) vaccine. The PPSV23 vaccine dose should be obtained at least 8 weeks after the dose of PCV13 vaccine. An adult aged 58 years or older who has certain medical conditions and previously received 1 or more doses of PPSV23 vaccine should receive 1 dose of PCV13. The PCV13 vaccine dose should be obtained 1 or more years after the last PPSV23 vaccine dose.  Pneumococcal polysaccharide (PPSV23) vaccine. When PCV13 is also indicated, PCV13 should be obtained first. All adults aged 58 years and older should be immunized. An adult younger than age 65 years who has certain medical conditions should be immunized. Any person who resides in a nursing home or long-term care facility should be immunized. An adult smoker should be immunized. People with an immunocompromised condition and certain other conditions should receive both PCV13 and PPSV23 vaccines. People with human immunodeficiency virus (HIV) infection should be immunized as soon as possible after diagnosis. Immunization during chemotherapy or radiation therapy should be avoided. Routine use of PPSV23 vaccine is not recommended for American Indians, [place] Natives, or people younger than 65 years unless there are medical conditions that require PPSV23 vaccine. When indicated,  people who have unknown immunization and have no record of immunization should receive PPSV23 vaccine. One-time revaccination 5 years after the first dose of PPSV23 is recommended for people aged 70 64 years who have chronic kidney failure, nephrotic syndrome, asplenia, or immunocompromised conditions. People who received 1 2 doses of PPSV23 before age 32 years should receive another dose of PPSV23 vaccine at age 96 years or later if at least 5 years have passed since the previous dose. Doses of PPSV23 are not needed for people immunized with PPSV23 at or after age 55 years.  Meningococcal vaccine. Adults with asplenia or persistent complement component deficiencies should receive 2 doses of quadrivalent meningococcal conjugate (MenACWY-D) vaccine. The doses should be obtained at least 2 months apart. Microbiologists working with certain meningococcal bacteria, [employer] recruits, people at risk during an outbreak, and people who travel to or live in countries with a high rate of meningitis should be immunized. A first-year college student up through age 58 years who is living in a residence hall should receive a dose if she did not receive a dose on or after her 16th birthday. Adults who have certain high-risk conditions should receive one or more doses of vaccine.  Hepatitis A vaccine. Adults who wish to be protected from this disease, have certain high-risk conditions, work with hepatitis A-infected animals, work in hepatitis A research labs, or travel to or work in countries with a high rate of hepatitis A should be  immunized. Adults who were previously unvaccinated and who anticipate close contact with an international adoptee during the first 60 days after arrival in the [place] from a country with a high rate of hepatitis A should be immunized.  Hepatitis B vaccine.  Adults who wish to be protected from this disease, have certain high-risk conditions, may be exposed to blood or other infectious  body fluids, are household contacts or sex partners of hepatitis B positive people, are clients or workers in certain care facilities, or travel to or work in countries with a high rate of hepatitis B should be immunized.  Haemophilus influenzae type b (Hib) vaccine. A previously unvaccinated person with asplenia or sickle cell disease or having a scheduled splenectomy should receive 1 dose of Hib vaccine. Regardless of previous immunization, a recipient of a hematopoietic stem cell transplant should receive a 3-dose series 6 12 months after her successful transplant. Hib vaccine is not recommended for adults with HIV infection.  Preventive Services / Frequency Ages 6 to 39years  Blood pressure check.** / Every 1 to 2 years.  Lipid and cholesterol check.** / Every 5 years beginning at age 39.  Clinical breast exam.** / Every 3 years for women in their 61s and 62s.  BRCA-related cancer risk assessment.** / For women who have family members with a BRCA-related cancer (breast, ovarian, tubal, or peritoneal cancers).  Pap test.** / Every 2 years from ages 47 through 85. Every 3 years starting at age 34 through age 12 or 74 with a history of 3 consecutive normal Pap tests.  HPV screening.** / Every 3 years from ages 46 through ages 43 to 54 with a history of 3 consecutive normal Pap tests.  Hepatitis C blood test.** / For any individual with known risks for hepatitis C.  Skin self-exam. / Monthly.  Influenza vaccine. / Every year.  Tetanus, diphtheria, and acellular pertussis (Tdap, Td) vaccine.** / Consult your health care provider. Pregnant women should receive 1 dose of Tdap vaccine during each pregnancy. 1 dose of Td every 10 years.  Varicella vaccine.** / Consult your health care provider. Pregnant females who do not have evidence of immunity should receive the first dose after pregnancy.  HPV vaccine. / 3 doses over 6 months, if 64 and younger. The vaccine is not recommended for use in  pregnant females. However, pregnancy testing is not needed before receiving a dose.  Measles, mumps, rubella (MMR) vaccine.** / You need at least 1 dose of MMR if you were born in 1957 or later. You may also need a 2nd dose. For females of childbearing age, rubella immunity should be determined. If there is no evidence of immunity, females who are not pregnant should be vaccinated. If there is no evidence of immunity, females who are pregnant should delay immunization until after pregnancy.  Pneumococcal 13-valent conjugate (PCV13) vaccine.** / Consult your health care provider.  Pneumococcal polysaccharide (PPSV23) vaccine.** / 1 to 2 doses if you smoke cigarettes or if you have certain conditions.  Meningococcal vaccine.** / 1 dose if you are age 71 to 37 years and a Market researcher living in a residence hall, or have one of several medical conditions, you need to get vaccinated against meningococcal disease. You may also need additional booster doses.  Hepatitis A vaccine.** / Consult your health care provider.  Hepatitis B vaccine.** / Consult your health care provider.  Haemophilus influenzae type b (Hib) vaccine.** / Consult your health care provider.  Ages 55 to 64years  Blood pressure check.** / Every 1 to 2 years.  Lipid and cholesterol check.** / Every 5 years beginning at age 20 years.  Lung cancer screening. / Every year if you are aged 55 80 years and have a 30-pack-year history of smoking and currently smoke or have quit within the past 15 years. Yearly screening is stopped once you have quit smoking for at least 15 years or develop a health problem that would prevent you from having lung cancer treatment.  Clinical breast exam.** / Every year after age 40 years.  BRCA-related cancer risk assessment.** / For women who have family members with a BRCA-related cancer (breast, ovarian, tubal, or peritoneal cancers).  Mammogram.** / Every year beginning at age 40  years and continuing for as long as you are in good health. Consult with your health care provider.  Pap test.** / Every 3 years starting at age 30 years through age 65 or 70 years with a history of 3 consecutive normal Pap tests.  HPV screening.** / Every 3 years from ages 30 years through ages 65 to 70 years with a history of 3 consecutive normal Pap tests.  Fecal occult blood test (FOBT) of stool. / Every year beginning at age 50 years and continuing until age 75 years. You may not need to do this test if you get a colonoscopy every 10 years.  Flexible sigmoidoscopy or colonoscopy.** / Every 5 years for a flexible sigmoidoscopy or every 10 years for a colonoscopy beginning at age 50 years and continuing until age 75 years.  Hepatitis C blood test.** / For all people born from 1945 through 1965 and any individual with known risks for hepatitis C.  Skin self-exam. / Monthly.  Influenza vaccine. / Every year.  Tetanus, diphtheria, and acellular pertussis (Tdap/Td) vaccine.** / Consult your health care provider. Pregnant women should receive 1 dose of Tdap vaccine during each pregnancy. 1 dose of Td every 10 years.  Varicella vaccine.** / Consult your health care provider. Pregnant females who do not have evidence of immunity should receive the first dose after pregnancy.  Zoster vaccine.** / 1 dose for adults aged 60 years or older.  Measles, mumps, rubella (MMR) vaccine.** / You need at least 1 dose of MMR if you were born in 1957 or later. You may also need a 2nd dose. For females of childbearing age, rubella immunity should be determined. If there is no evidence of immunity, females who are not pregnant should be vaccinated. If there is no evidence of immunity, females who are pregnant should delay immunization until after pregnancy.  Pneumococcal 13-valent conjugate (PCV13) vaccine.** / Consult your health care provider.  Pneumococcal polysaccharide (PPSV23) vaccine.** / 1 to 2 doses if  you smoke cigarettes or if you have certain conditions.  Meningococcal vaccine.** / Consult your health care provider.  Hepatitis A vaccine.** / Consult your health care provider.  Hepatitis B vaccine.** / Consult your health care provider.  Haemophilus influenzae type b (Hib) vaccine.** / Consult your health care provider.  Ages 65 years and over  Blood pressure check.** / Every 1 to 2 years.  Lipid and cholesterol check.** / Every 5 years beginning at age 20 years.  Lung cancer screening. / Every year if you are aged 55 80 years and have a 30-pack-year history of smoking and currently smoke or have quit within the past 15 years. Yearly screening is stopped once you have quit smoking for at least 15 years or develop a health problem that   would prevent you from having lung cancer treatment.  Clinical breast exam.** / Every year after age 103 years.  BRCA-related cancer risk assessment.** / For women who have family members with a BRCA-related cancer (breast, ovarian, tubal, or peritoneal cancers).  Mammogram.** / Every year beginning at age 36 years and continuing for as long as you are in good health. Consult with your health care provider.  Pap test.** / Every 3 years starting at age 5 years through age 85 or 10 years with 3 consecutive normal Pap tests. Testing can be stopped between 65 and 70 years with 3 consecutive normal Pap tests and no abnormal Pap or HPV tests in the past 10 years.  HPV screening.** / Every 3 years from ages 93 years through ages 70 or 45 years with a history of 3 consecutive normal Pap tests. Testing can be stopped between 65 and 70 years with 3 consecutive normal Pap tests and no abnormal Pap or HPV tests in the past 10 years.  Fecal occult blood test (FOBT) of stool. / Every year beginning at age 8 years and continuing until age 45 years. You may not need to do this test if you get a colonoscopy every 10 years.  Flexible sigmoidoscopy or colonoscopy.** /  Every 5 years for a flexible sigmoidoscopy or every 10 years for a colonoscopy beginning at age 69 years and continuing until age 68 years.  Hepatitis C blood test.** / For all people born from [birth year] through 1964 and any individual with known risks for hepatitis C.  Osteoporosis screening.** / A one-time screening for women ages 7 years and over and women at risk for fractures or osteoporosis.  Skin self-exam. / Monthly.  Influenza vaccine. / Every year.  Tetanus, diphtheria, and acellular pertussis (Tdap/Td) vaccine.** / 1 dose of Td every 10 years.  Varicella vaccine.** / Consult your health care provider.  Zoster vaccine.** / 1 dose for adults aged 5 years or older.  Pneumococcal 13-valent conjugate (PCV13) vaccine.** / Consult your health care provider.  Pneumococcal polysaccharide (PPSV23) vaccine.** / 1 dose for all adults aged 74 years and older.  Meningococcal vaccine.** / Consult your health care provider.  Hepatitis A vaccine.** / Consult your health care provider.  Hepatitis B vaccine.** / Consult your health care provider.  Haemophilus influenzae type b (Hib) vaccine.** / Consult your health care provider. ** Family history and personal history of risk and conditions may change your health care provider's recommendations. Document Released: 12/10/2001 Document Revised: 08/04/2013  [facility] Patient Information 2014 [place], [place].   EXERCISE AND DIET:  We recommended that you start or continue a regular exercise program for good health. Regular exercise means any activity that makes your heart beat faster and makes you sweat.  We recommend exercising at least 30 minutes per day at least 3 days a week, preferably 5.  We also recommend a diet low in fat and sugar / carbohydrates.  Inactivity, poor dietary choices and obesity can cause diabetes, heart attack, stroke, and kidney damage, among others.     ALCOHOL AND SMOKING:  Women should limit their alcohol intake to no  more than 7 drinks/beers/glasses of wine (combined, not each!) per week. Moderation of alcohol intake to this level decreases your risk of breast cancer and liver damage.  ( And of course, no recreational drugs are part of a healthy lifestyle.)  Also, you should not be smoking at all or even being exposed to second hand smoke. Most people know smoking can  cause cancer, and various heart and lung diseases, but did you know it also contributes to weakening of your bones?  Aging of your skin?  Yellowing of your teeth and nails?   CALCIUM AND VITAMIN D:  Adequate intake of calcium and Vitamin D are recommended.  The recommendations for exact amounts of these supplements seem to change often, but generally speaking 600 mg of calcium (either carbonate or citrate) and 800 units of Vitamin D per day seems prudent. Certain women may benefit from higher intake of Vitamin D.  If you are among these women, your doctor will have told you during your visit.     PAP SMEARS:  Pap smears, to check for cervical cancer or precancers,  have traditionally been done yearly, although recent scientific advances have shown that most women can have pap smears less often.  However, every woman still should have a physical exam from her gynecologist or primary care physician every year. It will include a breast check, inspection of the vulva and vagina to check for abnormal growths or skin changes, a visual exam of the cervix, and then an exam to evaluate the size and shape of the uterus and ovaries.  And after 62 years of age, a rectal exam is indicated to check for rectal cancers. We will also provide age appropriate advice regarding health maintenance, like when you should have certain vaccines, screening for sexually transmitted diseases, bone density testing, colonoscopy, mammograms, etc.    MAMMOGRAMS:  All women over 40 years old should have a yearly mammogram. Many facilities now offer a "3D" mammogram, which may cost  around $50 extra out of pocket. If possible,  we recommend you accept the option to have the 3D mammogram performed.  It both reduces the number of women who will be called back for extra views which then turn out to be normal, and it is better than the routine mammogram at detecting truly abnormal areas.     COLONOSCOPY:  Colonoscopy to screen for colon cancer is recommended for all women at age 34.  We know, you hate the idea of the prep.  We agree, BUT, having colon cancer and not knowing it is worse!!  Colon cancer so often starts as a polyp that can be seen and removed at colonscopy, which can quite literally save your life!  And if your first colonoscopy is normal and you have no family history of colon cancer, most women don't have to have it again for 10 years.  Once every ten years, you can do something that may end up saving your life, right?  We will be happy to help you get it scheduled when you are ready.  Be sure to check your insurance coverage so you understand how much it will cost.  It may be covered as a preventative service at no cost, but you should check your particular policy.    Recommend stopping myriad of supplements, going to one FDA approved OTC Woman'd One A Day Multi-Vitamin. Schedule non-fasting lab appt in 4 weeks to re-check liver function. KEEP appt with Gastroenterology. A1c-4.8 which is low normal- eat every few hours. Keep appt with OB/GYN next month. Referral to psychology placed, someone from that practice will call you to schedule that appt. Continue to social distance and wear a mask when in public. If you feel that your memory is worsening- please schedule an office visit to address. Regular follow-up 6 months. GREAT TO SEE YOU!

## 2019-10-28 NOTE — Progress Notes (Signed)
Subjective:    Patient ID: Paula [name], female    DOB: Jan 24, 1956, 63 y.o.   MRN: [id number]  HPI:  Paula [name] is here for CPE She reports intermittent dizziness in the evening, despite drinking >90 oz water/day. She reports eating little food, often goes hrs in between meals. She reports recently being seen by the Veterinarian.that has been treating her for intestinal parasites and per pt "he told me I still have that tapeworm". Shje has initial appt with GI next week, states "I almost cancelled it". Encouraged her to keep GI appt- that she needs to be seen by this speciality she should NOT be treated by a Veterinarian. Lab Results  Component Value Date   HGBA1C 4.8 10/28/2019  She reports significant stress from her family, would like referral to BH/psychology for therapy- referral placed. She provided recent labs from [facility]. Vit D- 68 Testosterone, Serum- 56 Lipid Panel- Total- 179 TGs-59 HDL-59 LDL-109 CBC-stable CMP- Alk Phos 145- advised her to stop her myriad of supplements and use one FDA approved OTC Multivitamin   Healthcare Maintenance: PAP-Has appt with OB/GYN 11/19/2019- will complete at that time Mammogram-Scheduled 11/18/2018 Colonoscopy-Ordered Immunizations-declined influenza   Patient Care Team    Relationship Specialty Notifications Start End  Mina Marble D, NP PCP - General Family Medicine  09/28/19   Annia Belt, MD Referring Physician   09/28/19   Truitt Merle, MD Consulting Physician Hematology  09/28/19     Patient Active Problem List   Diagnosis Date Noted  . Stress due to family tension 10/28/2019  . Elevated alkaline phosphatase level 10/28/2019  . Healthcare maintenance 09/28/2019  . History of intestinal parasite 09/28/2019  . Pneumothorax, traumatic 03/20/2019  . Neutropenia with fever ([facility]) 03/25/2017  . Febrile neutropenia ([facility]) 03/25/2017  . Breast cancer of upper-outer quadrant of right female breast  ([place]) 01/08/2017  . Menopause 04/03/2014  . Basal cell carcinoma of skin 03/30/2014  . Hyperlipidemia 03/30/2014  . Neck pain 03/30/2014  . Colon polyp 03/30/2014  . Family history of breast cancer in first degree relative  MGM, mother and sister 03/30/2014  . Fibrocystic breast disease 03/30/2014  . Osteoporosis  last Dexa  02/2012 03/30/2014     Past Medical History:  Diagnosis Date  . Candida infection    in bowels  . 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 [name] infection   . History of radiation therapy 04/09/17- 05/01/17   Right Breast 42.56 Gy in 16 fractions  . Hypercholesteremia   . Hypothyroid   . Osteoporosis   . Skin cancer    right side of face basal cell     Past Surgical History:  Procedure Laterality Date  . basal cell skin cancer     to right side of face two times  . BREAST SURGERY     right breast biopsy  . ORIF CLAVICULAR FRACTURE Left 03/22/2019   Procedure: OPEN REDUCTION INTERNAL FIXATION (ORIF) CLAVICULAR FRACTURE;  Surgeon: Renette Butters, MD;  Location: [facility];  Service: Orthopedics;  Laterality: Left;  . TONSILLECTOMY     as a child     Family History  Problem Relation Age of Onset  . Cancer Mother 24       breast  . Cancer Father        pancreatic  . Cancer Sister 54       breast  . Heart disease Maternal Grandfather   . Cancer Paternal  Grandmother 22       lymphoma  . Cancer Paternal Grandfather 45       stomach  . Cancer Maternal Grandmother 95       breast cancer   . Cancer Sister 79       breast cancer      Social History   Substance and Sexual Activity  Drug Use No     Social History   Substance and Sexual Activity  Alcohol Use Yes   Comment: rare wine use     Social History   Tobacco Use  Smoking Status Never Smoker  Smokeless Tobacco Never Used  Tobacco Comment   she smoked socially as a teenager     Outpatient Encounter Medications as of 10/28/2019  Medication Sig Note  .  acetaminophen (TYLENOL) 325 MG tablet Take 1-2 tablets (325-650 mg total) by mouth every 6 (six) hours as needed for mild pain (pain score 1-3 or temp > 100.5).   Jolyne Loa Grape-Goldenseal (BERBERINE COMPLEX PO) Take by mouth.   . co-enzyme Q-10 50 MG capsule Take 200 mg by mouth daily.    . Cyanocobalamin (VITAMIN B 12 PO) Take 500 mcg by mouth daily.   Marland Kitchen DHEA 10 MG CAPS Take 1 tablet by mouth daily.   . Homeopathic Products (FRANKINCENSE UPLIFTING IN) Inhale into the lungs.   Marland Kitchen ibuprofen (ADVIL) 800 MG tablet Take 1 tablet (800 mg total) by mouth every 8 (eight) hours as needed for moderate pain.   . IODINE, KELP, PO Take by mouth. ? Dose 73m? 11/26/2017: Takes 5-10 gtts daily  . MAGNESIUM CITRATE PO Take 200 mg by mouth 2 (two) times daily.   . Melatonin 3-2 MG TABS Take 20 mg by mouth every evening. Actual dose is 3-618m   . niacin 50 MG tablet Take 50 mg by mouth at bedtime.   . Niacinamide (NICOTINAMIDE) POWD by Does not apply route.   . Marland KitchenUERCETIN PO Take 1,000 mg by mouth 3 (three) times daily. With bromelain 240 mg   . Testosterone 10 MG/ACT (2%) GEL Place 4 mg onto the skin daily.   . Marland Kitchenhiamine (VITAMIN B-1) 100 MG tablet Take 100 mg by mouth daily.   . Marland Kitchenhyroid (ARMOUR) 30 MG tablet Take 90 mg by mouth daily before breakfast.   . TURMERIC PO Take 250 mg by mouth daily. 11/26/2017: Takes 3 tabs TID  . UNABLE TO FIND Med Name: mushrooms 11/26/2017: Tu[place]ail 2 tid, & Lions mane 2 tid  . UNABLE TO FIND Take 600 mg by mouth daily. Med Name:  NAC/Nutricost   . UNABLE TO FIND Take 3 tablets by mouth daily. Med Name: [facility]   . UNABLE TO FIND Med Name: Tinctgure with black walnut hull, wormwood, quassia, cloves   . UNABLE TO FIND Med Name [place] . UNABLE TO FIND Med Name: Claga   . UNABLE TO FIND Med Name: Ashwaganda   . vitamin k 100 MCG tablet Take 1 tablet (100 mcg total) by mouth daily.   . Marland KitchenINC ASPARTATE PO Take 15 mg by mouth daily. For leaky gut 11/26/2017:  Plus copper   No facility-administered encounter medications on file as of 10/28/2019.    Allergies: Patient has no known allergies.  Body mass index is 20.1 kg/m.  Blood pressure 108/67, pulse 91, temperature 98.5 F (36.9 C), temperature source Oral, height '5\' 4"'  (1.626 m), weight 117 lb 1.6 oz (53.1 kg), SpO2 99 %.  Review of Systems  Constitutional: Positive for fatigue. Negative for activity change, appetite change, chills, diaphoresis, fever and unexpected weight change.  HENT: Negative for congestion.   Eyes: Negative for visual disturbance.  Respiratory: Negative for cough, chest tightness, shortness of breath, wheezing and stridor.   Cardiovascular: Negative for chest pain, palpitations and leg swelling.  Gastrointestinal: Positive for abdominal distention and abdominal pain. Negative for blood in stool, constipation, diarrhea, nausea and vomiting.  Endocrine: Negative for polydipsia, polyphagia and polyuria.  Genitourinary: Negative for difficulty urinating and flank pain.  Musculoskeletal: Negative for arthralgias, back pain, joint swelling, myalgias, neck pain and neck stiffness.  Neurological: Negative for dizziness and headaches.  Hematological: Negative for adenopathy. Does not bruise/bleed easily.  Psychiatric/Behavioral: Negative for agitation, behavioral problems, confusion, decreased concentration, dysphoric mood, hallucinations, self-injury, sleep disturbance and suicidal ideas. The patient is not nervous/anxious and is not hyperactive.        Objective:   Physical Exam Vitals and nursing note reviewed.  Constitutional:      General: She is not in acute distress.    Appearance: Normal appearance. She is not ill-appearing, toxic-appearing or diaphoretic.  HENT:     Head: Normocephalic and atraumatic.     Right Ear: Tympanic membrane, ear canal and external ear normal. There is no impacted cerumen.     Left Ear: Tympanic membrane, ear canal and external  ear normal. There is no impacted cerumen.     Nose: Nose normal. No congestion.     Mouth/Throat:     Mouth: Mucous membranes are moist.     Pharynx: No oropharyngeal exudate.  Eyes:     Extraocular Movements: Extraocular movements intact.     Conjunctiva/sclera: Conjunctivae normal.     Pupils: Pupils are equal, round, and reactive to light.  Cardiovascular:     Rate and Rhythm: Normal rate and regular rhythm.     Pulses: Normal pulses.     Heart sounds: Normal heart sounds. No murmur. No friction rub. No gallop.   Pulmonary:     Effort: Pulmonary effort is normal. No respiratory distress.     Breath sounds: Normal breath sounds. No stridor. No wheezing, rhonchi or rales.  Chest:     Chest wall: No tenderness.  Abdominal:     General: Abdomen is flat. Bowel sounds are normal. There is no distension.     Palpations: Abdomen is soft. There is no mass.     Tenderness: There is no abdominal tenderness. There is no right CVA tenderness, left CVA tenderness, guarding or rebound.     Hernia: No hernia is present.  Musculoskeletal:        General: No tenderness. Normal range of motion.     Cervical back: Normal range of motion and neck supple.  Skin:    General: Skin is warm.     Capillary Refill: Capillary refill takes less than 2 seconds.  Neurological:     Mental Status: She is alert and oriented to person, place, and time.     Coordination: Coordination normal.  Psychiatric:        Mood and Affect: Mood normal.        Behavior: Behavior normal.        Thought Content: Thought content normal.        Judgment: Judgment normal.       Assessment & Plan:   1. Healthcare maintenance   2. Stress due to family tension   3. Alkaline phosphatase elevation   4. History  of intestinal parasite   5. Elevated alkaline phosphatase level     Healthcare maintenance Recommend stopping myriad of supplements, going to one FDA approved OTC Woman'd One A Day Multi-Vitamin. Schedule  non-fasting lab appt in 4 weeks to re-check liver function. KEEP appt with Gastroenterology. A1c-4.8 which is low normal- eat every few hours. Keep appt with OB/GYN next month. Referral to psychology placed, someone from that practice will call you to schedule that appt. Continue to social distance and wear a mask when in public. If you feel that your memory is worsening- please schedule an office visit to address. Regular follow-up 6 months.  History of intestinal parasite May be contributing to low normal A1c KEEP f/u with GI next week!  Stress due to family tension >5 yrs Referral to BH/Psychology placed   Elevated alkaline phosphatase level Alk Phos 145- advised her to stop her myriad of supplements and use one FDA approved OTC Multivitamin  Will re-check Hepatic Fx Panel in 4 weeks Keep f/u with GI next week     FOLLOW-UP:  Return in about 4 weeks (around 11/25/2019) for Nurse Visit-Labs.

## 2019-10-28 NOTE — Assessment & Plan Note (Signed)
Recommend stopping myriad of supplements, going to one FDA approved OTC Woman'd One A Day Multi-Vitamin. Schedule non-fasting lab appt in 4 weeks to re-check liver function. KEEP appt with Gastroenterology. A1c-4.8 which is low normal- eat every few hours. Keep appt with OB/GYN next month. Referral to psychology placed, someone from that practice will call you to schedule that appt. Continue to social distance and wear a mask when in public. If you feel that your memory is worsening- please schedule an office visit to address. Regular follow-up 6 months.

## 2019-10-28 NOTE — Assessment & Plan Note (Signed)
>  5 yrs Referral to BH/Psychology placed

## 2019-11-04 ENCOUNTER — Encounter: Payer: Self-pay | Admitting: Physician Assistant

## 2019-11-04 ENCOUNTER — Ambulatory Visit: Payer: Managed Care, Other (non HMO) | Admitting: Physician Assistant

## 2019-11-04 ENCOUNTER — Other Ambulatory Visit (INDEPENDENT_AMBULATORY_CARE_PROVIDER_SITE_OTHER): Payer: Managed Care, Other (non HMO)

## 2019-11-04 ENCOUNTER — Other Ambulatory Visit: Payer: Self-pay

## 2019-11-04 VITALS — BP 100/62 | HR 88 | Temp 96.8°F | Wt 116.5 lb

## 2019-11-04 DIAGNOSIS — Z8619 Personal history of other infectious and parasitic diseases: Secondary | ICD-10-CM | POA: Diagnosis not present

## 2019-11-04 DIAGNOSIS — R7989 Other specified abnormal findings of blood chemistry: Secondary | ICD-10-CM

## 2019-11-04 DIAGNOSIS — Z860100 Personal history of colon polyps, unspecified: Secondary | ICD-10-CM | POA: Insufficient documentation

## 2019-11-04 LAB — HEPATIC FUNCTION PANEL
ALT: 26 U/L (ref 0–35)
AST: 28 U/L (ref 0–37)
Albumin: 4.7 g/dL (ref 3.5–5.2)
Alkaline Phosphatase: 117 U/L (ref 39–117)
Bilirubin, Direct: 0 mg/dL (ref 0.0–0.3)
Total Bilirubin: 0.3 mg/dL (ref 0.2–1.2)
Total Protein: 7.3 g/dL (ref 6.0–8.3)
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
## 2019-11-04 NOTE — Patient Instructions (Signed)
If you are age 63 or older, your body mass index should be between 23-30. Your Body mass index is 20 kg/m. If this is out of the aforementioned range listed, please consider follow up with your Primary Care Provider.  If you are age 53 or younger, your body mass index should be between 19-25. Your Body mass index is 20 kg/m. If this is out of the aformentioned range listed, please consider follow up with your Primary Care Provider.   Your provider has requested that you go to the basement level for lab work before leaving today. Press "B" on the elevator. The lab is located at the first door on the left as you exit the elevator.  We have requested records from [place].  We will call you with results.  Thank you for choosing me and [facility].   Amy Esterwood, PA-C

## 2019-11-08 NOTE — Progress Notes (Signed)
Please let pt know her liver tests are now normal - will wait for stool studies

## 2019-11-09 LAB — NUCLEOTIDASE, 5', BLOOD: 5-Nucleotidase: 3 U/L (ref 0–10)

## 2019-11-11 ENCOUNTER — Other Ambulatory Visit: Payer: Managed Care, Other (non HMO)

## 2019-11-12 ENCOUNTER — Other Ambulatory Visit: Payer: Managed Care, Other (non HMO)

## 2019-11-12 DIAGNOSIS — R7989 Other specified abnormal findings of blood chemistry: Secondary | ICD-10-CM

## 2019-11-12 DIAGNOSIS — Z8619 Personal history of other infectious and parasitic diseases: Secondary | ICD-10-CM

## 2019-11-15 LAB — OVA AND PARASITE EXAMINATION
CONCENTRATE RESULT:: NONE SEEN
MICRO NUMBER:: 10046989
SPECIMEN QUALITY:: ADEQUATE
TRICHROME RESULT:: NONE SEEN

## 2019-11-15 LAB — GASTROINTESTINAL PATHOGEN PANEL PCR

## 2019-11-16 ENCOUNTER — Telehealth: Payer: Self-pay

## 2019-11-16 NOTE — Telephone Encounter (Signed)
The lab called and stated that the patients stool specimen for the GI panel was rejected. The lab has called the patient and has asked her to re-submit a stool.

## 2019-11-19 ENCOUNTER — Other Ambulatory Visit: Payer: Managed Care, Other (non HMO)

## 2019-11-19 DIAGNOSIS — R7989 Other specified abnormal findings of blood chemistry: Secondary | ICD-10-CM

## 2019-11-19 DIAGNOSIS — Z8619 Personal history of other infectious and parasitic diseases: Secondary | ICD-10-CM

## 2019-11-23 ENCOUNTER — Other Ambulatory Visit: Payer: Managed Care, Other (non HMO)

## 2019-11-23 DIAGNOSIS — R7989 Other specified abnormal findings of blood chemistry: Secondary | ICD-10-CM

## 2019-11-23 DIAGNOSIS — Z8619 Personal history of other infectious and parasitic diseases: Secondary | ICD-10-CM

## 2019-11-25 LAB — GASTROINTESTINAL PATHOGEN PANEL PCR
C. difficile Tox A/B, PCR: NOT DETECTED
Campylobacter, PCR: NOT DETECTED
Cryptosporidium, PCR: NOT DETECTED
E coli (ETEC) LT/ST PCR: NOT DETECTED
E coli (STEC) stx1/stx2, PCR: NOT DETECTED
E coli 0157, PCR: NOT DETECTED
Giardia lamblia, PCR: NOT DETECTED
Norovirus, PCR: NOT DETECTED
Rotavirus A, PCR: NOT DETECTED
Salmonella, PCR: NOT DETECTED
Shigella, PCR: NOT DETECTED

## 2019-11-26 ENCOUNTER — Other Ambulatory Visit: Payer: Managed Care, Other (non HMO)

## 2019-12-01 ENCOUNTER — Telehealth: Payer: Self-pay | Admitting: Physician Assistant

## 2019-12-01 NOTE — Telephone Encounter (Signed)
Patient is calling in reference to labs

## 2019-12-01 NOTE — Telephone Encounter (Signed)
Paula [name] the pt has questions regarding stool testing.  She says that you called her yesterday to discuss further testing.  I do not see any notes mentioning this.  I advised her that you would call tomorrow.

## 2019-12-02 NOTE — Telephone Encounter (Signed)
It was in the lab notes. It looks like I was wrong on the need for a repeat test anyway and it has already been done.  Thank you for talking with her though.

## 2019-12-13 ENCOUNTER — Ambulatory Visit: Payer: Managed Care, Other (non HMO) | Admitting: Psychology

## 2020-04-20 ENCOUNTER — Ambulatory Visit
Admission: EM | Admit: 2020-04-20 | Discharge: 2020-04-20 | Disposition: A | Discharge status: Home / Self Care | Payer: 59 | Attending: Emergency Medicine | Admitting: Emergency Medicine

## 2020-04-20 ENCOUNTER — Other Ambulatory Visit: Payer: Self-pay

## 2020-04-20 ENCOUNTER — Ambulatory Visit (INDEPENDENT_AMBULATORY_CARE_PROVIDER_SITE_OTHER): Payer: 59

## 2020-04-20 DIAGNOSIS — S62646A Nondisplaced fracture of proximal phalanx of right little finger, initial encounter for closed fracture: Secondary | ICD-10-CM | POA: Diagnosis not present

## 2020-04-20 DIAGNOSIS — S63286A Dislocation of proximal interphalangeal joint of right little finger, initial encounter: Secondary | ICD-10-CM

## 2020-04-20 DIAGNOSIS — W108XXA Fall (on) (from) other stairs and steps, initial encounter: Secondary | ICD-10-CM

## 2020-04-20 DIAGNOSIS — S62616A Displaced fracture of proximal phalanx of right little finger, initial encounter for closed fracture: Secondary | ICD-10-CM | POA: Diagnosis not present

## 2020-04-20 DIAGNOSIS — S63256A Unspecified dislocation of right little finger, initial encounter: Secondary | ICD-10-CM | POA: Diagnosis not present

## 2020-04-20 DIAGNOSIS — S63259A Unspecified dislocation of unspecified finger, initial encounter: Secondary | ICD-10-CM

## 2020-04-20 NOTE — Discharge Instructions (Addendum)
Recommend RICE: rest, ice, compression, elevation as needed for pain.    Heat therapy (hot compress, warm wash rag, hot showers, etc.) can help relax muscles and soothe muscle aches. Cold therapy (ice packs) can be used to help swelling both after injury and after prolonged use of areas of chronic pain/aches.  For pain: recommend 350 mg-1000 mg of Tylenol (acetaminophen) and/or 200 mg - 800 mg of Advil (ibuprofen, Motrin) every 8 hours as needed.  May alternate between the two throughout the day as they are generally safe to take together.  DO NOT exceed more than 3000 mg of Tylenol or 3200 mg of ibuprofen in a 24 hour period as this could damage your stomach, kidneys, liver, or increase your bleeding risk.  Very important to follow up with ortho in 1 week for further management.

## 2020-04-20 NOTE — ED Provider Notes (Signed)
[name]    CSN: [id number] Arrival date & time: 04/20/20  1202      History   Chief Complaint Chief Complaint  Patient presents with  . Finger Injury    HPI Paula [name] is a 64 y.o. female.  History of hypothyroidism, EBV presenting for right pinky pain, deformity.  States she tripped going up steps an hour PTA.  Endorsing pain, decreased ROM, deformity.  Denies anticoagulant use.  No head trauma or LOC.   Past Medical History:  Diagnosis Date  . Candida infection    in bowels  . 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 [name] infection   . History of radiation therapy 04/09/17- 05/01/17   Right Breast 42.56 Gy in 16 fractions  . Hypercholesteremia   . Hypothyroid   . Osteoporosis   . Skin cancer    right side of face basal cell    Patient Active Problem List   Diagnosis Date Noted  . Hx of colonic polyps 11/04/2019  . Stress due to family tension 10/28/2019  . Elevated alkaline phosphatase level 10/28/2019  . Healthcare maintenance 09/28/2019  . History of intestinal parasite 09/28/2019  . Pneumothorax, traumatic 03/20/2019  . Neutropenia with fever ([place]) 03/25/2017  . Febrile neutropenia ([place]) 03/25/2017  . Breast cancer of upper-outer quadrant of right female breast ([place]) 01/08/2017  . Menopause 04/03/2014  . Basal cell carcinoma of skin 03/30/2014  . Hyperlipidemia 03/30/2014  . Neck pain 03/30/2014  . Colon polyp 03/30/2014  . Family history of breast cancer in first degree relative  MGM, mother and sister 03/30/2014  . Fibrocystic breast disease 03/30/2014  . Osteoporosis  last Dexa  02/2012 03/30/2014    Past Surgical History:  Procedure Laterality Date  . basal cell skin cancer     to right side of face two times  . BREAST SURGERY     right breast biopsy  . ORIF CLAVICULAR FRACTURE Left 03/22/2019   Procedure: OPEN REDUCTION INTERNAL FIXATION (ORIF) CLAVICULAR FRACTURE;  Surgeon: Renette Butters, MD;   Location: [facility];  Service: Orthopedics;  Laterality: Left;  . TONSILLECTOMY     as a child    OB History    Gravida  0   Para      Term      Preterm      AB      Living        SAB      TAB      Ectopic      Multiple      Live Births           Obstetric Comments  Pt with 2 adoptive children         Home Medications    Prior to Admission medications   Medication Sig Start Date End Date Taking? Authorizing Provider  Barberry-Oreg Grape-Goldenseal (BERBERINE COMPLEX PO) Take by mouth.    [provider]  co-enzyme Q-10 50 MG capsule Take 200 mg by mouth daily.     [provider]  Cyanocobalamin (VITAMIN B 12 PO) Take 500 mcg by mouth daily.    [provider]  DHEA 10 MG CAPS Take 1 tablet by mouth daily.    [provider]  Homeopathic Products (FRANKINCENSE UPLIFTING IN) Inhale into the lungs.    [provider]  ibuprofen (ADVIL) 800 MG tablet Take 1 tablet (800 mg total) by mouth every 8 (eight) hours as needed for  moderate pain. 03/24/19   Meuth, Brooke A, PA-C  IODINE, KELP, PO Take by mouth. ? Dose 6mg ?    [provider]  MAGNESIUM CITRATE PO Take 200 mg by mouth 2 (two) times daily.    [provider]  Melatonin 3-2 MG TABS Take 20 mg by mouth every evening. Actual dose is 3-6mg      [provider]  niacin 50 MG tablet Take 50 mg by mouth at bedtime.    [provider]  Niacinamide (NICOTINAMIDE) POWD by Does not apply route.    [provider]  QUERCETIN PO Take 1,000 mg by mouth 3 (three) times daily. With bromelain 240 mg    [provider]  Testosterone 10 MG/ACT (2%) GEL Place 4 mg onto the skin daily.    [provider]  thiamine (VITAMIN B-1) 100 MG tablet Take 100 mg by mouth daily.    [provider]  thyroid (ARMOUR) 30 MG tablet Take 90 mg by mouth daily before breakfast.    [provider]  TURMERIC PO Take 250 mg by  mouth daily.    [provider]  UNABLE TO FIND Med Name: mushrooms    [provider]  UNABLE TO FIND Take 600 mg by mouth daily. Med Name:  NAC/Nutricost    [provider]  UNABLE TO FIND Take 3 tablets by mouth daily. Med Name: Drake Leach [facility]    [provider]  UNABLE TO FIND Med Name: Tinctgure with black walnut hull, wormwood, quassia, cloves    [provider]  UNABLE TO FIND Med Name [facility]    [provider]  UNABLE TO FIND Med Name: Claga    [provider]  UNABLE TO FIND Med Name: Ashwaganda    [provider]  vitamin k 100 MCG tablet Take 1 tablet (100 mcg total) by mouth daily. 03/24/19   Meuth, Brooke A, PA-C  ZINC ASPARTATE PO Take 15 mg by mouth daily. For leaky gut    [provider]    Family History Family History  Problem Relation Age of Onset  . Cancer Mother 63       breast  . Cancer Father        pancreatic  . Cancer Sister 72       breast  . Heart disease Maternal Grandfather   . Cancer Paternal Grandmother 59       lymphoma  . Cancer Paternal Grandfather 57       stomach  . Cancer Maternal Grandmother 95       breast cancer   . Cancer Sister 69       breast cancer     Social History Social History   Tobacco Use  . Smoking status: Never Smoker  . Smokeless tobacco: Never Used  . Tobacco comment: she smoked socially as a teenager  Vaping Use  . Vaping Use: Never used  Substance Use Topics  . Alcohol use: Yes    Comment: rare wine use  . Drug use: No     Allergies   Patient has no known allergies.   Review of Systems As per HPI   Physical Exam Triage Vital Signs ED Triage Vitals  Enc Vitals Group     BP      Pulse      Resp      Temp      Temp src      SpO2  Weight      Height      Head Circumference      Peak Flow      Pain Score      Pain Loc      Pain Edu?      Excl. in [place]?    No data found.  Updated Vital Signs BP  130/68 (BP Location: Left Arm)   Pulse 79   Temp (!) 97.5 F (36.4 C) (Oral)   Resp 18   SpO2 98%   Visual Acuity Right Eye Distance:   Left Eye Distance:   Bilateral Distance:    Right Eye Near:   Left Eye Near:    Bilateral Near:     Physical Exam Constitutional:      General: She is not in acute distress. HENT:     Head: Normocephalic and atraumatic.  Eyes:     General: No scleral icterus.    Pupils: Pupils are equal, round, and reactive to light.  Cardiovascular:     Rate and Rhythm: Normal rate.  Pulmonary:     Effort: Pulmonary effort is normal.  Musculoskeletal:     Comments: Right pinky with decreased ROM second to pain and deformity.  NVI  Skin:    Coloration: Skin is not jaundiced or pale.  Neurological:     Mental Status: She is alert and oriented to person, place, and time.      UC Treatments / Results  Labs (all labs ordered are listed, but only abnormal results are displayed) Labs Reviewed - No data to display  EKG   Radiology DG Finger Little Right  Result Date: 04/20/2020 CLINICAL DATA:  Status post reduction of dislocation. EXAM: RIGHT LITTLE FINGER 2+V COMPARISON:  Same day. FINDINGS: Previously described dislocation has been successfully reduced. Mildly displaced fracture is seen involving the volar aspect of the proximal base of the fifth middle phalanx. IMPRESSION: Successful reduction of previously described dislocation. Mildly displaced fracture involving volar aspect of proximal base of fifth middle phalanx. Electronically Signed   By: Marijo Conception M.D.   On: 04/20/2020 13:59   DG Finger Little Right  Result Date: 04/20/2020 CLINICAL DATA:  Pain and deformity in the right little finger after fall. EXAM: RIGHT LITTLE FINGER 2+V COMPARISON:  None. FINDINGS: Three views of the right little finger demonstrate a dislocation at the PIP joint. There is lateral and dorsal dislocation at the fifth PIP joint. Proximal phalanx and middle phalanx  are slightly overlapping. Evidence for a small bone fragment along the volar aspect of the distal proximal phalanx. Origin of the bone fragment is not clearly identified. Marked soft tissue swelling in the right little finger. IMPRESSION: Fracture-dislocation of the right little finger at the PIP joint. Electronically Signed   By: Markus Daft M.D.   On: 04/20/2020 13:04    Procedures Orthopedic Injury Treatment  Date/Time: 04/20/2020 2:41 PM Performed by: Quincy Sheehan, PA-C Authorized by: Quincy Sheehan, PA-C   Consent:    Consent obtained:  Verbal   Consent given by:  Patient   Risks discussed:  Fracture, recurrent dislocation, nerve damage, restricted joint movement, stiffness and vascular damage   Alternatives discussed:  Immobilization and referralInjury location: finger Location details: right little finger Injury type: dislocation Dislocation type: PIP Pre-procedure neurovascular assessment: neurovascularly intact Anesthesia: digital block  Anesthesia: Local anesthesia used: yes Local Anesthetic: lidocaine 2% without epinephrine Anesthetic total: 4 mL  Patient sedated: NoManipulation performed: yes Reduction successful: yes X-ray confirmed reduction: yes Immobilization:  splint Splint type: static finger Post-procedure neurovascular assessment: post-procedure neurovascularly intact Patient tolerance: patient tolerated the procedure well with no immediate complications    (including critical care time)  Medications Ordered in UC Medications - No data to display  Initial Impression / Assessment and Plan / UC Course  I have reviewed the triage vital signs and the nursing notes.  Pertinent labs & imaging results that were available during my care of the patient were reviewed by me and considered in my medical decision making (see chart for details).     X-ray done office, reviewed by me radiology: Significant for dorsolateral dislocation of fifth PIP  joint with small bone fragment along volar aspect of distal proximal phalanx.  Consulted with Orion Crook of orthopedics who recommended manual reduction.  Digital block performed, successfully reduced PIP joint as noted on repeat radiography.  Discussed fracture, place patient in splint, and will have her follow-up with Ortho in 1 week.  Return precautions discussed, patient verbalized understanding and is agreeable to plan. Final Clinical Impressions(s) / UC Diagnoses   Final diagnoses:  Closed displaced fracture of proximal phalanx of right little finger, initial encounter  Finger dislocation, initial encounter     Discharge Instructions     Recommend RICE: rest, ice, compression, elevation as needed for pain.    Heat therapy (hot compress, warm wash rag, hot showers, etc.) can help relax muscles and soothe muscle aches. Cold therapy (ice packs) can be used to help swelling both after injury and after prolonged use of areas of chronic pain/aches.  For pain: recommend 350 mg-1000 mg of Tylenol (acetaminophen) and/or 200 mg - 800 mg of Advil (ibuprofen, Motrin) every 8 hours as needed.  May alternate between the two throughout the day as they are generally safe to take together.  DO NOT exceed more than 3000 mg of Tylenol or 3200 mg of ibuprofen in a 24 hour period as this could damage your stomach, kidneys, liver, or increase your bleeding risk.  Very important to follow up with ortho in 1 week for further management.    ED Prescriptions    None     PDMP not reviewed this encounter.   [place], [place], [place] 04/20/20 1443

## 2020-04-20 NOTE — ED Triage Notes (Signed)
Pt states tripped going up there steps and jammed her rt pinky finger an hour ago. Deformity and bruising noted.

## 2020-04-25 ENCOUNTER — Ambulatory Visit
Admission: EM | Admit: 2020-04-25 | Discharge: 2020-04-25 | Disposition: A | Discharge status: Home / Self Care | Payer: 59 | Attending: Emergency Medicine | Admitting: Emergency Medicine

## 2020-04-25 ENCOUNTER — Encounter (INDEPENDENT_AMBULATORY_CARE_PROVIDER_SITE_OTHER): Payer: Self-pay | Admitting: Adult Health

## 2020-04-25 ENCOUNTER — Other Ambulatory Visit: Payer: Self-pay

## 2020-04-25 DIAGNOSIS — W57XXXA Bitten or stung by nonvenomous insect and other nonvenomous arthropods, initial encounter: Secondary | ICD-10-CM

## 2020-04-25 DIAGNOSIS — R21 Rash and other nonspecific skin eruption: Secondary | ICD-10-CM | POA: Diagnosis not present

## 2020-04-25 NOTE — Telephone Encounter (Signed)
Please review

## 2020-04-25 NOTE — Discharge Instructions (Signed)
Keep skin clean - may use gentle soaps without perfumes/dyes. Avoid hot water (showers, baths) as this can further dry out and irritate skin. Pat skin dry as rubbing can irritate and tear skin. Apply a gentle moisturizer 1-2 times daily. 

## 2020-04-25 NOTE — ED Triage Notes (Signed)
Rash to left upper chest wall x 2 weeks

## 2020-04-25 NOTE — ED Provider Notes (Signed)
[name]    CSN: [id number] Arrival date & time: 04/25/20  1602      History   Chief Complaint Chief Complaint  Patient presents with  . Rash    HPI Paula [name] is a 64 y.o. female presenting for left upper chest rash x2 weeks.  Patient voicing concern for Lyme disease as she thinks the tick may have been crawling on her.  Patient denies tick bite, and bedding of tick, removal of tick.  No arthralgias, myalgias, headache, fatigue, chest pain, difficulty breathing, fever.  Denies pruritic rash.  Slowly has been fading over the last week.    Past Medical History:  Diagnosis Date  . Candida infection    in bowels  . 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 [name] infection   . History of radiation therapy 04/09/17- 05/01/17   Right Breast 42.56 Gy in 16 fractions  . Hypercholesteremia   . Hypothyroid   . Osteoporosis   . Skin cancer    right side of face basal cell    Patient Active Problem List   Diagnosis Date Noted  . Hx of colonic polyps 11/04/2019  . Stress due to family tension 10/28/2019  . Elevated alkaline phosphatase level 10/28/2019  . Healthcare maintenance 09/28/2019  . History of intestinal parasite 09/28/2019  . Pneumothorax, traumatic 03/20/2019  . Neutropenia with fever ([place]) 03/25/2017  . Febrile neutropenia ([place]) 03/25/2017  . Breast cancer of upper-outer quadrant of right female breast ([place]) 01/08/2017  . Menopause 04/03/2014  . Basal cell carcinoma of skin 03/30/2014  . Hyperlipidemia 03/30/2014  . Neck pain 03/30/2014  . Colon polyp 03/30/2014  . Family history of breast cancer in first degree relative  MGM, mother and sister 03/30/2014  . Fibrocystic breast disease 03/30/2014  . Osteoporosis  last Dexa  02/2012 03/30/2014    Past Surgical History:  Procedure Laterality Date  . basal cell skin cancer     to right side of face two times  . BREAST SURGERY     right breast biopsy  . ORIF CLAVICULAR  FRACTURE Left 03/22/2019   Procedure: OPEN REDUCTION INTERNAL FIXATION (ORIF) CLAVICULAR FRACTURE;  Surgeon: Renette Butters, MD;  Location: [place];  Service: Orthopedics;  Laterality: Left;  . TONSILLECTOMY     as a child    OB History    Gravida  0   Para      Term      Preterm      AB      Living        SAB      TAB      Ectopic      Multiple      Live Births           Obstetric Comments  Pt with 2 adoptive children         Home Medications    Prior to Admission medications   Medication Sig Start Date End Date Taking? Authorizing Provider  Barberry-Oreg Grape-Goldenseal (BERBERINE COMPLEX PO) Take by mouth.    [provider]  co-enzyme Q-10 50 MG capsule Take 200 mg by mouth daily.     [provider]  Cyanocobalamin (VITAMIN B 12 PO) Take 500 mcg by mouth daily.    [provider]  DHEA 10 MG CAPS Take 1 tablet by mouth daily.    [provider]  Homeopathic Products (FRANKINCENSE UPLIFTING IN) Inhale into the lungs.  [provider]  ibuprofen (ADVIL) 800 MG tablet Take 1 tablet (800 mg total) by mouth every 8 (eight) hours as needed for moderate pain. 03/24/19   Meuth, Brooke A, PA-C  IODINE, KELP, PO Take by mouth. ? Dose 6mg ?    [provider]  MAGNESIUM CITRATE PO Take 200 mg by mouth 2 (two) times daily.    [provider]  Melatonin 3-2 MG TABS Take 20 mg by mouth every evening. Actual dose is 3-6mg      [provider]  niacin 50 MG tablet Take 50 mg by mouth at bedtime.    [provider]  Niacinamide (NICOTINAMIDE) POWD by Does not apply route.    [provider]  QUERCETIN PO Take 1,000 mg by mouth 3 (three) times daily. With bromelain 240 mg    [provider]  Testosterone 10 MG/ACT (2%) GEL Place 4 mg onto the skin daily.    [provider]  thiamine (VITAMIN B-1) 100 MG tablet Take 100 mg by mouth daily.    [provider]    thyroid (ARMOUR) 30 MG tablet Take 90 mg by mouth daily before breakfast.    [provider]  TURMERIC PO Take 250 mg by mouth daily.    [provider]  UNABLE TO FIND Med Name: mushrooms    [provider]  UNABLE TO FIND Take 600 mg by mouth daily. Med Name:  NAC/Nutricost    [provider]  UNABLE TO FIND Take 3 tablets by mouth daily. Med Name: Drake Leach [facility]    [provider]  UNABLE TO FIND Med Name: Tinctgure with black walnut hull, wormwood, quassia, cloves    [provider]  UNABLE TO FIND Med Name [place]    [provider]  UNABLE TO FIND Med Name: Claga    [provider]  UNABLE TO FIND Med Name: Ashwaganda    [provider]  vitamin k 100 MCG tablet Take 1 tablet (100 mcg total) by mouth daily. 03/24/19   Meuth, Brooke A, PA-C  ZINC ASPARTATE PO Take 15 mg by mouth daily. For leaky gut    [provider]    Family History Family History  Problem Relation Age of Onset  . Cancer Mother 22       breast  . Cancer Father        pancreatic  . Cancer Sister 82       breast  . Heart disease Maternal Grandfather   . Cancer Paternal Grandmother 63       lymphoma  . Cancer Paternal Grandfather 48       stomach  . Cancer Maternal Grandmother 95       breast cancer   . Cancer Sister 22       breast cancer     Social History Social History   Tobacco Use  . Smoking status: Never Smoker  . Smokeless tobacco: Never Used  . Tobacco comment: she smoked socially as a teenager  Vaping Use  . Vaping Use: Never used  Substance Use Topics  . Alcohol use: Yes    Comment: rare wine use  . Drug use: No     Allergies   Patient has no known allergies.   Review of Systems As per HPI   Physical Exam Triage Vital Signs ED Triage Vitals  Enc Vitals Group     BP      Pulse  Resp      Temp      Temp src      SpO2      Weight      Height      Head Circumference       Peak Flow      Pain Score      Pain Loc      Pain Edu?      Excl. in [place]?    No data found.  Updated Vital Signs BP 128/71   Pulse 79   Temp 98 F (36.7 C)   Resp 16   SpO2 95%   Visual Acuity Right Eye Distance:   Left Eye Distance:   Bilateral Distance:    Right Eye Near:   Left Eye Near:    Bilateral Near:     Physical Exam Constitutional:      General: She is not in acute distress. HENT:     Head: Normocephalic and atraumatic.  Eyes:     General: No scleral icterus.    Pupils: Pupils are equal, round, and reactive to light.  Cardiovascular:     Rate and Rhythm: Normal rate.  Pulmonary:     Effort: Pulmonary effort is normal.  Skin:    Coloration: Skin is not jaundiced or pale.     Findings: Erythema present.     Comments: Slight erythema to left chest.  Nontender, no scaling, no bull's-eye.  Neurological:     Mental Status: She is alert and oriented to person, place, and time.      UC Treatments / Results  Labs (all labs ordered are listed, but only abnormal results are displayed) [place] MTN SPOTTED FVR ABS PNL(IGG+IGM)    EKG   Radiology No results found.  Procedures Procedures (including critical care time)  Medications Ordered in UC Medications - No data to display  Initial Impression / Assessment and Plan / UC Course  I have reviewed the triage vital signs and the nursing notes.  Pertinent labs & imaging results that were available during my care of the patient were reviewed by me and considered in my medical decision making (see chart for details).     Patient febrile, nontoxic, and without systemic symptoms concerning for Lyme at this time as mentioned his HPI.  No appreciable erythema migrans: We will obtain basic labs, treat if indicated.  Does not meet criteria for prophylactic lyme treatment.  Return precautions discussed, patient verbalized understanding and is agreeable to plan. Final  Clinical Impressions(s) / UC Diagnoses   Final diagnoses:  Tick bite, initial encounter  Rash and nonspecific skin eruption     Discharge Instructions     Keep skin clean - may use gentle soaps without perfumes/dyes. Avoid hot water (showers, baths) as this can further dry out and irritate skin. Pat skin dry as rubbing can irritate and tear skin. Apply a gentle moisturizer 1-2 times daily.    ED Prescriptions    None     PDMP not reviewed this encounter.   Hall[place], [place], [place] 04/25/20 1818

## 2020-04-27 LAB — ROCKY MTN SPOTTED FVR ABS PNL(IGG+IGM)
RMSF IgG: NEGATIVE
RMSF IgM: 0.74 index (ref 0.00–0.89)

## 2020-04-27 LAB — B. BURGDORFI ANTIBODIES: Lyme IgG/IgM Ab: <0.91 {ISR} (ref 0.00–0.90)

## 2021-08-14 ENCOUNTER — Ambulatory Visit: Payer: 59 | Admitting: Dermatology

## 2021-12-12 ENCOUNTER — Other Ambulatory Visit: Payer: Self-pay

## 2021-12-13 ENCOUNTER — Telehealth: Payer: Self-pay | Admitting: Physician Assistant

## 2021-12-13 ENCOUNTER — Telehealth: Payer: Self-pay | Admitting: Hematology

## 2021-12-13 ENCOUNTER — Telehealth: Payer: Self-pay

## 2021-12-13 NOTE — Telephone Encounter (Signed)
Spoke with pt via telephone regarding bringing in her recent diagnostic test, mammograms, medical records, and all her current medications (prescribed and holistic medications).  Pt stated she would bring in the requested records but stated she does not have a recent mammogram d/t when she tried scheduling it the clinic was not sure what type of mammogram she needed (regular or diagnostic mammogram).  Pt is scheduled to see Cassie Heilingoetter, PA and Dr. Burr Medico on 12/14/2021.

## 2021-12-13 NOTE — Telephone Encounter (Signed)
Scheduled appt per 2/15 staff msg from Dr. Burr Medico. Called pt, no answer. Left msg with appt date and time. Requested for pt to call back to confirm appt. Will try reaching out to the pt again this afternoon if I do not hear back from her.

## 2021-12-13 NOTE — Progress Notes (Signed)
[place] New Consult Note   Greig Right, MD [place] [id number]  CHIEF COMPLAIN: concern for cancer recurrence   Oncology History Overview Note  Cancer Staging Malignant neoplasm of upper outer quadrant of female breast ([place]) Staging form: Breast, AJCC 8th Edition - Clinical: No stage assigned - Unsigned - Pathologic: pT1b, pN0, cM0, ER: Negative, PR: Negative, HER2: Negative - Signed by Eppie Gibson, MD on 01/08/2017     Basal cell carcinoma of skin  03/30/2014 Initial Diagnosis   Basal cell carcinoma of skin   Breast cancer of upper-outer quadrant of right female breast ([place])  02/01/2016 Mammogram   Breast density category B. There are new punctate calcification at the 11:00 position of right breast, highly suspicious for malignancy. Patient refused biopsy   12/05/2016 Initial Diagnosis   Malignant neoplasm of upper outer quadrant of female breast ([place])   12/05/2016 Surgery   Right breast lumpectomy and sentinel lymph node biopsy by Dr. Ernst Bowler at [facility]    12/05/2016 Pathology Results   Right breast lumpectomy and a sentinel lymph node biopsy showed invasive ductal carcinoma, multifocal, tumor size 9 mm, 7 mm and 3 mm. Grade 3. High-grade DCIS with microcalcifications. Final margins were negative for invasive and DCIS. 2 sentinel lymph nodes were negative.   12/05/2016 Receptors her2   ER, PR and HER-2 triple negative   01/22/2017 Imaging   Bone Density  ASSESSMENT: The BMD measured at AP Spine L1-L4 is 0.766 g/cm2 with a T-score of -3.5. This patient is considered OSTEOPOROTIC according to [facility]) criteria. There has been a statistically significant decrease in BMD of left hip since exam dated 03/12/2012. Spine was not compared to prior study due to exclusion of vertebral bodies.   01/29/2017 - 03/12/2017 Adjuvant Chemotherapy   Docetaxel and Cytoxan every 3 weeks starting 01/29/17. Neulasta given 2 days after  chemo. She received 3 cycles. Last cycle was canceled by patient due to the concerns of long-term side effects.   03/25/2017 - 03/27/2017 Hospital Admission   Admit date: 03/25/17 Admission diagnosis: Neutropenic Fever  Additional comments: resolved with granix   04/09/2017 - 05/01/2017 Radiation Therapy        CURRENT THERAPY: None  HISTORY OF PRESENT ILLNESS: Paula [name] 66 y.o. female returns to the clinic for a follow-up visit after being lost to follow-up since 2019.  (See history of present illness below).  The patient was last seen by Dr. Burr Medico on 11/26/2017.  At that time, the patient completed 3 cycles of chemotherapy.  She declined undergoing her last cycle of chemotherapy due to concerns about long-term adverse side effects from treatment such as a small risk of leukemia and MDS.  The patient opted to not pursue the last cycle of treatment.  Dr. Burr Medico recommended 53-monthfollow-up visit.  The patient was lost to follow-up at that time.  She was supposed to follow-up in 6 months with the patient canceled her appointment due to lack of insurance per a note.  The patient has been getting her care through her surgeon, OBGYN, PCP, and [facility] However, she is overdue for her mammogram. She has been having a hard time getting her mammogram ordered. Her last mammogram was in January 2021, which was benign at that time. She denies any new breast masses, nipple inversion/discharge, or axillary adenopathy. She saw her OBGYN about 1 month ago. She reports her breast exam was negative at that time.  While seeing her integrative medicine provider recently, a CMP was drawn.  Her CMP showed mildly elevated alkaline phosphatase at 132.  The alkaline phosphatase isoenzymes were performed which showed the elevated isoenzyme was bone as opposed to liver.  Her provider told her this could be a sign of metastatic disease to the bone which prompted her follow-up visit with  our office today.  Her last CMP from September 2022 showed normal alkaline phosphatase at 102.  The patient denies any recent bone traumas or injuries.   Since last being seen in 2019, she reports she has been feeling pretty good. She sometimes has a RLQ pain which has been going on for about 20 years or so. It started when she previously was diagnosed with parasites, which has been treated. She reports the discomfort has moved to other parts of the abdomen. She thinks she has been having this pain a little more frequently lately, however, she is on numerous herbal supplements and is concerned one of them may be causing her discomfort. Denies changes in bowel habits, nausea/vomiting, jaundice, blood in the stool, or dark urine. The pain occurs ~1x per week lasting the day. She also notes some mild low back pain if leaning up against a seat. No pain to palpation or movement.   She denies fevers, chills, night sweats, or unexplained weight loss. She has been eating a plant based diet. She sometimes has a right flank pain but if she rubs the area, the pain goes away. She has osteoporosis but previously declined zometa or fosamax. She takes calcium and vitamin D. She also does weight bearing exercises and is very active with her physical health. She also drinks plenty of water.   She denies chest pain, shortness of breath, hemoptysis, or cough. She is not taking any hormones except a topical testosterone cream.    HISTORY OF PRESENTING ILLNESS:  Paula [name] 66 y.o. female is here because of diagnosed right multifocal ductal carcinoma, triple negative.  She is s/p right lumpectomy and node biopsy on 12/05/2016. She was referred by her radiation oncologist Dr. Isidore Moos. She presents to my clinic by herself today.   The patient had a routine mammogram on 01/2016. The findings demonstrated a group of indeterminate microcalcification over the upper outer right breast for which  A biopsy was recommended, which was  declined at the time. The patient opted to pursue a thermogram instead that was reported as normal. A few months after, while walking her dog, and felt pain in her arm. A repeat thermogram was done and showed an abnormality on the opposite side. She then decided to repeat a mammogram.    Repeat mammogram on 10/25/16 showed an irregular 7 mm mass over the posterior third of the upper right breast with few associated microcalcifications, as this mass lies along the posterior edge of the previously described segmental microclacifcations. Targeted ultrasound showed bilobed hypoechoic mass with mild border irregularly over the 10:30 position of the right breast 9 cm from the nipple corresponding to the mass with the border irregularity.   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   Right lumpectomy and sentinel node dissection on 12/05/16 with Dr. Ernst Bowler revealed multifocal breast cancer - pT1b, pN0.   She reports her sisters have experienced the same thing, which has resulted in GCIS, Stage 0.   Due to her history of triple negative breast cancer, the  patient underwent 3 cycles of docetaxel and Cytoxan.  The patient was admitted to the hospital for neutropenic fever after chemotherapy with cycle #3.  The patient was concerned about her long-term side effects from chemotherapy ,  especially the small risk of leukemia and MDS.  P Dr. Burr Medico previously tried to encourage the patient to complete the last cycle of chemotherapy but after further discussion the patient declined to pursue the last cycle of treatment.  The patient has been lost to follow-up since 11/26/2017.     Patient is strongly believe homeopathic and alternative medicine, she takes multiple vitamin and herbal supplements.  MEDICAL HISTORY: Past Medical History:  Diagnosis Date   Candida infection     in bowels   Digestive system disease    tape worms and giardia   [name] infection    History of radiation therapy 04/09/17- 05/01/17   Right Breast 42.56 Gy in 16 fractions   Hypercholesteremia    Hypothyroid    Osteoporosis    Skin cancer    right side of face basal cell    ALLERGIES:  has No Known Allergies.  MEDICATIONS:  Current Outpatient Medications  Medication Sig Dispense Refill   Ascorbic Acid (VITAMIN C) POWD Take 1.1 g by mouth. "Several times a day"     Boswellia Serrata (BOSWELLIA PO) Take 600 mg by mouth daily.     co-enzyme Q-10 50 MG capsule Take 200 mg by mouth daily.      Copper Gluconate (COPPER CAPS) 2 MG CAPS Take 2 mg by mouth daily.     Cyanocobalamin (VITAMIN B 12 PO) Take 50 mcg by mouth daily.     DHEA 10 MG CAPS Take 1 tablet by mouth daily.     Homeopathic Products ([place]) Inhale into the lungs.     ibuprofen (ADVIL) 800 MG tablet Take 1 tablet (800 mg total) by mouth every 8 (eight) hours as needed for moderate pain. 30 tablet 0   IODINE, KELP, PO Take by mouth. ? Dose 24m?     Levomefolate Glucosamine (METHYLFOLATE PO) Take 1,700 mcg by mouth daily.     MAGNESIUM GLYCINATE PO Take 420 mg by mouth daily.     Melatonin 3-2 MG TABS Take 20 mg by mouth every evening. Actual dose is 3-691m     MILK THISTLE PO Take 295 mg by mouth daily.     niacin 50 MG tablet Take 50 mg by mouth at bedtime.     Niacinamide (NICOTINAMIDE) POWD Take 500 mg by mouth daily.     Pregnenolone Micronized (PREGNENOLONE PO) Take 100 mg by mouth daily.     Probiotic Product (PROBIOTIC BLEND PO) Take by mouth daily.     QUERCETIN PO Take 29 mg by mouth 3 (three) times daily. With bromelain 240 mg     Testosterone 10 MG/ACT (2%) GEL Place 45 mg onto the skin daily.     thiamine (VITAMIN B-1) 100 MG tablet Take 100 mg by mouth daily.     thyroid (ARMOUR) 30 MG tablet Take 180 mg by mouth daily.     TURMERIC PO Take 250 mg by mouth daily.     UNABLE TO FIND  Med Name: mushrooms     UNABLE TO FIND Take 600 mg by mouth in the morning, at noon, and at bedtime. Med Name:  NAC/Nutricost     UNABLE TO FIND Take 3 tablets by mouth daily. Med Name: ThDrake LeachFMed[facility]  UNABLE TO FIND Med Name: Tinctgure with black walnut hull, wormwood, quassia, cloves     UNABLE TO FIND Med Name [place] Name: [place] Name: Ashwaganda     UNABLE TO FIND Med Name: Oreg Grape 447m QD     UNABLE TO FIND Take 1,325 mg by mouth daily. Med Name: Calcifood ([employer] brand)     UNABLE TO FIND Take 3,000 mg by mouth daily. Med Name: Lauricidin     UNABLE TO FIND Med Name: Pure Encapsulate     UNABLE TO FIND Take 222 mg by mouth daily. Med Name: Vars Lipomosal Glutathione     UNABLE TO FIND Take 400 mg by mouth daily. Med Name: ONew YorkGrape     UNABLE TO FIND Take 430 mg by mouth daily. Med Name: Burdock Root     vitamin k 100 MCG tablet Take 1 tablet (100 mcg total) by mouth daily.     ZINC ASPARTATE PO Take 22.5 mg by mouth daily. For leaky gut     No current facility-administered medications for this visit.    SURGICAL HISTORY:  Past Surgical History:  Procedure Laterality Date   basal cell skin cancer     to right side of face two times   BREAST SURGERY     right breast biopsy   ORIF CLAVICULAR FRACTURE Left 03/22/2019   Procedure: OPEN REDUCTION INTERNAL FIXATION (ORIF) CLAVICULAR FRACTURE;  Surgeon: MRenette Butters MD;  [place] [facility]  Service: Orthopedics;  Laterality: Left;   TONSILLECTOMY     as a child    REVIEW OF SYSTEMS:   Review of Systems  Constitutional: Negative for appetite change, chills, fatigue, fever and unexpected weight change.  HENT: Negative for mouth sores, nosebleeds, sore throat and trouble swallowing.   Eyes: Negative for eye problems and icterus.  Respiratory: Negative for cough, hemoptysis, shortness of breath and wheezing.   Cardiovascular: Negative for chest pain and leg  swelling.  Gastrointestinal: Positive for chronic and intermittent RLQ pain. Negative for constipation, diarrhea, nausea and vomiting.  Genitourinary: Negative for bladder incontinence, difficulty urinating, dysuria, frequency and hematuria.   Musculoskeletal: Positive for mild low back pain. Negative for  gait problem, neck pain and neck stiffness.  Skin: Negative for itching and rash.  Neurological: Negative for dizziness, extremity weakness, gait problem, headaches, light-headedness and seizures.  Hematological: Negative for adenopathy. Does not bruise/bleed easily.  Psychiatric/Behavioral: Positive for insomnia. Negative for confusion, depression. The patient is not nervous/anxious.     PHYSICAL EXAMINATION:  Blood pressure (!) 141/80, pulse 88, temperature (!) 96.5 F (35.8 C), temperature source Tympanic, resp. rate 17, height '5\' 4"'  (1.626 m), weight 109 lb 11.2 oz (49.8 kg), SpO2 100 %.  ECOG PERFORMANCE STATUS: 0  Physical Exam  Constitutional: Oriented to person, place, and time and well-developed, well-nourished, and in no distress. HENT:  Head: Normocephalic and atraumatic.  Mouth/Throat: Oropharynx is clear and moist. No oropharyngeal exudate.  Eyes: Conjunctivae are normal. Right eye exhibits no discharge. Left eye exhibits no discharge. No scleral icterus.  Neck: Normal range of motion. Neck supple.  Cardiovascular: Normal rate, regular rhythm, normal heart sounds and intact distal pulses.   Pulmonary/Chest: Effort normal and breath sounds normal. No respiratory distress. No wheezes. No rales.  Abdominal: Soft. Bowel sounds are normal. Exhibits no distension and no mass. There is no tenderness.  Musculoskeletal: Normal range of motion. Exhibits no edema.  Lymphadenopathy:    No cervical adenopathy.  Neurological: Alert and oriented to person, place, and time. Exhibits normal muscle tone. Gait normal. Coordination normal.  Skin: Skin is warm and dry. No rash noted. Not  diaphoretic. No erythema. No pallor.  Breasts: Breast inspection showed them to be symmetrical with no nipple discharge. Palpation of the breasts and axilla revealed no obvious mass that I could appreciate.  Psychiatric: Mood, memory and judgment normal.  Vitals reviewed.  LABORATORY DATA: Lab Results  Component Value Date   WBC 4.9 09/22/2019   HGB 12.9 09/22/2019   HCT 39 09/22/2019   MCV 95.0 03/21/2019   PLT 269 09/22/2019      Chemistry      Component Value Date/Time   NA 140 09/22/2019 0000   NA 135 (L) 05/30/2017 1303   K 4.0 09/22/2019 0000   K 4.1 05/30/2017 1303   CL 104 09/22/2019 0000   CO2 23 (A) 09/22/2019 0000   CO2 28 05/30/2017 1303   BUN 8 09/22/2019 0000   BUN 14.0 05/30/2017 1303   CREATININE 0.6 09/22/2019 0000   CREATININE 0.42 (L) 03/21/2019 0230   CREATININE 0.7 05/30/2017 1303   GLU 78 09/22/2019 0000      Component Value Date/Time   CALCIUM 8.5 (L) 03/21/2019 0230   CALCIUM 9.8 05/30/2017 1303   ALKPHOS 117 11/04/2019 1633   ALKPHOS 100 05/30/2017 1303   AST 28 11/04/2019 1633   AST 24 05/30/2017 1303   ALT 26 11/04/2019 1633   ALT 20 05/30/2017 1303   BILITOT 0.3 11/04/2019 1633   BILITOT 0.24 05/30/2017 1303       RADIOGRAPHIC STUDIES:  No results found.   ASSESSMENT/PLAN:  66 y.o. Caucasian female, postmenopausal   1. 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  -She had early stage breast cancer, had a complete surgical resection in 2018. -she was reluctant to take adjuvant chemo, but eventually started TC, plan for 4 cycles.  She had declined Neulasta after this cycle chemotherapy, she was admitted for neutropenic fever after chemo, ID workup was negative. Her neutropenic resolved with 1 dose of Granix.   -She was previously very concerned about long-term side effects from chemotherapy, especially the small risk of leukemia and  MDS. Dr. Burr Medico tried to encourage her to take the last cycle chemotherapy, and explained to her that the risk is small. After lengthy discussion, patient decided not to pursue the last cycle chemotherapy. -Patient takes multiple vitamins and herbs supplements. -She has completed adjuvant radiation therapy on 05/01/2017  -The patient had been lost to follow up since 2019.  -The patient's most recent mammogram was from January 2021, which was benign.  -The patient had a CMP performed by her integrative medicine provider, Dr. Cassie Freer at [facility]. Her Alk phos was slightly elevated at 132. Her isoenzymes showed it was bone alk phos. The rest of her CMP was unremarkable. She is following up today because she is concerned she may have metastatic disease.  -The patient was seen with Dr. Burr Medico. Dr. Burr Medico reassured the patient that the elevations in her alk phos are mild. We will repeat her labs today. Given that she is 5 years from her diagnosis in 2017, Dr. Burr Medico discussed the risk of recurrence is small.  -If her CMP shows minimally elevated alk phos, Dr. Burr Medico discussed we can arrange a PET scan or a bone scan to  rule out metastatic disease. Dr. Burr Medico offered the PET scan as that would also include the abdomen due to the patient's worsening of her chronic abdominal discomfort. Since the risk of recurrence is small and reassuring labs, the patient would like to first see if she has improvement in her Alk phos before pursuing imaging that may not be necessary.  -She will have CBC, CMP, and CA 27.29 drawn today. She had a normal breast exam. I will call her with the results. If normal, Dr. Burr Medico discussed since the patient is 5 years out from diagnosis, then she can follow with her PCP and Gyn. Of course, if the patient is more comfortable to follow with out clinic, we can arrange follow up.  -If her CMP shows elevated alk phos, I will call her and we can arrange PET or bone scan. I would likely  order PET scan to also assess for other sites of involvement. If not covered by insurance, will arrange a bone scan.  -The patient would like to follow with her PCP and GYN. She was given our number should she ever develop new concerning complaints.  -She is overdue for her mammogram. I have ordered a screening mammogram. She declined going to GI breast center. She has been having a challenging time having this scan ordered by her other providers. She was agreeable to have this performed through [facility]. She opted to have this at [facility] due to proximity of where she lives.  -Her CMP showed normal alk phos but mild elevated LFTs (AST 73 and ALT 52). This could be due to her numerous supplements. She was advised to hold alcohol and her supplements for 1 month until her labs can be repeated. At that time once repeated, we can discuss follow up instructions for imaging if continued worsening of her LFTS.     2. Genetic Testing - Mother and two sisters with breast cancer, all tested negative. Sister with VUS in BRCA2. 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 - Father had pancreatic cancer .   3. Osteoporosis of spine  -Bone scan on 01/22/17 revealed worsening osteoporosis, with T score of -3.5, she is at high risk for fracture. -Dr. Burr Medico previously advised the patient to continue calcium and Vitamin D supplementation. -Dr. Burr Medico previously recommended her to consider biphosphonate, such as oral Fosamax or iv zometa, to improve her bone density. -she will follow up with PCP -She will continue to take Vitamin D and calcium supplements    4. Chronic Abdominal Discomfort -Patient reports chronic intermittent RLQ pain. She states she eventually was diagnosed with parasites and candida.  -She continues to have RLQ pain intermittently, none today. She estimates this occurs once a week.  -She is concerned that it could be  one of her multiple herbal supplements.  -Dr. Burr Medico encouraged her to stop her herbal supplements and see if her pain improves. Dr. Burr Medico also discussed this could be the etiology of her mildly elevated alk phos and LFTs. If concerned, she can stop her supplements for 1 month and have repeat labs drawn.  -I will arrange for this.    Plan:   -Mammogram ordered -CMP, CBC, and CA 27.29 performed today -Mild elevated AST and ALT, advised to stop alcohol and supplements. Will recheck in 1 month.  -Otherwise, since 5 years out from diagnosis and low risk of recurrence at this point, given option to follow with PCP vs our clinic. We  will decide if any imaging and follow up is needed based on her CMP recheck in 1 month.   Orders Placed This Encounter  Procedures   MM Digital Screening    Standing Status:   Future    Standing Expiration Date:   12/14/2022    Order Specific Question:   Reason for Exam (SYMPTOM  OR DIAGNOSIS REQUIRED)    Answer:   Hx breast cancer, triple negative. Last mammo 1/21. Overdue for mammogram    Order Specific Question:   Preferred imaging location?    Answer:   [facility]   [place] 27.29    Standing Status:   Future    Number of Occurrences:   1    Standing Expiration Date:   12/14/2022   CBC with Differential ([place] Only)    Standing Status:   Future    Number of Occurrences:   1    Standing Expiration Date:   12/14/2022   CMP ([place] only)    Standing Status:   Future    Number of Occurrences:   1    Standing Expiration Date:   12/14/2022     I spent 30 minutes counseling the patient face to face. The total time spent in the appointment was 40 minutes.  Leahmarie Gasiorowski L Ramy Greth, PA-C 12/14/21  Addendum  I have seen the patient, examined her. I agree with the assessment and and plan and have edited the notes.   Anaih was last seen by me in 10/2017 and lost f/u after that. She had stage I triple negative right breast cancer in 2017, postsurgery,  adjuvant chemotherapy (only received 3 out of 4 planned cycles) and radiation.  She returns today due to the concern of abnormal which was obtained by her functional doctor.  Outside labs showed slightly elevated alkaline phosphatase, but otherwise CMP was unremarkable.  She is clinically doing well, does not have chronic right lower quadrant abdominal pain, no other new symptoms.  We discussed the risk of recurrence from her previous triple negative breast cancer is very low now since it has been 5 years, we will repeat her lab CBC and a CMP, tumor marker CA 27.29 today, if unremarkable, I do not think she needs imaging study to rule out metastatic disease.  If it is still slightly elevated, I will recommend her to stop all supplements, and a repeat lab in 1 months.  All questions were answered, she agrees with the plan.  I will see her as needed.  Truitt Merle  12/14/2021

## 2021-12-13 NOTE — Telephone Encounter (Signed)
Pt called me back and confirmed appt for tomorrow at 11am. She is aware to be here 15 mins prior to appt time.

## 2021-12-14 ENCOUNTER — Encounter: Payer: Self-pay | Admitting: Physician Assistant

## 2021-12-14 ENCOUNTER — Inpatient Hospital Stay: Payer: 59 | Attending: Physician Assistant | Admitting: Physician Assistant

## 2021-12-14 ENCOUNTER — Other Ambulatory Visit: Payer: Self-pay

## 2021-12-14 ENCOUNTER — Inpatient Hospital Stay: Payer: 59

## 2021-12-14 VITALS — BP 141/80 | HR 88 | Temp 96.5°F | Resp 17 | Ht 64.0 in | Wt 109.7 lb

## 2021-12-14 DIAGNOSIS — Z923 Personal history of irradiation: Secondary | ICD-10-CM | POA: Diagnosis not present

## 2021-12-14 DIAGNOSIS — Z800 Family history of malignant neoplasm of digestive organs: Secondary | ICD-10-CM | POA: Diagnosis not present

## 2021-12-14 DIAGNOSIS — Z79899 Other long term (current) drug therapy: Secondary | ICD-10-CM | POA: Insufficient documentation

## 2021-12-14 DIAGNOSIS — Z9221 Personal history of antineoplastic chemotherapy: Secondary | ICD-10-CM | POA: Diagnosis not present

## 2021-12-14 DIAGNOSIS — R748 Abnormal levels of other serum enzymes: Secondary | ICD-10-CM | POA: Diagnosis not present

## 2021-12-14 DIAGNOSIS — C50411 Malignant neoplasm of upper-outer quadrant of right female breast: Secondary | ICD-10-CM

## 2021-12-14 DIAGNOSIS — Z803 Family history of malignant neoplasm of breast: Secondary | ICD-10-CM | POA: Insufficient documentation

## 2021-12-14 DIAGNOSIS — Z853 Personal history of malignant neoplasm of breast: Secondary | ICD-10-CM | POA: Insufficient documentation

## 2021-12-14 DIAGNOSIS — E039 Hypothyroidism, unspecified: Secondary | ICD-10-CM | POA: Insufficient documentation

## 2021-12-14 DIAGNOSIS — Z171 Estrogen receptor negative status [ER-]: Secondary | ICD-10-CM

## 2021-12-14 DIAGNOSIS — R7989 Other specified abnormal findings of blood chemistry: Secondary | ICD-10-CM

## 2021-12-14 LAB — CMP (CANCER CENTER ONLY)
ALT: 52 U/L — ABNORMAL HIGH (ref 0–44)
AST: 73 U/L — ABNORMAL HIGH (ref 15–41)
Albumin: 4.7 g/dL (ref 3.5–5.0)
Alkaline Phosphatase: 106 U/L (ref 38–126)
Anion gap: 4 — ABNORMAL LOW (ref 5–15)
BUN: 9 mg/dL (ref 8–23)
CO2: 30 mmol/L (ref 22–32)
Calcium: 10.1 mg/dL (ref 8.9–10.3)
Chloride: 98 mmol/L (ref 98–111)
Creatinine: 0.5 mg/dL (ref 0.44–1.00)
GFR, Estimated: >60 mL/min (ref >60)
Glucose, Bld: 101 mg/dL — ABNORMAL HIGH (ref 70–99)
Potassium: 4.5 mmol/L (ref 3.5–5.1)
Sodium: 132 mmol/L — ABNORMAL LOW (ref 135–145)
Total Bilirubin: 0.4 mg/dL (ref 0.3–1.2)
Total Protein: 7.2 g/dL (ref 6.5–8.1)

## 2021-12-14 LAB — CBC WITH DIFFERENTIAL (CANCER CENTER ONLY)
Abs Immature Granulocytes: 0.02 10*3/uL (ref 0.00–0.07)
Basophils Absolute: 0 10*3/uL (ref 0.0–0.1)
Basophils Relative: 0 %
Eosinophils Absolute: 0 10*3/uL (ref 0.0–0.5)
Eosinophils Relative: 0 %
HCT: 40.6 % (ref 36.0–46.0)
Hemoglobin: 13.7 g/dL (ref 12.0–15.0)
Immature Granulocytes: 0 %
Lymphocytes Relative: 16 %
Lymphs Abs: 1.3 10*3/uL (ref 0.7–4.0)
MCH: 30.9 pg (ref 26.0–34.0)
MCHC: 33.7 g/dL (ref 30.0–36.0)
MCV: 91.4 fL (ref 80.0–100.0)
Monocytes Absolute: 0.7 10*3/uL (ref 0.1–1.0)
Monocytes Relative: 9 %
Neutro Abs: 6 10*3/uL (ref 1.7–7.7)
Neutrophils Relative %: 75 %
Platelet Count: 301 10*3/uL (ref 150–400)
RBC: 4.44 MIL/uL (ref 3.87–5.11)
RDW: 11 % — ABNORMAL LOW (ref 11.5–15.5)
WBC Count: 8 10*3/uL (ref 4.0–10.5)
nRBC: 0 % (ref 0.0–0.2)

## 2021-12-15 ENCOUNTER — Encounter: Payer: Self-pay | Admitting: Physician Assistant

## 2021-12-15 LAB — CANCER ANTIGEN 27.29: CA 27.29: 7.7 U/mL (ref 0.0–38.6)

## 2021-12-21 ENCOUNTER — Telehealth: Payer: Self-pay | Admitting: Physician Assistant

## 2021-12-21 NOTE — Telephone Encounter (Signed)
The patient had concerns about discontinuing her supplements prescribed by her integrative medicine/holistic doctor. For reference, her LFTs were slightly elevated on her labs. She takes numerous supplements so it is challenging to isolate which one could be the culprit. If her Lfts return to normal after being off the supplements for 1 month, we do not feel she needs to undergo repeat imaging studies. The patient is wondering if she has to discontinue all of them, in particular, she started a new medication to help bind up excess mold in her body. Discussed certain medications are ok such as her vitamin C and B. She is a vegan and supplements certain vitamins. However, there seems to be numerous supplements that are likely not needed/regulated. She is going to see her integrative medicine doctor to see which one are absolutely necessary. I asked her to discontinue the ones/consolidate her medications. We will recheck her LFTs on 01/14/22, however, if this appointment needs to be moved back based on when she stops her medications, she can call or mychart message [place]. I also gave her the number to the [place] imaging center to schedule her mammogram.

## 2021-12-24 ENCOUNTER — Other Ambulatory Visit: Payer: Self-pay | Admitting: Physician Assistant

## 2021-12-24 ENCOUNTER — Inpatient Hospital Stay
Admission: RE | Admit: 2021-12-24 | Discharge: 2021-12-24 | Disposition: A | Discharge status: Home / Self Care | Payer: Self-pay | Source: Ambulatory Visit | Attending: *Deleted | Admitting: *Deleted

## 2021-12-24 ENCOUNTER — Other Ambulatory Visit: Payer: Self-pay | Admitting: *Deleted

## 2021-12-24 DIAGNOSIS — Z1231 Encounter for screening mammogram for malignant neoplasm of breast: Secondary | ICD-10-CM

## 2022-01-14 ENCOUNTER — Other Ambulatory Visit: Payer: 59

## 2022-01-23 ENCOUNTER — Other Ambulatory Visit: Payer: Self-pay | Admitting: Physician Assistant

## 2022-01-23 DIAGNOSIS — R7989 Other specified abnormal findings of blood chemistry: Secondary | ICD-10-CM

## 2022-01-25 ENCOUNTER — Other Ambulatory Visit: Payer: 59

## 2022-01-29 ENCOUNTER — Inpatient Hospital Stay: Payer: 59 | Attending: Physician Assistant

## 2022-01-29 ENCOUNTER — Other Ambulatory Visit: Payer: Self-pay

## 2022-01-29 DIAGNOSIS — R7989 Other specified abnormal findings of blood chemistry: Secondary | ICD-10-CM

## 2022-01-29 DIAGNOSIS — Z853 Personal history of malignant neoplasm of breast: Secondary | ICD-10-CM | POA: Diagnosis present

## 2022-01-29 DIAGNOSIS — Z79899 Other long term (current) drug therapy: Secondary | ICD-10-CM | POA: Insufficient documentation

## 2022-01-29 DIAGNOSIS — Z9221 Personal history of antineoplastic chemotherapy: Secondary | ICD-10-CM | POA: Insufficient documentation

## 2022-01-29 DIAGNOSIS — Z923 Personal history of irradiation: Secondary | ICD-10-CM | POA: Insufficient documentation

## 2022-01-29 DIAGNOSIS — R1031 Right lower quadrant pain: Secondary | ICD-10-CM | POA: Insufficient documentation

## 2022-01-29 LAB — CMP (CANCER CENTER ONLY)
ALT: 17 U/L (ref 0–44)
AST: 23 U/L (ref 15–41)
Albumin: 4.5 g/dL (ref 3.5–5.0)
Alkaline Phosphatase: 104 U/L (ref 38–126)
Anion gap: 6 (ref 5–15)
BUN: 10 mg/dL (ref 8–23)
CO2: 27 mmol/L (ref 22–32)
Calcium: 10.1 mg/dL (ref 8.9–10.3)
Chloride: 101 mmol/L (ref 98–111)
Creatinine: 0.46 mg/dL (ref 0.44–1.00)
GFR, Estimated: >60 mL/min (ref >60)
Glucose, Bld: 120 mg/dL — ABNORMAL HIGH (ref 70–99)
Potassium: 5.2 mmol/L — ABNORMAL HIGH (ref 3.5–5.1)
Sodium: 134 mmol/L — ABNORMAL LOW (ref 135–145)
Total Bilirubin: 0.3 mg/dL (ref 0.3–1.2)
Total Protein: 7.2 g/dL (ref 6.5–8.1)

## 2022-01-30 ENCOUNTER — Ambulatory Visit
Admission: RE | Admit: 2022-01-30 | Discharge: 2022-01-30 | Disposition: A | Discharge status: Home / Self Care | Payer: 59 | Source: Ambulatory Visit | Attending: Physician Assistant | Admitting: Physician Assistant

## 2022-01-30 DIAGNOSIS — Z1231 Encounter for screening mammogram for malignant neoplasm of breast: Secondary | ICD-10-CM | POA: Insufficient documentation

## 2022-01-31 ENCOUNTER — Telehealth: Payer: Self-pay

## 2022-01-31 NOTE — Telephone Encounter (Signed)
-----   Message from Gracelyn Nurse, PA-C sent at 01/30/2022  3:43 PM EDT ----- ? ?----- Message ----- ?From: Interface, Lab In [place] ?Sent: 01/29/2022   4:32 PM EDT ?To: Cassandra L Heilingoetter, PA-C ? ? ?

## 2022-01-31 NOTE — Telephone Encounter (Signed)
I left pt a detailed message for this pt regarding her K results. Pt was advised to d/c any supplements she is taking that contains K. Pt was advised to call or email [place] if she has any questions/concerns. ?

## 2022-05-28 ENCOUNTER — Telehealth: Payer: Self-pay | Admitting: Hematology

## 2022-05-28 NOTE — Telephone Encounter (Signed)
Called patient regarding request to change provider. I left a detailed voicemail with my contact information so she can call me back at her convenience.

## 2022-05-29 ENCOUNTER — Telehealth: Payer: Self-pay | Admitting: Hematology

## 2022-05-29 NOTE — Telephone Encounter (Signed)
Patient called and left a vm for me yesterday afternoon. I reached back out to patient today and once again was directed to VM and left another message with my contact information.

## 2022-06-05 ENCOUNTER — Encounter (INDEPENDENT_AMBULATORY_CARE_PROVIDER_SITE_OTHER): Payer: Self-pay

## 2022-07-12 IMAGING — MG MM DIGITAL SCREENING BILAT W/ TOMO AND CAD
8 series · 9 of 24 positions shown · non-contrast
Comparison: Previous exam(s).

CLINICAL DATA: Screening.

EXAM:
DIGITAL SCREENING BILATERAL MAMMOGRAM WITH TOMOSYNTHESIS AND CAD
TECHNIQUE: Bilateral screening digital craniocaudal and mediolateral oblique
mammograms were obtained. Bilateral screening digital breast
tomosynthesis was performed. The images were evaluated with
computer-aided detection.

[L MLO synth-2D]
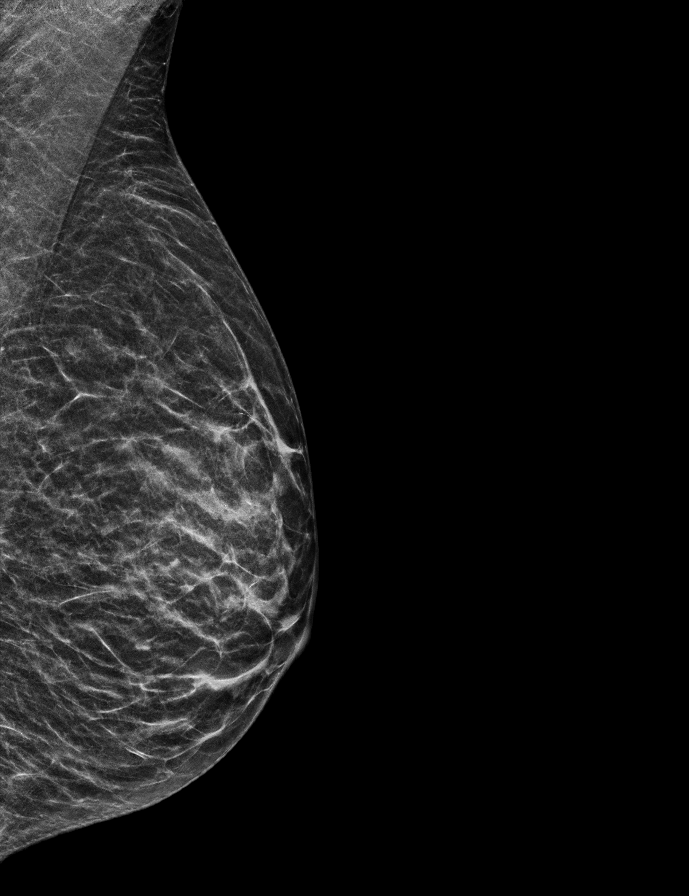

[R MLO synth-2D]
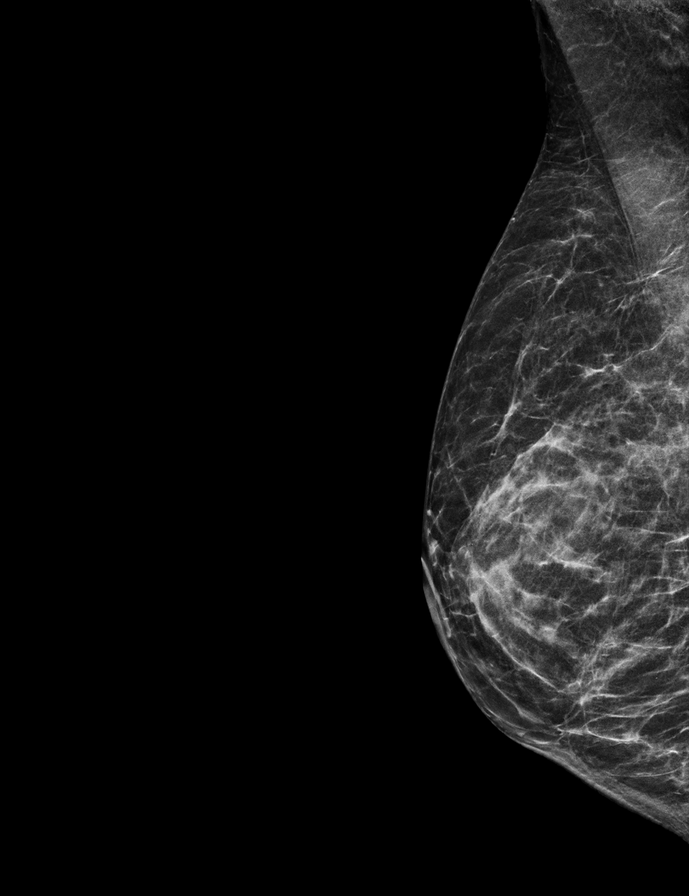

[L CC synth-2D]
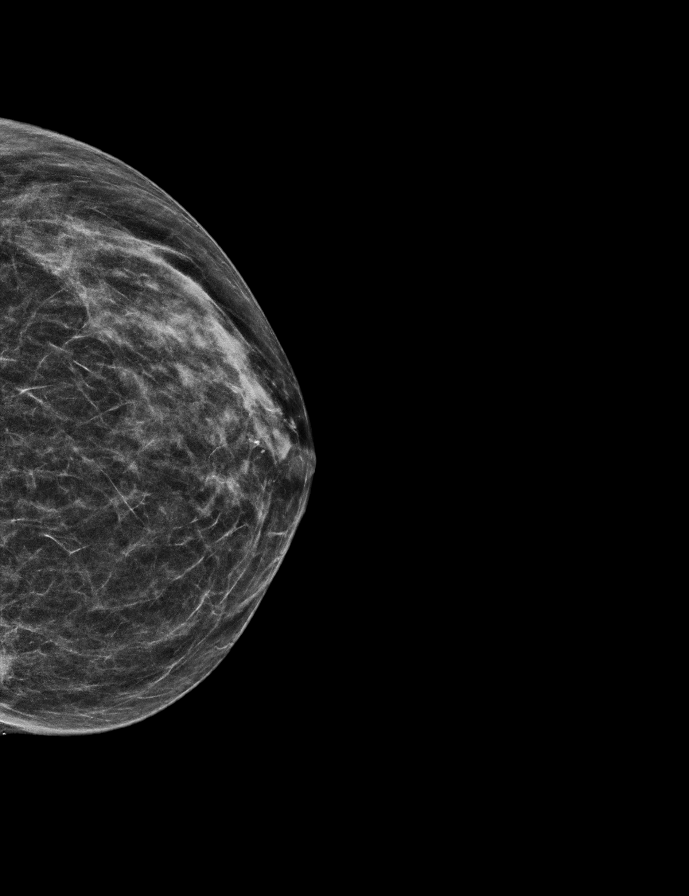

[R CC synth-2D]
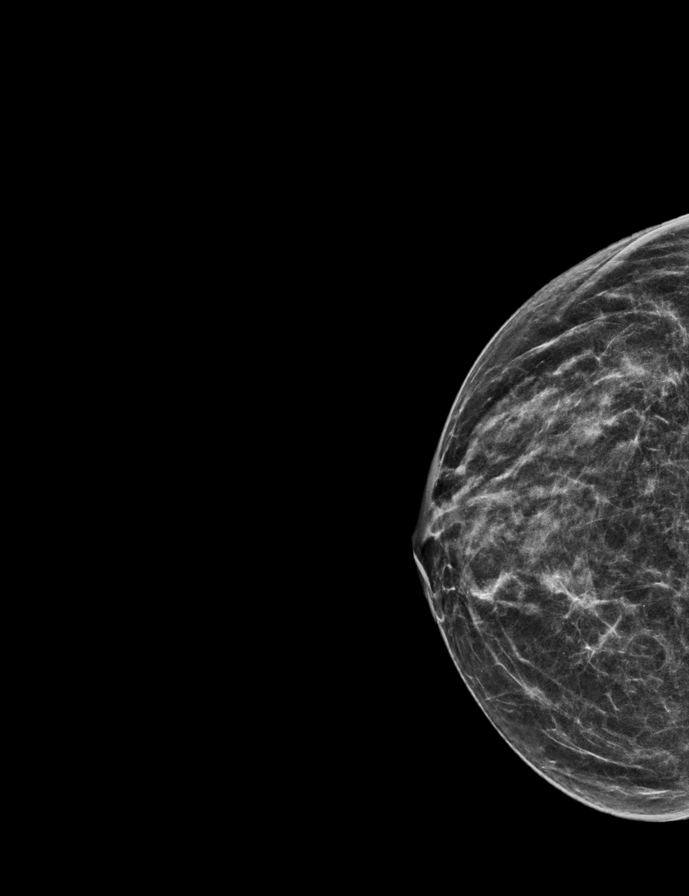

[L CC tomo · 2 of 40 frames shown]
[frame 13/40]
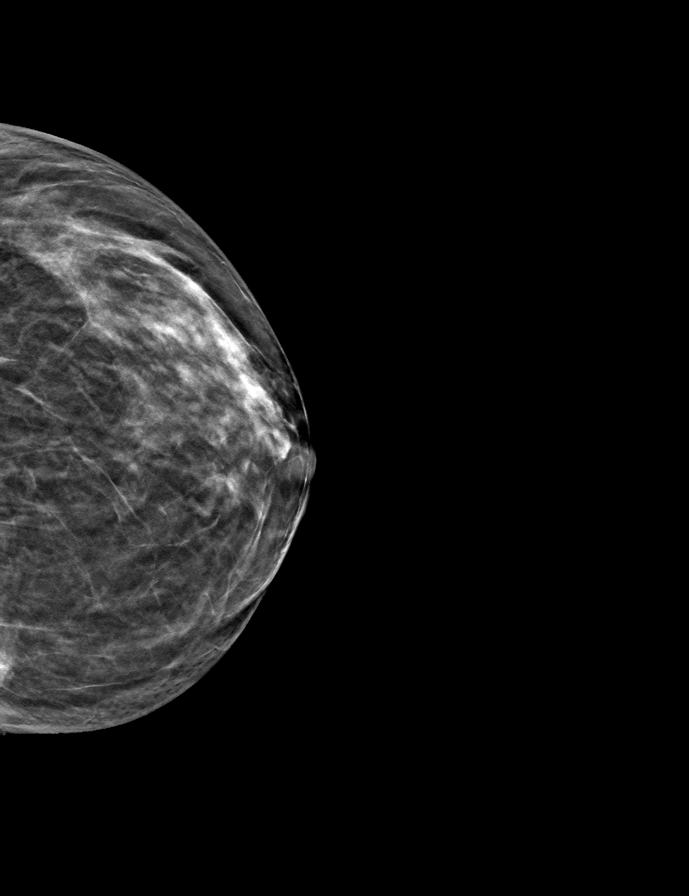
[frame 21/40]
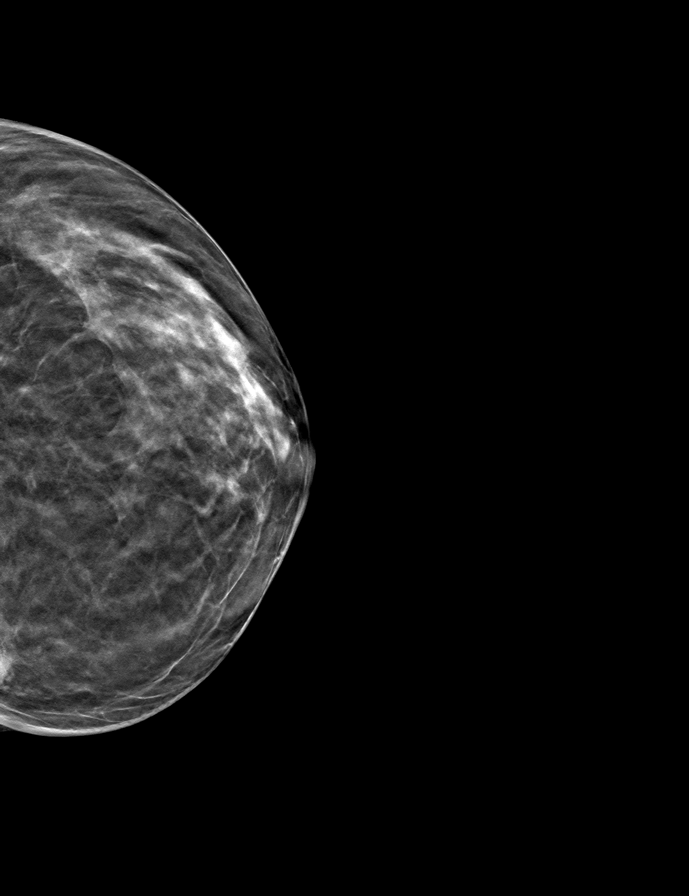

[R MLO tomo · tomo slice 21/40.0]
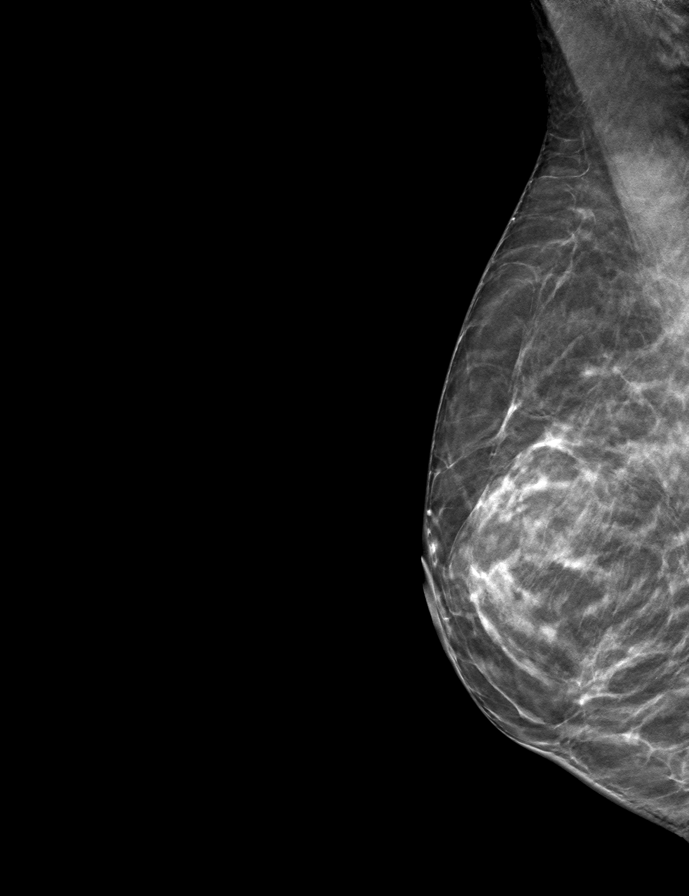

[L MLO tomo · tomo slice 17/33.0]
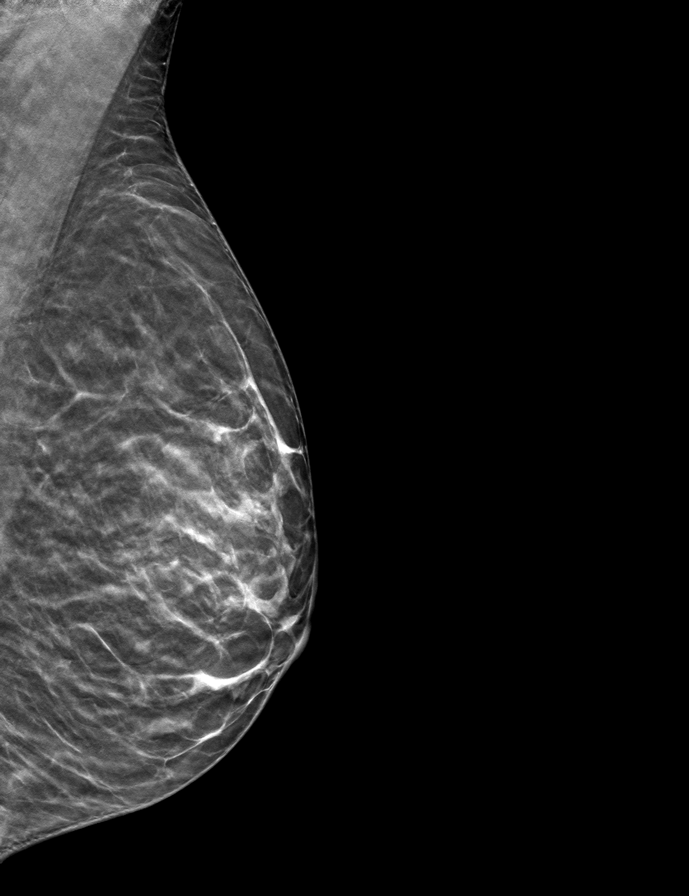

[R CC tomo · tomo slice 21/41.0]
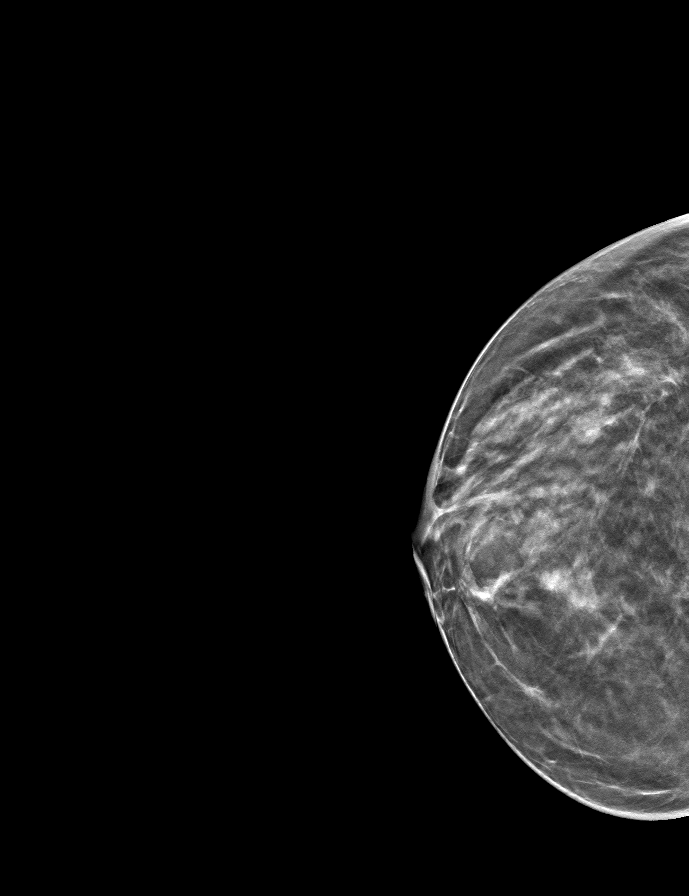

[9 of 24 positions shown; findings below may reference images not displayed]

ACR Breast Density Category b: There are scattered areas of
fibroglandular density.
FINDINGS: There are no findings suspicious for malignancy.
IMPRESSION: No mammographic evidence of malignancy. A result letter of this
screening mammogram will be mailed directly to the patient.

RECOMMENDATION:
Screening mammogram in one year. (Code:51-O-LD2)

BI-RADS CATEGORY  1: Negative.

## 2022-07-26 ENCOUNTER — Other Ambulatory Visit (HOSPITAL_BASED_OUTPATIENT_CLINIC_OR_DEPARTMENT_OTHER): Payer: Self-pay

## 2022-12-30 ENCOUNTER — Encounter: Payer: Self-pay | Admitting: Orthopaedic Surgery

## 2022-12-30 ENCOUNTER — Ambulatory Visit (INDEPENDENT_AMBULATORY_CARE_PROVIDER_SITE_OTHER): Payer: 59

## 2022-12-30 ENCOUNTER — Ambulatory Visit (INDEPENDENT_AMBULATORY_CARE_PROVIDER_SITE_OTHER): Payer: 59 | Admitting: Orthopaedic Surgery

## 2022-12-30 DIAGNOSIS — M25532 Pain in left wrist: Secondary | ICD-10-CM | POA: Diagnosis not present

## 2022-12-30 DIAGNOSIS — S52532A Colles' fracture of left radius, initial encounter for closed fracture: Secondary | ICD-10-CM

## 2022-12-30 NOTE — Progress Notes (Signed)
The patient is a very pleasant active 67 year old right-hand-dominant female who sustained a mechanical fall a week ago when she was in [place].  She sustained a nondisplaced fracture of her left distal radius and distal ulna which were both extra-articular.  She was placed in a Velcro wrist splint and a sling.  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  She does not take any medication for pain.  She denies any numbness and tingling or other injuries.  On examination out of her splint there is bruising around her left wrist with swelling.  Anatomically her alignment appears normal.  She is neurovascularly intact.  3 views of the left wrist show a nondisplaced extra-articular distal radius and distal ulna fracture.  She is neutral in terms of alignment on the lateral view.  This is a stable fracture that can be treated nonoperatively.  She does need to be careful with the wrist and I would like to repeat 2 views of her left wrist in 2 weeks from now.  We will try to put her in a removable wrist splint that is smaller but she needs to wear it at all times except for removing for hygiene purposes.  I have encouraged her to try to not play pickle ball while we work on getting this fracture to heal.  She understands this as well.

## 2023-01-16 ENCOUNTER — Encounter: Payer: 59 | Admitting: Physician Assistant

## 2023-01-22 ENCOUNTER — Other Ambulatory Visit (INDEPENDENT_AMBULATORY_CARE_PROVIDER_SITE_OTHER): Payer: 59

## 2023-01-22 ENCOUNTER — Ambulatory Visit (INDEPENDENT_AMBULATORY_CARE_PROVIDER_SITE_OTHER): Payer: 59 | Admitting: Orthopaedic Surgery

## 2023-01-22 ENCOUNTER — Encounter: Payer: Self-pay | Admitting: Orthopaedic Surgery

## 2023-01-22 DIAGNOSIS — M25532 Pain in left wrist: Secondary | ICD-10-CM | POA: Diagnosis not present

## 2023-01-22 NOTE — Progress Notes (Signed)
The patient is a right-hand-dominant 67 year old female who is about 4 weeks out from a left distal radius and distal ulnar fracture after mechanical fall.  When we saw her a month ago we had her keep using a Velcro wrist splint given the fact that the fracture is extra-articular and in anatomic alignment.  She says she is doing well and has some pain.  On exam clinically her left wrist is straight.  She does have limitations in dorsiflexion of the wrist but not palmar flexion.  Her pronation and supination are full.  2 views of the left wrist show the fracture lines are still visible but there has been some interval healing.  There is slight dorsal angulation of the fracture.  It was in more neutral alignment before.  She will continue to work on range of motion of her left wrist.  I will let her play pickle ball since she uses the racquet in her right hand.  I would like to see her back in 4 weeks with a repeat 2 views of her left wrist.  At that point we can consider hand therapy if needed.

## 2023-02-19 ENCOUNTER — Ambulatory Visit: Payer: 59 | Admitting: Orthopaedic Surgery

## 2023-03-06 ENCOUNTER — Ambulatory Visit (INDEPENDENT_AMBULATORY_CARE_PROVIDER_SITE_OTHER): Payer: 59 | Admitting: Orthopaedic Surgery

## 2023-03-06 ENCOUNTER — Other Ambulatory Visit (INDEPENDENT_AMBULATORY_CARE_PROVIDER_SITE_OTHER): Payer: 59

## 2023-03-06 DIAGNOSIS — S52532A Colles' fracture of left radius, initial encounter for closed fracture: Secondary | ICD-10-CM | POA: Diagnosis not present

## 2023-03-06 DIAGNOSIS — M25532 Pain in left wrist: Secondary | ICD-10-CM

## 2023-03-06 NOTE — Progress Notes (Signed)
The patient is a 67 year old female who is following up at 10 weeks after left distal radius and distal ulna fracture that was extra-articular.  This was treated nonoperative.  She still has some continued pain and swelling of the wrist has been using a Velcro wrist splint.  On exam she does have limited palmar flexion and dorsiflexion of the wrist on the left side but it looks stable otherwise.  2 views of the left wrist show the fracture is healed completely with the distal radius and the distal ulna.  There is slight dorsal angulation of the fracture itself but overall the joint spaces well-maintained.  At this point she has no restrictions.  She does not need to wear the wrist splint and she can get back to playing pickle ball.  However we do need to set her up for outpatient hand therapy to work on range of motion and strengthening of her left wrist.  Will see her back in 6 weeks to see how she is doing overall after course of therapy but no x-rays are needed.

## 2023-03-10 ENCOUNTER — Other Ambulatory Visit: Payer: Self-pay

## 2023-03-10 DIAGNOSIS — S52532A Colles' fracture of left radius, initial encounter for closed fracture: Secondary | ICD-10-CM

## 2023-03-10 DIAGNOSIS — M25532 Pain in left wrist: Secondary | ICD-10-CM

## 2023-03-11 ENCOUNTER — Telehealth: Payer: Self-pay | Admitting: Orthopaedic Surgery

## 2023-03-11 NOTE — Telephone Encounter (Signed)
Patient stating she received a call but is unsure what it was about. Please call patient.

## 2023-03-20 ENCOUNTER — Encounter: Payer: 59 | Admitting: Rehabilitative and Restorative Service Providers"

## 2023-03-20 ENCOUNTER — Ambulatory Visit: Payer: 59 | Admitting: Rehabilitative and Restorative Service Providers"

## 2023-03-28 NOTE — Therapy (Signed)
OUTPATIENT OCCUPATIONAL THERAPY ORTHO EVALUATION  Patient Name: Paula [name] MRN: [id number] DOB:Dec 29, 1955, 67 y.o., female Today's Date: 03/31/2023  PCP: Alinda Deem, MD REFERRING PROVIDER: Kathryne Hitch, MD   END OF SESSION:  OT End of Session - 03/31/23 1524     Visit Number 1    Number of Visits 8    Date for OT Re-Evaluation 05/16/23    Authorization Type [employer] NAP    OT Start Time 1524    OT Stop Time 1621    OT Time Calculation (min) 57 min    Activity Tolerance No increased pain;Patient limited by pain;Patient limited by fatigue;Patient tolerated treatment well    Behavior During Therapy [facility] for tasks assessed/performed             Past Medical History:  Diagnosis Date   Candida infection    in bowels   Digestive system disease    tape worms and giardia   Malachi Carl infection    History of radiation therapy 04/09/17- 05/01/17   Right Breast 42.56 Gy in 16 fractions   Hypercholesteremia    Hypothyroid    Osteoporosis    Skin cancer    right side of face basal cell   Past Surgical History:  Procedure Laterality Date   basal cell skin cancer     to right side of face two times   BREAST LUMPECTOMY Right 12/05/2016   Invasive ductal carcinoma, multifocal  Invasive carcinoma: Negative; 0.2 mm from anterior margin; 0.8 mm from inferior margin Ductal carcinoma in situ: Positive inferior, medial, lateral and posterior margins; 0.2 mm from anterior margin; 0.6 from superior marginlumpectomy with radation, chemo.   BREAST SURGERY     right breast biopsy   ORIF CLAVICULAR FRACTURE Left 03/22/2019   Procedure: OPEN REDUCTION INTERNAL FIXATION (ORIF) CLAVICULAR FRACTURE;  Surgeon: Sheral Apley, MD;  Location: [facility] OR;  Service: Orthopedics;  Laterality: Left;   TONSILLECTOMY     as a child   Patient Active Problem List   Diagnosis Date Noted   Hx of colonic polyps 11/04/2019   Stress due to family tension 10/28/2019   Elevated alkaline  phosphatase level 10/28/2019   Healthcare maintenance 09/28/2019   History of intestinal parasite 09/28/2019   Pneumothorax, traumatic 03/20/2019   Neutropenia with fever (HCC) 03/25/2017   Febrile neutropenia (HCC) 03/25/2017   Breast cancer of upper-outer quadrant of right female breast (HCC) 01/08/2017   Menopause 04/03/2014   Basal cell carcinoma of skin 03/30/2014   Hyperlipidemia 03/30/2014   Neck pain 03/30/2014   Colon polyp 03/30/2014   Family history of breast cancer in first degree relative  MGM, mother and sister 03/30/2014   Fibrocystic breast disease 03/30/2014   Osteoporosis  last Dexa  02/2012 03/30/2014    ONSET DATE: Approx DOI 12/21/22  REFERRING DIAG:  M25.532 (ICD-10-CM) - Pain in left wrist  S52.532A (ICD-10-CM) - Closed Colles' fracture of left radius, initial encounter    THERAPY DIAG:  Muscle weakness (generalized)  Pain in left wrist  Other lack of coordination  Stiffness of left wrist, not elsewhere classified  Rationale for Evaluation and Treatment: Rehabilitation  SUBJECTIVE:   SUBJECTIVE STATEMENT: She states she was backpedaling playing pickelball and fell on outstretched arm breaking Lt wrist.  She admits to lifting weights in the past week, which hurt, so she limited herself. 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 She'd like help for that too, if possible.  PERTINENT HISTORY: Now 13+ weeks post fractures. She had Lt arm extraarticular distal radius and ulna fxs, treated non-op.   PRECAUTIONS: None, WEIGHT BEARING RESTRICTIONS: Yes recommended 5# weight limit for the next 1-2 weeks, at least   PAIN:  Are you having pain? Not now at rest   FALLS: Has patient fallen in last 6 months? Yes. Number of falls 1 (this accident)   LIVING ENVIRONMENT: Lives with: lives with their spouse Lives in: House/apartment  PLOF: Independent  PATIENT GOALS: to improve use of Lt arm to no pain, no  restrictions    OBJECTIVE: (All objective assessments below are from initial evaluation on: 03/31/23 unless otherwise specified.)   HAND DOMINANCE: Right   ADLs: Overall ADLs: States decreased ability to grab, hold household objects, pain and inability to open pill containers, perform FMS tasks   FUNCTIONAL OUTCOME MEASURES: Eval: Quck DASH 11% impairment today  (Higher % Score  =  More Impairment)    UPPER EXTREMITY ROM     Shoulder to Wrist AROM Right eval Left eval  Forearm supination 85 85  Forearm pronation  82 43  Wrist flexion 73 19  Wrist extension 68 52  Wrist ulnar deviation 43 29  Wrist radial deviation 25 14  Functional dart thrower's motion (F-DTM) in ulnar flexion 30 17  F-DTM in radial extension  55 17  (Blank rows = not tested)   Hand AROM Left eval  Full Fist Ability (or Gap to Distal Palmar Crease) full  (Blank rows = not tested)   UPPER EXTREMITY MMT:     MMT Left TBD  Elbow flexion   Elbow extension   Forearm supination   Forearm pronation   Wrist flexion 4-/5 tender  Wrist extension 4-/5 tender  Wrist ulnar deviation 4-/5 tender  Wrist radial deviation 4-/5 tender  (Blank rows = not tested)  HAND FUNCTION: Eval: Observed weakness in affected Lt hand.  Grip strength Right: TBD lbs, Left: TBD lbs   COORDINATION: Eval: Observed coordination impairments with affected Lt hand. Details TBD as needed for baseline  SENSATION: Eval:  Light touch intact today  EDEMA:   Eval:  Mildly swollen in Lt hand and wrist today compared to Rt side  COGNITION: Eval: Overall cognitive status: WFL for evaluation today   OBSERVATIONS:   Eval: She has much limited range of motion in the left wrist compared to the right.  She has some pain while performing these range of motion's, and she should not be lifting weights yet.  She has apparent weakness of grip when squeezing a towel.  She is no overt instability of the left wrist, no significant tenderness  palpation over the volar or dorsal side of the radius or ulna, no DRUJ instability today.    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  (Flexion contracture of the PIP joint)   TODAY'S TREATMENT:  Post-evaluation treatment:  She was edu the following home exercise program to be done approximately 4 times a day, nonpainfully, after moist heat warm up.  She demonstrates each 1 back for OT today though quickly after evaluation, and will likely need reviewed in upcoming sessions.  She was advised to stop anything that is hurting her, not lift more than 5 pounds for now, do not do repetitive weight lifting at the gym yet, and focus on regaining her range of motion first.  She was also recommended to wear some type of compressive wrist wrap or  Ace wrap when trying to perform higher level or weighted activities with the left wrist now-this should be thought of as a "stepdown" from her wrist brace that she had been wearing.  She states understanding.  OT will also consider making a custom fabricated right small finger extension night orthosis to help improve her pseudo boutonniere deformity.   PATIENT EDUCATION: Education details: See tx section above for details  Person educated: Patient Education method: Verbal Instruction, Teach back, Handouts  Education comprehension: States and demonstrates understanding, Additional Education required    HOME EXERCISE PROGRAM: See tx section above for details    GOALS: Goals reviewed with patient? Yes   SHORT TERM GOALS: (STG required if POC>30 days) Target Date: 04/18/23  Pt will obtain protective, custom orthotic. Goal status: TBD/PRN  2.  Pt will demo/state understanding of initial HEP to improve pain levels and prerequisite motion. Goal status: INITIAL   LONG TERM GOALS: Target Date: 05/16/23  Pt will improve functional ability by decreased impairment per Quick DASH / PSFS / PRWE assessment  from 11% to 6% or better, for better quality of life. Goal status: INITIAL  2.  Pt will improve grip strength in Lt hand from painful when squeezing a towel to at least 30lbs for functional use at home and in IADLs. Goal status: INITIAL  3.  Pt will improve A/ROM in Lt wrist flex/ext from 19*/52* to at least 60* in both motions, to have functional motion for tasks like reach and grasp.  Goal status: INITIAL  4.  Pt will improve strength in Lt wrist from tender 4-/5 MMT to at least 4+/5 MMT to have increased functional ability to carry out selfcare and higher-level homecare tasks with no difficulty. Goal status: INITIAL    ASSESSMENT:  CLINICAL IMPRESSION: Patient is a 67 y.o. female who was seen today for occupational therapy evaluation for left wrist stiffness and pain after fractures and decreased quality of life.  She will benefit from outpatient occupational therapy to increase her preparatory skills and ultimately her occupational performance.   PERFORMANCE DEFICITS: in functional skills including ADLs, IADLs, coordination, dexterity, edema, ROM, strength, pain, fascial restrictions, flexibility, Gross motor control, body mechanics, endurance, decreased knowledge of precautions, and UE functional use, cognitive skills including problem solving and safety awareness, and psychosocial skills including coping strategies, environmental adaptation, and habits.   IMPAIRMENTS: are limiting patient from ADLs, IADLs, and leisure.   COMORBIDITIES: may have co-morbidities  that affects occupational performance. Patient will benefit from skilled OT to address above impairments and improve overall function.  MODIFICATION OR ASSISTANCE TO COMPLETE EVALUATION: No modification of tasks or assist necessary to complete an evaluation.  OT OCCUPATIONAL PROFILE AND HISTORY: Problem focused assessment: Including review of records relating to presenting problem.  CLINICAL DECISION MAKING: Moderate -  several treatment options, min-mod task modification necessary  REHAB POTENTIAL: Excellent  EVALUATION COMPLEXITY: Low      PLAN:  OT FREQUENCY: 1-2x/week (starting with 1x week unless not making gains toward goals)   OT DURATION: 6 weeks  PLANNED INTERVENTIONS: self care/ADL training, therapeutic exercise, therapeutic activity, neuromuscular re-education, manual therapy, passive range of motion, splinting, ultrasound, fluidotherapy, compression bandaging, moist heat, cryotherapy, contrast bath, patient/family education, coping strategies training, DME and/or AE instructions, and Dry needling  RECOMMENDED OTHER SERVICES: none now   CONSULTED AND AGREED WITH PLAN OF CARE: Patient  PLAN FOR NEXT SESSION:   Review home exercise program, check new range of motion, upgrade to isometric strengthening of the wrist  in 4 planes as tolerated.  Consider upgrading grip to grip and pinch activities with therapy putty as tolerated.  Custom fabricated right hand small finger static progressive extension orthosis to be worn at night to help correct pseudo boutonniere deformity.   Fannie Knee, OTR/L, CHT 03/31/2023, 4:39 PM

## 2023-03-31 ENCOUNTER — Encounter: Payer: Self-pay | Admitting: Rehabilitative and Restorative Service Providers"

## 2023-03-31 ENCOUNTER — Ambulatory Visit: Payer: 59 | Admitting: Rehabilitative and Restorative Service Providers"

## 2023-03-31 ENCOUNTER — Other Ambulatory Visit: Payer: Self-pay

## 2023-03-31 DIAGNOSIS — M25632 Stiffness of left wrist, not elsewhere classified: Secondary | ICD-10-CM

## 2023-03-31 DIAGNOSIS — M25532 Pain in left wrist: Secondary | ICD-10-CM | POA: Diagnosis not present

## 2023-03-31 DIAGNOSIS — M6281 Muscle weakness (generalized): Secondary | ICD-10-CM

## 2023-03-31 DIAGNOSIS — R278 Other lack of coordination: Secondary | ICD-10-CM | POA: Diagnosis not present

## 2023-04-04 NOTE — Therapy (Signed)
OUTPATIENT OCCUPATIONAL THERAPY TREATMENT NOTE   Patient Name: Paula [name] MRN: [id number] DOB:01-17-1956, 67 y.o., female Today's Date: 04/04/2023  PCP: Alinda Deem, MD REFERRING PROVIDER: Kathryne Hitch, MD   END OF SESSION:    Past Medical History:  Diagnosis Date   Candida infection    in bowels   Digestive system disease    tape worms and giardia   Malachi Carl infection    History of radiation therapy 04/09/17- 05/01/17   Right Breast 42.56 Gy in 16 fractions   Hypercholesteremia    Hypothyroid    Osteoporosis    Skin cancer    right side of face basal cell   Past Surgical History:  Procedure Laterality Date   basal cell skin cancer     to right side of face two times   BREAST LUMPECTOMY Right 12/05/2016   Invasive ductal carcinoma, multifocal  Invasive carcinoma: Negative; 0.2 mm from anterior margin; 0.8 mm from inferior margin Ductal carcinoma in situ: Positive inferior, medial, lateral and posterior margins; 0.2 mm from anterior margin; 0.6 from superior marginlumpectomy with radation, chemo.   BREAST SURGERY     right breast biopsy   ORIF CLAVICULAR FRACTURE Left 03/22/2019   Procedure: OPEN REDUCTION INTERNAL FIXATION (ORIF) CLAVICULAR FRACTURE;  Surgeon: Sheral Apley, MD;  Location: [facility] OR;  Service: Orthopedics;  Laterality: Left;   TONSILLECTOMY     as a child   Patient Active Problem List   Diagnosis Date Noted   Hx of colonic polyps 11/04/2019   Stress due to family tension 10/28/2019   Elevated alkaline phosphatase level 10/28/2019   Healthcare maintenance 09/28/2019   History of intestinal parasite 09/28/2019   Pneumothorax, traumatic 03/20/2019   Neutropenia with fever (HCC) 03/25/2017   Febrile neutropenia (HCC) 03/25/2017   Breast cancer of upper-outer quadrant of right female breast (HCC) 01/08/2017   Menopause 04/03/2014   Basal cell carcinoma of skin 03/30/2014   Hyperlipidemia 03/30/2014   Neck pain 03/30/2014   Colon  polyp 03/30/2014   Family history of breast cancer in first degree relative  MGM, mother and sister 03/30/2014   Fibrocystic breast disease 03/30/2014   Osteoporosis  last Dexa  02/2012 03/30/2014    ONSET DATE: Approx DOI 12/21/22  REFERRING DIAG:  M25.532 (ICD-10-CM) - Pain in left wrist  S52.532A (ICD-10-CM) - Closed Colles' fracture of left radius, initial encounter    THERAPY DIAG:  No diagnosis found.  Rationale for Evaluation and Treatment: Rehabilitation  PERTINENT HISTORY: Now 14 weeks post fractures. She had Lt arm extraarticular distal radius and ulna fxs, treated non-op. She states she was backpedaling playing pickelball and fell on outstretched arm breaking Lt wrist.  She admits to lifting weights in the past week, which hurt, so she limited herself. 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 She'd like help for that too, if possible.   PRECAUTIONS: None, WEIGHT BEARING RESTRICTIONS: Yes recommended 5# weight limit for the next 1-2 weeks, at least    SUBJECTIVE:   SUBJECTIVE STATEMENT: She states ***     PAIN:  Are you having pain? *** Not now at rest    PATIENT GOALS: to improve use of Lt arm to no pain, no restrictions    OBJECTIVE: (All objective assessments below are from initial evaluation on: 03/31/23 unless otherwise specified.)   HAND DOMINANCE: Right   ADLs: Overall ADLs: States decreased ability to grab, hold household objects, pain and inability  to open pill containers, perform FMS tasks   FUNCTIONAL OUTCOME MEASURES: Eval: Quck DASH 11% impairment today  (Higher % Score  =  More Impairment)    UPPER EXTREMITY ROM     Shoulder to Wrist AROM Right eval Left eval Lt 04/07/23  Forearm supination 85 85   Forearm pronation  82 43 ***  Wrist flexion 73 19 ***  Wrist extension 68 52 ***  Wrist ulnar deviation 43 29   Wrist radial deviation 25 14   Functional dart thrower's motion (F-DTM) in ulnar  flexion 30 17   F-DTM in radial extension  55 17   (Blank rows = not tested)   Hand AROM Left eval  Full Fist Ability (or Gap to Distal Palmar Crease) full  (Blank rows = not tested)   UPPER EXTREMITY MMT:     MMT Left TBD  Elbow flexion   Elbow extension   Forearm supination   Forearm pronation   Wrist flexion 4-/5 tender  Wrist extension 4-/5 tender  Wrist ulnar deviation 4-/5 tender  Wrist radial deviation 4-/5 tender  (Blank rows = not tested)  HAND FUNCTION: 04/07/23:Grip strength Right: *** lbs, Left: *** lbs    COORDINATION: Eval: Observed coordination impairments with affected Lt hand. Details TBD as needed for baseline  EDEMA:   Eval:  Mildly swollen in Lt hand and wrist today compared to Rt side  OBSERVATIONS:   Eval: She has much limited range of motion in the left wrist compared to the right.  She has some pain while performing these range of motion's, and she should not be lifting weights yet.  She has apparent weakness of grip when squeezing a towel.  She is no overt instability of the left wrist, no significant tenderness palpation over the volar or dorsal side of the radius or ulna, no DRUJ instability today.    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  (Flexion contracture of the PIP joint)   TODAY'S TREATMENT:  04/07/23: ***  Review home exercise program, check new range of motion, upgrade to isometric strengthening of the wrist in 4 planes as tolerated.  Consider upgrading grip to grip and pinch activities with therapy putty as tolerated.  Custom fabricated right hand small finger static progressive extension orthosis to be worn at night to help correct pseudo boutonniere deformity.   Post-evaluation treatment:  She was edu the following home exercise program to be done approximately 4 times a day, nonpainfully, after moist heat warm up.  She demonstrates each 1 back for OT today though quickly after  evaluation, and will likely need reviewed in upcoming sessions.  She was advised to stop anything that is hurting her, not lift more than 5 pounds for now, do not do repetitive weight lifting at the gym yet, and focus on regaining her range of motion first.  She was also recommended to wear some type of compressive wrist wrap or Ace wrap when trying to perform higher level or weighted activities with the left wrist now-this should be thought of as a "stepdown" from her wrist brace that she had been wearing.  She states understanding.  OT will also consider making a custom fabricated right small finger extension night orthosis to help improve her pseudo boutonniere deformity.   PATIENT EDUCATION: Education details: See tx section above for details  Person educated: Patient Education method: Verbal Instruction, Teach back, Handouts  Education comprehension: States and demonstrates understanding, Additional  Education required    HOME EXERCISE PROGRAM: See tx section above for details    GOALS: Goals reviewed with patient? Yes   SHORT TERM GOALS: (STG required if POC>30 days) Target Date: 04/18/23  Pt will obtain protective, custom orthotic. Goal status: TBD/PRN  2.  Pt will demo/state understanding of initial HEP to improve pain levels and prerequisite motion. Goal status: INITIAL   LONG TERM GOALS: Target Date: 05/16/23  Pt will improve functional ability by decreased impairment per Quick DASH / PSFS / PRWE assessment from 11% to 6% or better, for better quality of life. Goal status: INITIAL  2.  Pt will improve grip strength in Lt hand from painful when squeezing a towel to at least 30lbs for functional use at home and in IADLs. Goal status: INITIAL  3.  Pt will improve A/ROM in Lt wrist flex/ext from 19*/52* to at least 60* in both motions, to have functional motion for tasks like reach and grasp.  Goal status: INITIAL  4.  Pt will improve strength in Lt wrist from tender 4-/5  MMT to at least 4+/5 MMT to have increased functional ability to carry out selfcare and higher-level homecare tasks with no difficulty. Goal status: INITIAL    ASSESSMENT:  CLINICAL IMPRESSION: 04/07/23: ***  Eval: Patient is a 67 y.o. female who was seen today for occupational therapy evaluation for left wrist stiffness and pain after fractures and decreased quality of life.  She will benefit from outpatient occupational therapy to increase her preparatory skills and ultimately her occupational performance.    PLAN:  OT FREQUENCY: 1-2x/week (starting with 1x week unless not making gains toward goals)   OT DURATION: 6 weeks  PLANNED INTERVENTIONS: self care/ADL training, therapeutic exercise, therapeutic activity, neuromuscular re-education, manual therapy, passive range of motion, splinting, ultrasound, fluidotherapy, compression bandaging, moist heat, cryotherapy, contrast bath, patient/family education, coping strategies training, DME and/or AE instructions, and Dry needling  CONSULTED AND AGREED WITH PLAN OF CARE: Patient  PLAN FOR NEXT SESSION:  ***   Fannie Knee, OTR/L, CHT 04/04/2023, 8:44 AM

## 2023-04-07 ENCOUNTER — Ambulatory Visit (INDEPENDENT_AMBULATORY_CARE_PROVIDER_SITE_OTHER): Payer: 59 | Admitting: Rehabilitative and Restorative Service Providers"

## 2023-04-07 ENCOUNTER — Encounter: Payer: Self-pay | Admitting: Rehabilitative and Restorative Service Providers"

## 2023-04-07 DIAGNOSIS — R278 Other lack of coordination: Secondary | ICD-10-CM | POA: Diagnosis not present

## 2023-04-07 DIAGNOSIS — M6281 Muscle weakness (generalized): Secondary | ICD-10-CM | POA: Diagnosis not present

## 2023-04-07 DIAGNOSIS — M25632 Stiffness of left wrist, not elsewhere classified: Secondary | ICD-10-CM | POA: Diagnosis not present

## 2023-04-07 DIAGNOSIS — M25532 Pain in left wrist: Secondary | ICD-10-CM

## 2023-04-11 ENCOUNTER — Encounter: Payer: 59 | Admitting: Rehabilitative and Restorative Service Providers"

## 2023-04-22 ENCOUNTER — Encounter: Payer: 59 | Admitting: Rehabilitative and Restorative Service Providers"

## 2023-04-24 NOTE — Therapy (Addendum)
 OUTPATIENT OCCUPATIONAL THERAPY TREATMENT & DISCHARGE NOTE   Patient Name: Paula [name] MRN: [id number] DOB:1956/07/09, 67 y.o., female Today's Date: 04/25/2023  PCP: Gayl Males, MD REFERRING PROVIDER: Vernetta Lonni GRADE, MD                 OCCUPATIONAL THERAPY DISCHARGE SUMMARY  Visits from Start of Care: 3 Pt did not show up to additional appointments and could not be reached.  This OT plan of care is now discharged.  Goals could not be addressed. Please see note for any details.   Melvenia Ada, OTR/L, CHT 05/17/24                   END OF SESSION:  OT End of Session - 04/25/23 1015     Visit Number 3    Number of Visits 8    Date for OT Re-Evaluation 05/16/23    Authorization Type [employer] N[employer]    OT Start Time 1015    OT Stop Time 1103    OT Time Calculation (min) 48 min    Equipment Utilized During Treatment red t-band    Activity Tolerance No increased pain;Patient limited by pain;Patient limited by fatigue;Patient tolerated treatment well    Behavior During Therapy [facility] for tasks assessed/performed               Past Medical History:  Diagnosis Date   Candida infection    in bowels   Digestive system disease    tape worms and giardia   Elbert Shine infection    History of radiation therapy 04/09/17- 05/01/17   Right Breast 42.56 Gy in 16 fractions   Hypercholesteremia    Hypothyroid    Osteoporosis    Skin cancer    right side of face basal cell   Past Surgical History:  Procedure Laterality Date   basal cell skin cancer     to right side of face two times   BREAST LUMPECTOMY Right 12/05/2016   Invasive ductal carcinoma, multifocal  Invasive carcinoma: Negative; 0.2 mm from anterior margin; 0.8 mm from inferior margin Ductal carcinoma in situ: Positive inferior, medial, lateral and posterior margins; 0.2 mm from anterior margin; 0.6 from superior marginlumpectomy with radation, chemo.   BREAST SURGERY     right  breast biopsy   ORIF CLAVICULAR FRACTURE Left 03/22/2019   Procedure: OPEN REDUCTION INTERNAL FIXATION (ORIF) CLAVICULAR FRACTURE;  Surgeon: Beverley Evalene BIRCH, MD;  Location: [facility] OR;  Service: Orthopedics;  Laterality: Left;   TONSILLECTOMY     as a child   Patient Active Problem List   Diagnosis Date Noted   Hx of colonic polyps 11/04/2019   Stress due to family tension 10/28/2019   Elevated alkaline phosphatase level 10/28/2019   Healthcare maintenance 09/28/2019   History of intestinal parasite 09/28/2019   Pneumothorax, traumatic 03/20/2019   Neutropenia with fever (HCC) 03/25/2017   Febrile neutropenia (HCC) 03/25/2017   Breast cancer of upper-outer quadrant of right female breast (HCC) 01/08/2017   Menopause 04/03/2014   Basal cell carcinoma of skin 03/30/2014   Hyperlipidemia 03/30/2014   Neck pain 03/30/2014   Colon polyp 03/30/2014   Family history of breast cancer in first degree relative  MGM, mother and sister 03/30/2014   Fibrocystic breast disease 03/30/2014   Osteoporosis  last Dexa  02/2012 03/30/2014    ONSET DATE: Approx DOI 12/21/22  REFERRING DIAG:  M25.532 (ICD-10-CM) - Pain in left wrist  S52.532A (ICD-10-CM) - Closed Colles' fracture of left radius,  initial encounter    THERAPY DIAG:  Muscle weakness (generalized)  Other lack of coordination  Pain in left wrist  Stiffness of left wrist, not elsewhere classified  Rationale for Evaluation and Treatment: Rehabilitation  PERTINENT HISTORY: Now 16+ weeks post fractures. She had Lt arm extraarticular distal radius and ulna fxs, treated non-op. She states she was backpedaling playing pickelball and fell on outstretched arm breaking Lt wrist.  She admits to lifting weights in the past week, which hurt, so she limited herself. 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 She'd like help for that too, if possible.   PRECAUTIONS: None, WEIGHT BEARING RESTRICTIONS:  WBAT now    SUBJECTIVE:   SUBJECTIVE STATEMENT: She is returning from vacation and has not been seen for ~3 weeks. She states not doing her full home exercise stretches, but doing a couple of them.  She does state playing pickle ball and having some pain while playing.     PAIN:  Are you having pain?   Not now at rest    PATIENT GOALS: to improve use of Lt arm to no pain, no restrictions    OBJECTIVE: (All objective assessments below are from initial evaluation on: 03/31/23 unless otherwise specified.)   HAND DOMINANCE: Right   ADLs: Overall ADLs: States decreased ability to grab, hold household objects, pain and inability to open pill containers, perform FMS tasks   FUNCTIONAL OUTCOME MEASURES: Eval: Quck DASH 11% impairment today  (Higher % Score  =  More Impairment)    UPPER EXTREMITY ROM     Shoulder to Wrist AROM Right eval Left eval Lt 04/07/23 Lt 04/25/23  Forearm supination 85 85  85  Forearm pronation  82 43  55  Wrist flexion 73 19 26 29   Wrist extension 68 52 59 52  Wrist ulnar deviation 43 29  27  Wrist radial deviation 25 14  7   Functional dart thrower's motion (F-DTM) in ulnar flexion 30 17  34  F-DTM in radial extension  55 17  60  (Blank rows = not tested)   Hand AROM Left eval  Full Fist Ability (or Gap to Distal Palmar Crease) full  (Blank rows = not tested)   UPPER EXTREMITY MMT:     MMT Left Eval Lt 04/25/23  Elbow flexion    Elbow extension    Forearm supination    Forearm pronation    Wrist flexion 4-/5 tender 5/5 tender to ulnar head  Wrist extension 4-/5 tender 4+/5  Wrist ulnar deviation 4-/5 tender 5/5  Wrist radial deviation 4-/5 tender 4-/5 pain  (Blank rows = not tested)  HAND FUNCTION: 04/25/23: Grip strength Left: 28 lbs   04/07/23:Grip strength Right: 50 lbs, Left: 16 lbs    COORDINATION: Eval: Observed coordination impairments with affected Lt hand. Details TBD as needed for baseline  EDEMA:   Eval:  Mildly swollen  in Lt hand and wrist today compared to Rt side  OBSERVATIONS:   Eval: She has much limited range of motion in the left wrist compared to the right.  She has some pain while performing these range of motion's, and she should not be lifting weights yet.  She has apparent weakness of grip when squeezing a towel.  She is no overt instability of the left wrist, no significant tenderness palpation over the volar or dorsal side of the radius or ulna, no DRUJ instability today.    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  (Flexion contracture of the PIP joint)   TODAY'S TREATMENT:  04/25/23: She performs active range of motion for exercises as well as new measures which show some mixed progress-she has some decreased motion now in the wrist likely from not doing her stretches.  She is stronger now and her grip strength is better likely from staying very active which is a positive note.  OT discusses this with her and highly recommend stretching for pain and stiffness and if she wishes to improve her ability at home, with leisure, and with working out or weightlifting.  OT does upgrade her home exercise program to add strengthening now, also simplifying her program as she had some confusion with that and has not ever performed it consistently as asked.  These are performed with her multiple times with her performing back to show understanding and she does need several attempts with each.  Exercises - Seated Wrist Flexion Stretch  - 3-4 x daily - 3 reps - 15 second  hold - Wrist Prayer Stretch  - 4 x daily - 3-5 reps - 15 sec hold - Hammer Stretch or Strength  (in 4 planes of motion)  - 2-4 x daily - 1-2 sets - 10-15 reps - Standing Bicep Curls with Resistance  - 2-3 x daily - 4-5 x weekly - 1-2 sets - 10-15 reps - Standing Tricep Extensions with Resistance  - 2-3 x daily - 4-5 x weekly - 1-2 sets - 10-15 reps   PATIENT EDUCATION: Education details: See tx  section above for details  Person educated: Patient Education method: Engineer, structural, Teach back, Handouts  Education comprehension: States and demonstrates understanding, Additional Education required    HOME EXERCISE PROGRAM: Access Code: [email or web address] URL: [email or web address] Date: 04/25/2023 Prepared by: Melvenia Ada    GOALS: Goals reviewed with patient? Yes   SHORT TERM GOALS: (STG required if POC>30 days) Target Date: 04/18/23  Pt will obtain protective, custom orthotic. Goal status: 04/25/23: N/A D/C  2.  Pt will demo/state understanding of initial HEP to improve pain levels and prerequisite motion. Goal status: 04/25/23: Partially MET- was not performing and was given a new plan today    LONG TERM GOALS: Target Date: 05/16/23  Pt will improve functional ability by decreased impairment per Quick DASH / PSFS / PRWE assessment from 11% to 6% or better, for better quality of life. Goal status: INITIAL  2.  Pt will improve grip strength in Lt hand from painful when squeezing a towel to at least 30lbs for functional use at home and in IADLs. Goal status: INITIAL  3.  Pt will improve A/ROM in Lt wrist flex/ext from 19*/52* to at least 60* in both motions, to have functional motion for tasks like reach and grasp.  Goal status: INITIAL  4.  Pt will improve strength in Lt wrist from tender 4-/5 MMT to at least 4+/5 MMT to have increased functional ability to carry out selfcare and higher-level homecare tasks with no difficulty. Goal status: INITIAL    ASSESSMENT:  CLINICAL IMPRESSION: 04/25/23: She has not been attending therapy consistently, but she has not faring too poorly-so we discussed that she may not need to return if this home exercise program works for her and she has no lingering tightness or pain.  She states in agreement with this and will return as needed in the next 3 weeks.  If she does not return in that time.  She will be discharged from  therapy assumed all her goals are met.   PLAN:  OT FREQUENCY: 1-2x/week (starting with 1x week unless not making gains toward goals)   OT DURATION: 6 weeks  PLANNED INTERVENTIONS: self care/ADL training, therapeutic exercise, therapeutic activity, neuromuscular re-education, manual therapy, passive range of motion, splinting, ultrasound, fluidotherapy, compression bandaging, moist heat, cryotherapy, contrast bath, patient/family education, coping strategies training, DME and/or AE instructions, and Dry needling  CONSULTED AND AGREED WITH PLAN OF CARE: Patient  PLAN FOR NEXT SESSION:  Sh may return in the next 3 weeks as needed.  If she does not return by the July 19 she will be discharged from this therapy plan of care   Melvenia Ada, OTR/L, CHT 04/25/2023, 12:29 PM

## 2023-04-25 ENCOUNTER — Ambulatory Visit (INDEPENDENT_AMBULATORY_CARE_PROVIDER_SITE_OTHER): Payer: 59 | Admitting: Rehabilitative and Restorative Service Providers"

## 2023-04-25 ENCOUNTER — Encounter: Payer: Self-pay | Admitting: Rehabilitative and Restorative Service Providers"

## 2023-04-25 DIAGNOSIS — M25632 Stiffness of left wrist, not elsewhere classified: Secondary | ICD-10-CM | POA: Diagnosis not present

## 2023-04-25 DIAGNOSIS — R278 Other lack of coordination: Secondary | ICD-10-CM

## 2023-04-25 DIAGNOSIS — M25532 Pain in left wrist: Secondary | ICD-10-CM | POA: Diagnosis not present

## 2023-04-25 DIAGNOSIS — M6281 Muscle weakness (generalized): Secondary | ICD-10-CM | POA: Diagnosis not present

## 2023-05-06 ENCOUNTER — Encounter: Payer: 59 | Admitting: Rehabilitative and Restorative Service Providers"

## 2024-04-20 ENCOUNTER — Other Ambulatory Visit: Payer: Self-pay | Admitting: Physician Assistant

## 2024-04-20 ENCOUNTER — Encounter: Payer: Self-pay | Admitting: Physician Assistant

## 2024-04-20 DIAGNOSIS — K7689 Other specified diseases of liver: Secondary | ICD-10-CM

## 2024-04-27 ENCOUNTER — Ambulatory Visit
Admission: RE | Admit: 2024-04-27 | Discharge: 2024-04-27 | Disposition: A | Discharge status: Home / Self Care | Source: Ambulatory Visit | Attending: Physician Assistant | Admitting: Physician Assistant

## 2024-04-27 DIAGNOSIS — K7689 Other specified diseases of liver: Secondary | ICD-10-CM

## 2024-04-27 MED ORDER — GADOPICLENOL 0.5 MMOL/ML IV SOLN
5.0000 mL | Freq: Once | INTRAVENOUS | Status: AC | PRN
  Administered 2024-04-27: 5 mL via INTRAVENOUS
